# Patient Record
Sex: Female | Born: 1960 | Race: White | Hispanic: No | Marital: Married | Smoking: Former smoker
Health system: Southern US, Community
[De-identification: ages and names within clinical notes are randomized; demographics above are authoritative.]

## PROBLEM LIST (undated history)

## (undated) DIAGNOSIS — Z8719 Personal history of other diseases of the digestive system: Secondary | ICD-10-CM

## (undated) DIAGNOSIS — R42 Dizziness and giddiness: Secondary | ICD-10-CM

## (undated) DIAGNOSIS — F32A Depression, unspecified: Secondary | ICD-10-CM

## (undated) DIAGNOSIS — F419 Anxiety disorder, unspecified: Secondary | ICD-10-CM

## (undated) DIAGNOSIS — E669 Obesity, unspecified: Secondary | ICD-10-CM

## (undated) DIAGNOSIS — D649 Anemia, unspecified: Secondary | ICD-10-CM

## (undated) DIAGNOSIS — K219 Gastro-esophageal reflux disease without esophagitis: Secondary | ICD-10-CM

## (undated) DIAGNOSIS — I10 Essential (primary) hypertension: Secondary | ICD-10-CM

## (undated) DIAGNOSIS — D509 Iron deficiency anemia, unspecified: Secondary | ICD-10-CM

## (undated) DIAGNOSIS — F329 Major depressive disorder, single episode, unspecified: Secondary | ICD-10-CM

## (undated) HISTORY — DX: Anemia, unspecified: D64.9

## (undated) HISTORY — DX: Obesity, unspecified: E66.9

## (undated) HISTORY — DX: Essential (primary) hypertension: I10

## (undated) HISTORY — DX: Depression, unspecified: F32.A

## (undated) HISTORY — DX: Major depressive disorder, single episode, unspecified: F32.9

## (undated) HISTORY — DX: Anxiety disorder, unspecified: F41.9

## (undated) HISTORY — PX: OTHER SURGICAL HISTORY: SHX169

## (undated) HISTORY — PX: ENDOMETRIAL ABLATION W/ NOVASURE: SUR434

---
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
## 1992-03-15 DIAGNOSIS — N823 Fistula of vagina to large intestine: Secondary | ICD-10-CM

## 1992-03-15 HISTORY — PX: VESICO-VAGINAL FISTULA REPAIR: SHX5129

## 1992-03-15 HISTORY — DX: Fistula of vagina to large intestine: N82.3

## 1997-03-15 HISTORY — PX: RECTO-VAGINAL FISSURE REPAIR: SHX5277

## 2002-08-14 HISTORY — PX: OVARIAN CYST SURGERY: SHX726

## 2004-06-17 ENCOUNTER — Ambulatory Visit: Payer: Self-pay | Admitting: Family Medicine

## 2005-05-13 DIAGNOSIS — I10 Essential (primary) hypertension: Secondary | ICD-10-CM

## 2005-05-13 HISTORY — DX: Essential (primary) hypertension: I10

## 2005-06-11 ENCOUNTER — Ambulatory Visit: Payer: Self-pay | Admitting: Family Medicine

## 2005-06-29 ENCOUNTER — Ambulatory Visit: Payer: Self-pay | Admitting: Family Medicine

## 2006-02-08 ENCOUNTER — Encounter: Payer: Self-pay | Admitting: Family Medicine

## 2006-02-16 ENCOUNTER — Encounter: Payer: Self-pay | Admitting: Family Medicine

## 2006-02-16 ENCOUNTER — Ambulatory Visit: Payer: Self-pay | Admitting: Gastroenterology

## 2006-03-29 ENCOUNTER — Ambulatory Visit: Payer: Self-pay | Admitting: Gastroenterology

## 2007-10-14 LAB — CONVERTED CEMR LAB: Pap Smear: NORMAL

## 2008-01-15 ENCOUNTER — Ambulatory Visit: Payer: Self-pay | Admitting: Family Medicine

## 2008-01-15 DIAGNOSIS — I10 Essential (primary) hypertension: Secondary | ICD-10-CM | POA: Insufficient documentation

## 2008-02-07 ENCOUNTER — Ambulatory Visit: Payer: Self-pay | Admitting: Family Medicine

## 2008-02-16 LAB — CONVERTED CEMR LAB
ALT: 15 units/L (ref 0–35)
AST: 15 units/L (ref 0–37)
Albumin: 4.1 g/dL (ref 3.5–5.2)
Alkaline Phosphatase: 65 units/L (ref 39–117)
BUN: 14 mg/dL (ref 6–23)
Basophils Absolute: 0.1 10*3/uL (ref 0.0–0.1)
Basophils Relative: 0.9 % (ref 0.0–3.0)
Bilirubin, Direct: 0.1 mg/dL (ref 0.0–0.3)
CO2: 30 meq/L (ref 19–32)
Calcium: 9.1 mg/dL (ref 8.4–10.5)
Chloride: 106 meq/L (ref 96–112)
Cholesterol: 179 mg/dL (ref 0–200)
Creatinine, Ser: 0.9 mg/dL (ref 0.4–1.2)
Eosinophils Absolute: 0.1 10*3/uL (ref 0.0–0.7)
Eosinophils Relative: 1.7 % (ref 0.0–5.0)
GFR calc Af Amer: 87 mL/min
GFR calc non Af Amer: 72 mL/min
Glucose, Bld: 94 mg/dL (ref 70–99)
HCT: 37 % (ref 36.0–46.0)
HDL: 80.4 mg/dL (ref >39.0)
Hemoglobin: 12.9 g/dL (ref 12.0–15.0)
LDL Cholesterol: 91 mg/dL (ref 0–99)
Lymphocytes Relative: 27.1 % (ref 12.0–46.0)
MCHC: 34.8 g/dL (ref 30.0–36.0)
MCV: 86.8 fL (ref 78.0–100.0)
Monocytes Absolute: 0.3 10*3/uL (ref 0.1–1.0)
Monocytes Relative: 5.3 % (ref 3.0–12.0)
Neutro Abs: 4 10*3/uL (ref 1.4–7.7)
Neutrophils Relative %: 65 % (ref 43.0–77.0)
Platelets: 241 10*3/uL (ref 150–400)
Potassium: 3.9 meq/L (ref 3.5–5.1)
RBC: 4.27 M/uL (ref 3.87–5.11)
RDW: 12.9 % (ref 11.5–14.6)
Sodium: 142 meq/L (ref 135–145)
TSH: 2.02 microintl units/mL (ref 0.35–5.50)
Total Bilirubin: 0.7 mg/dL (ref 0.3–1.2)
Total CHOL/HDL Ratio: 2.2
Total Protein: 7.2 g/dL (ref 6.0–8.3)
Triglycerides: 37 mg/dL (ref 0–149)
VLDL: 7 mg/dL (ref 0–40)
WBC: 6.2 10*3/uL (ref 4.5–10.5)

## 2008-02-28 ENCOUNTER — Ambulatory Visit: Payer: Self-pay | Admitting: Family Medicine

## 2008-02-28 DIAGNOSIS — F341 Dysthymic disorder: Secondary | ICD-10-CM | POA: Insufficient documentation

## 2008-03-15 HISTORY — PX: HYSTEROSCOPY W/ ENDOMETRIAL ABLATION: SUR665

## 2008-05-29 ENCOUNTER — Ambulatory Visit: Payer: Self-pay | Admitting: Family Medicine

## 2008-12-02 ENCOUNTER — Ambulatory Visit: Payer: Self-pay | Admitting: Family Medicine

## 2010-01-20 ENCOUNTER — Ambulatory Visit: Payer: Self-pay | Admitting: Family Medicine

## 2010-02-12 ENCOUNTER — Encounter: Payer: Self-pay | Admitting: Family Medicine

## 2010-03-30 ENCOUNTER — Other Ambulatory Visit: Payer: Self-pay | Admitting: Family Medicine

## 2010-03-30 ENCOUNTER — Ambulatory Visit
Admission: RE | Admit: 2010-03-30 | Discharge: 2010-03-30 | Payer: Self-pay | Source: Home / Self Care | Attending: Family Medicine | Admitting: Family Medicine

## 2010-03-30 ENCOUNTER — Telehealth (INDEPENDENT_AMBULATORY_CARE_PROVIDER_SITE_OTHER): Payer: Self-pay | Admitting: *Deleted

## 2010-03-30 LAB — LIPID PANEL
Cholesterol: 176 mg/dL (ref 0–200)
HDL: 68.2 mg/dL (ref >39.00)
LDL Cholesterol: 92 mg/dL (ref 0–99)
Total CHOL/HDL Ratio: 3
Triglycerides: 78 mg/dL (ref 0.0–149.0)
VLDL: 15.6 mg/dL (ref 0.0–40.0)

## 2010-03-30 LAB — HEPATIC FUNCTION PANEL
ALT: 14 U/L (ref 0–35)
AST: 18 U/L (ref 0–37)
Albumin: 3.8 g/dL (ref 3.5–5.2)
Alkaline Phosphatase: 79 U/L (ref 39–117)
Bilirubin, Direct: 0.1 mg/dL (ref 0.0–0.3)
Total Bilirubin: 0.5 mg/dL (ref 0.3–1.2)
Total Protein: 6.4 g/dL (ref 6.0–8.3)

## 2010-03-30 LAB — CBC WITH DIFFERENTIAL/PLATELET
Basophils Absolute: 0.1 10*3/uL (ref 0.0–0.1)
Basophils Relative: 0.7 % (ref 0.0–3.0)
Eosinophils Absolute: 0.2 10*3/uL (ref 0.0–0.7)
Eosinophils Relative: 2.6 % (ref 0.0–5.0)
HCT: 33.9 % — ABNORMAL LOW (ref 36.0–46.0)
Hemoglobin: 11.3 g/dL — ABNORMAL LOW (ref 12.0–15.0)
Lymphocytes Relative: 25.1 % (ref 12.0–46.0)
Lymphs Abs: 2.1 10*3/uL (ref 0.7–4.0)
MCHC: 33.2 g/dL (ref 30.0–36.0)
MCV: 84.9 fl (ref 78.0–100.0)
Monocytes Absolute: 0.4 10*3/uL (ref 0.1–1.0)
Monocytes Relative: 4.6 % (ref 3.0–12.0)
Neutro Abs: 5.7 10*3/uL (ref 1.4–7.7)
Neutrophils Relative %: 67 % (ref 43.0–77.0)
Platelets: 365 10*3/uL (ref 150.0–400.0)
RBC: 3.99 Mil/uL (ref 3.87–5.11)
RDW: 13.7 % (ref 11.5–14.6)
WBC: 8.4 10*3/uL (ref 4.5–10.5)

## 2010-03-30 LAB — BASIC METABOLIC PANEL
BUN: 13 mg/dL (ref 6–23)
CO2: 26 mEq/L (ref 19–32)
Calcium: 9.1 mg/dL (ref 8.4–10.5)
Chloride: 104 mEq/L (ref 96–112)
Creatinine, Ser: 0.9 mg/dL (ref 0.4–1.2)
GFR: 67.8 mL/min (ref >60.00)
Glucose, Bld: 104 mg/dL — ABNORMAL HIGH (ref 70–99)
Potassium: 4.1 mEq/L (ref 3.5–5.1)
Sodium: 138 mEq/L (ref 135–145)

## 2010-03-30 LAB — TSH: TSH: 1.73 u[IU]/mL (ref 0.35–5.50)

## 2010-04-16 NOTE — Progress Notes (Signed)
----   Converted from flag ---- ---- 03/29/2010 4:05 PM, Judith Part MD wrote: please check lipid and wellness for v70.0 and 401.1 thanks  ---- 03/26/2010 10:47 AM, Liane Comber CMA (AAMA) wrote: Lab orders please! Good Morning! This pt is scheduled for cpx labs Monday, which labs to draw and dx codes to use? Thanks Tasha ------------------------------

## 2010-04-22 ENCOUNTER — Encounter: Payer: Self-pay | Admitting: Family Medicine

## 2010-04-22 ENCOUNTER — Encounter (INDEPENDENT_AMBULATORY_CARE_PROVIDER_SITE_OTHER): Payer: 59 | Admitting: Family Medicine

## 2010-04-22 DIAGNOSIS — D126 Benign neoplasm of colon, unspecified: Secondary | ICD-10-CM

## 2010-04-22 DIAGNOSIS — Z0000 Encounter for general adult medical examination without abnormal findings: Secondary | ICD-10-CM

## 2010-04-22 DIAGNOSIS — D649 Anemia, unspecified: Secondary | ICD-10-CM

## 2010-04-22 DIAGNOSIS — I10 Essential (primary) hypertension: Secondary | ICD-10-CM

## 2010-04-22 DIAGNOSIS — F341 Dysthymic disorder: Secondary | ICD-10-CM

## 2010-04-30 ENCOUNTER — Other Ambulatory Visit: Payer: Self-pay | Admitting: Family Medicine

## 2010-04-30 ENCOUNTER — Other Ambulatory Visit: Payer: 59

## 2010-04-30 ENCOUNTER — Encounter (INDEPENDENT_AMBULATORY_CARE_PROVIDER_SITE_OTHER): Payer: Self-pay | Admitting: *Deleted

## 2010-04-30 DIAGNOSIS — D126 Benign neoplasm of colon, unspecified: Secondary | ICD-10-CM

## 2010-04-30 DIAGNOSIS — D649 Anemia, unspecified: Secondary | ICD-10-CM

## 2010-04-30 NOTE — Letter (Signed)
Summary: [facility]: Immunoassay Fecal Occult Blood (iFOB) Order Form  [place] [facility]  [address] [place], [place] [address]   Phone: [phone]  Fax: [phone]      [facility]: Immunoassay Fecal Occult Blood (iFOB) Order Form   April 22, 2010 MRN: [id number]   Meagan Conway September 02, 1960   Physicican Name:___________Tower______________  Diagnosis Code:___________colon polyp history , anemia 285.9_______________      Judith Part MD

## 2010-05-01 ENCOUNTER — Encounter (INDEPENDENT_AMBULATORY_CARE_PROVIDER_SITE_OTHER): Payer: Self-pay | Admitting: *Deleted

## 2010-05-06 NOTE — Assessment & Plan Note (Signed)
Summary: CPX/ CLE   Vital Signs:  Patient profile:   50 year old female Height:      64.75 inches Weight:      225 pounds BMI:     37.87 Temp:     98.7 degrees F oral Pulse rate:   72 / minute Pulse rhythm:   regular BP sitting:   110 / 64  (left arm) Cuff size:   regular  Vitals Entered By: Lewanda Rife LPN (April 22, 2010 2:22 PM) CC: CPX GYN does pap and breast exam, Back Pain   History of Present Illness: here for wellness exam and to review chronic health problems   had a root canal this am  has headache from that  other than that is doing ok   HTN is stable and well controlled 110/64  gyn exam- Dr Shawnie Pons dec  pap nl   mam-- dec was nl   colonosc 08- polyp and fistula  no more problems with fistula   Td she thinks 11/15/2006  lipids good Last Lipid ProfileCholesterol: 176 (03/30/2010 9:18:40 AM)HDL:  68.20 (03/30/2010 9:18:40 AM)LDL:  92 (03/30/2010 9:18:40 AM)Triglycerides:  Last Liver profileSGOT:  18 (03/30/2010 9:18:40 AM)SPGT:  14 (03/30/2010 9:18:40 AM)T. Bili:  0.5 (03/30/2010 9:18:40 AM)Alk Phos:  79 (03/30/2010 9:18:40 AM)   sugar 104 no thirst or urination  is using herbalife with neighbors  - is careful with this  is not exercising   hb is 11.3 - down a pt from previous  has never been anemic  gave blood in nov  no more peroids  had novosure proceedure over a year ago - no peroids    Allergies (verified): No Known Drug Allergies  Past History:  Past Surgical History: Last updated: 01/15/2008 1994 vaginal fistula repair 08/2002 ovarian cyst 12/2004colonoscopy- fistula (rectal) and polyps  Family History: Last updated: 01/15/2008 mother HTN  father HTN, MI at 3 and ETOH  GF- both CAD   Social History: Last updated: 01/15/2008 1 drink 3-4 times per week non smoker   Past Medical History: (3/07) HTN depression with anxious features obesity does donate blood   Review of Systems General:  Denies chills, fatigue, fever, loss of  appetite, and malaise. Eyes:  Denies blurring and double vision. CV:  Denies chest pain or discomfort, palpitations, and shortness of breath with exertion. Resp:  Denies cough and wheezing. GI:  Denies abdominal pain, change in bowel habits, indigestion, and nausea. GU:  Denies dysuria and urinary frequency. MS:  Denies joint pain, muscle aches, and stiffness. Derm:  Denies itching, lesion(s), poor wound healing, and rash. Neuro:  Complains of headaches; denies poor balance, sensation of room spinning, and tingling. Psych:  Denies anxiety and depression. Endo:  Denies cold intolerance, excessive thirst, excessive urination, and heat intolerance. Heme:  Denies abnormal bruising and bleeding.  Physical Exam  General:  overweight but generally well appearing  Head:  normocephalic, atraumatic, and no abnormalities observed.   Eyes:  vision grossly intact, pupils equal, pupils round, and pupils reactive to light.  no conjunctival pallor, injection or icterus  Ears:  R ear normal and L ear normal.   Nose:  no nasal discharge.   Mouth:  pharynx pink and moist.   Neck:  supple with full rom and no masses or thyromegally, no JVD or carotid bruit  Chest Wall:  No deformities, masses, or tenderness noted. Lungs:  Normal respiratory effort, chest expands symmetrically. Lungs are clear to auscultation, no crackles or wheezes. Heart:  Normal  rate and regular rhythm. S1 and S2 normal without gallop, murmur, click, rub or other extra sounds. Abdomen:  Bowel sounds positive,abdomen soft and non-tender without masses, organomegaly or hernias noted. no renal bruits  Msk:  No deformity or scoliosis noted of thoracic or lumbar spine.  no acute joint changes  Pulses:  R and L carotid,radial,femoral,dorsalis pedis and posterior tibial pulses are full and equal bilaterally Extremities:  No clubbing, cyanosis, edema, or deformity noted with normal full range of motion of all joints.   Neurologic:  sensation  intact to light touch, gait normal, and DTRs symmetrical and normal.   Skin:  Intact without suspicious lesions or rashes no pallor or jaundice nl cap refill Cervical Nodes:  No lymphadenopathy noted Inguinal Nodes:  No significant adenopathy Psych:  normal affect, talkative and pleasant    Impression & Recommendations:  Problem # 1:  HEALTH MAINTENANCE EXAM (ICD-V70.0) Assessment Comment Only reviewed health habits including diet, exercise and skin cancer prevention reviewed health maintenance list and family history   Problem # 2:  COLONIC POLYPS (ICD-211.3) Assessment: New hx of  now is anemic  sent for last colonosc- ? due for next  Problem # 3:  ANXIETY DEPRESSION (ICD-300.4) Assessment: Improved doing better off fluoxetine  will continue to monitor   Problem # 4:  ESSENTIAL HYPERTENSION (ICD-401.9) Assessment: Unchanged  HTN is very well controlled refilled ace/ hct  disc healthy diet (low simple sugar/ choose complex carbs/ low sat fat) diet and exercise in detail - disc plan for wt loss  Her updated medication list for this problem includes:    Zestoretic 10-12.5 Mg Tabs (Lisinopril-hydrochlorothiazide) .Marland Kitchen... 1 by mouth once daily in am  BP today: 110/64 Prior BP: 120/82 (12/02/2008)  Labs Reviewed: K+: 4.1 (03/30/2010) Creat: : 0.9 (03/30/2010)   Chol: 176 (03/30/2010)   HDL: 68.20 (03/30/2010)   LDL: 92 (03/30/2010)   TG: 78.0 (03/30/2010)  Orders: Prescription Created Electronically [phone])  Problem # 5:  OBESITY (ICD-278.00) Assessment: Unchanged I recommend wt watchers and 5 d exercise weekly instead of supplements or herbalife  Problem # 6:  UNSPECIFIED ANEMIA (ICD-285.9) anemia -- new and mild  suspect may be rel to blood donation  also hx of colon polyps- sent for last colonosc will do immunoassay card  then start ferrous sulfate 325 daily and re check cbc in 6 wk  Complete Medication List: 1)  Zestoretic 10-12.5 Mg Tabs  (Lisinopril-hydrochlorothiazide) .Marland Kitchen.. 1 by mouth once daily in am  Patient Instructions: 1)  please send for last colonosc and note -- [facility] - Dr ? Lars Pinks  2)  do stool card and get back to [place]  3)  then start ferrous sulfate (iron ) appox 325 mg daily over the counter for anemia (you may need a stool softener with this - that is ok )  4)  re check cbc with diff in 6 weeks for anemia (285.9) Prescriptions: ZESTORETIC 10-12.5 MG TABS (LISINOPRIL-HYDROCHLOROTHIAZIDE) 1 by mouth once daily in am  #30 x 11   Entered and Authorized by:   Judith Part MD   Signed by:   Judith Part MD on 04/22/2010   Method used:   Electronically to        [facility] (retail)       [address], [address]       [place], [place]  [address]       Ph: [phone]  Fax: [phone]   RxID:   [id number]    Orders Added: 1)  Prescription Created Electronically [G8553] 2)  Est. Patient 40-64 years [30865] 3)  Est. Patient Level II [78469]    Current Allergies (reviewed today): No known allergies    Preventive Care Screening  Mammogram:    Date:  02/12/2010    Results:  normal   Pap Smear:    Date:  02/12/2010    Results:  normal   Last Tetanus Booster:    Date:  11/15/2006    Results:  Td

## 2010-05-06 NOTE — Letter (Signed)
Summary: Generic Letter  [place] at [facility]  [address] [place], [place] [address]   Phone: [phone]  Fax: [phone]    05/01/2010    ANAIA FRITH [address] [place], [place]  [address]  [place]     Dear Ms. Spiers,    Your stool test were normal. Please take Iron 325mg  daily as instructed by Dr. Milinda Antis for anemia. You may need to take a stool softner with this supplement.       Sincerely,   Liane Comber CMA (AAMA)

## 2010-05-12 NOTE — Letter (Signed)
Summary: Midge Minium MD  Midge Minium MD   Imported By: Lanelle Bal 04/29/2010 13:52:53  _____________________________________________________________________  External Attachment:    Type:   Image     Comment:   External Document

## 2010-05-12 NOTE — Procedures (Signed)
Summary: Colonoscopy/[facility]  Colonoscopy/[facility]   Imported By: Lanelle Bal 04/29/2010 13:49:18  _____________________________________________________________________  External Attachment:    Type:   Image     Comment:   External Document

## 2010-06-03 ENCOUNTER — Other Ambulatory Visit: Payer: 59

## 2010-06-03 DIAGNOSIS — D649 Anemia, unspecified: Secondary | ICD-10-CM

## 2010-06-04 LAB — CBC WITH DIFFERENTIAL/PLATELET
Basophils Absolute: 0.1 10*3/uL (ref 0.0–0.1)
HCT: 33.4 % — ABNORMAL LOW (ref 36.0–46.0)
Hemoglobin: 11.3 g/dL — ABNORMAL LOW (ref 12.0–15.0)
Lymphs Abs: 1.9 10*3/uL (ref 0.7–4.0)
MCHC: 33.9 g/dL (ref 30.0–36.0)
Monocytes Relative: 3.1 % (ref 3.0–12.0)
Neutro Abs: 6.8 10*3/uL (ref 1.4–7.7)
RDW: 16.3 % — ABNORMAL HIGH (ref 11.5–14.6)

## 2010-07-28 NOTE — Assessment & Plan Note (Signed)
NAME:  Meagan Conway, Meagan Conway NO.:  [id number]   MEDICAL RECORD NO.:  [id number]          PATIENT TYPE:  POB   LOCATION:  [facility]         FACILITY:  [facility]   PHYSICIAN:  Tinnie Gens, MD        DATE OF BIRTH:  01/20/1961   DATE OF SERVICE:                                  CLINIC NOTE   CHIEF COMPLAINT:  Physical exam.   HISTORY OF PRESENT ILLNESS:  The patient is a 50 year old gravida 2,  para 2 who has a history of hypertension who sees Dr. Milinda Antis as her PCP.  She comes in today for physical exam.  She had a NovaSure endometrial  ablation in 2010 and has had no cycle since then.  She has no other  complaints today.  Her past medical history is significant for acid  reflux and hypertension.   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  She continues to have issues with this once in a  while.  The patient is on lisinopril and hydrochlorothiazide combination  10/12.5 mg p.o. daily and fluoxetine 40 mg p.o. daily.   ALLERGIES:  None known.   OBSTETRICAL HISTORY:  G2, P2, two vaginal deliveries.  She has a child  of 9 and one of 13.   GYN HISTORY:  Menarche at age 8.  No cycle since NovaSure, November  2010.  Her husband had vasectomy.  She has a history of ovarian cyst,  but no history of abnormal Pap smears.  Last Pap was in August 2010.   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  She lives with her husband and her 18 year old son.  She  drinks approximately 4 alcoholic beverages per week.  She used marijuana  in high school and has not used any other drugs since then.  She denies  tobacco use.   FAMILY HISTORY:  Significant for diabetes for heart disease and  hypertension.   REVIEW OF SYSTEMS:  A 14-point review of systems is reviewed.  She  complains of headaches on occasion, which is  relieved with ibuprofen.  Please see GYN history on the chart for full details.  The patient does  complain of weight gain and would like to know if there is a magic diet  pill.   PHYSICAL EXAMINATION:  GENERAL:  Today, she is an obese female in no  acute distress.  VITAL SIGNS:  Blood pressure is 123/85, pulse is 61, weight is 225  pounds.  HEENT:  Normocephalic, atraumatic.  Sclerae anicteric.  NECK:  Supple.  Normal thyroid.  LUNGS:  Clear bilaterally.  CV:  Regular rate and rhythm without rubs, gallops, or murmurs.  ABDOMEN:  Soft, nontender, and nondistended.  EXTREMITIES:  No cyanosis, clubbing, or edema.  Distal pulses 2+.  BREASTS:  Symmetric with everted nipples.  No masses.  No  supraclavicular or axillary adenopathy.  GU:  Normal external female genitalia.  BUS is normal.  Vagina is pink  and rugated.  Cervix is parous without lesion.  Uterus is small,  anteverted.  No adnexal mass or tenderness, although exam is somewhat  limited by body habitus.   IMPRESSION:  1. Yearly exam with physical.  2. Obesity.  3. Hypertension.  4. Depression.  5. History of rectovaginal fistula.   PLAN:  1. Pap smear today.  2. Order mammogram screening.  3. Continue followup with Dr. Milinda Antis for all PCP needs.  4. Discussed exercise, diet, portion control, and making healthier      substitutions.  5. The patient will follow up in 1 year for her next annual exam.           ______________________________  Tinnie Gens, MD     TP/MEDQ  D:  01/20/2010  T:  01/21/2010  Job:  [id number]

## 2010-08-01 ENCOUNTER — Encounter: Payer: Self-pay | Admitting: Family Medicine

## 2010-08-12 ENCOUNTER — Encounter: Payer: Self-pay | Admitting: Family Medicine

## 2010-08-12 ENCOUNTER — Ambulatory Visit (INDEPENDENT_AMBULATORY_CARE_PROVIDER_SITE_OTHER): Payer: 59 | Admitting: Family Medicine

## 2010-08-12 VITALS — BP 136/84 | HR 64 | Temp 98.3°F | Ht 64.75 in | Wt 229.0 lb

## 2010-08-12 DIAGNOSIS — D649 Anemia, unspecified: Secondary | ICD-10-CM

## 2010-08-12 NOTE — Patient Instructions (Signed)
We will update you when your labs return  Continue iron and B12 for now  udpate me if any changes

## 2010-08-12 NOTE — Progress Notes (Signed)
Subjective:    Patient ID: Meagan Conway, female    DOB: [birth year], 50 y.o.   MRN: [id number]  HPI Here for f/u of anemia   Prev hb had gone from over 12 to 11.3 Started FeSo4 325 mg daily with some imp in fatigue Also takes 1000 mcg of B12 daily  Some days more energy than others  Lab Results  Component Value Date   WBC 9.2 06/03/2010   HGB 11.3* 06/03/2010   HCT 33.4* 06/03/2010   MCV 80.6 06/03/2010   PLT 333.0 06/03/2010    No blood donation since last November  Iron is not bothering her stomach  Also taking vit B12  Is eating a balanced diet  Stool card neg  colonosc 07  peroids -- gone - with novosure - none since nov 2010  Possible that her mother has had anemia   No abd pain or blood in stool  Dark stools from her iron   Nothing new medically   Past Medical History  Diagnosis Date  . Hypertension 05/2005  . Depression     with anxious features  . Obesity     History   Social History  . Marital Status: Married    Spouse Name: N/A    Number of Children: N/A  . Years of Education: N/A   Occupational History  . Not on file.   Social History Main Topics  . Smoking status: Former Games developer  . Smokeless tobacco: Not on file  . Alcohol Use: Yes     3-4 times per week  . Drug Use: Not on file  . Sexually Active: Not on file   Other Topics Concern  . Not on file   Social History Narrative  . No narrative on file       Review of Systems Review of Systems  Constitutional: Negative for fever, appetite change, and unexpected weight change. pos for intermittent fatigue Eyes: Negative for pain and visual disturbance.  Respiratory: Negative for cough and shortness of breath.   Cardiovascular: Negative for cp or palpitations or edema.   Gastrointestinal: Negative for nausea, diarrhea and constipation.  Genitourinary: Negative for urgency and frequency.  Skin: Negative for pallor.  Neurological: Negative for weakness, light-headedness, numbness and  headaches.  Hematological: Negative for adenopathy. Does not bruise/bleed easily.  Psychiatric/Behavioral: Negative for dysphoric mood. The patient is not nervous/anxious.          Objective:   Physical Exam  Constitutional: She appears well-developed and well-nourished. No distress.       Obese and well appearing   HENT:  Head: Normocephalic and atraumatic.  Mouth/Throat: Oropharynx is clear and moist.  Eyes: Conjunctivae and EOM are normal. Pupils are equal, round, and reactive to light.       No conj pallor  Neck: Normal range of motion. Neck supple. No JVD present. Carotid bruit is not present. No thyromegaly present.  Cardiovascular: Normal rate, regular rhythm, normal heart sounds and intact distal pulses.   Abdominal: Soft. Bowel sounds are normal. She exhibits no distension and no mass. There is no tenderness.       No HSM  Musculoskeletal: She exhibits no edema and no tenderness.  Lymphadenopathy:    She has no cervical adenopathy.  Neurological: She is alert. She has normal reflexes. Coordination normal.  Skin: Skin is warm and dry. No rash noted. No erythema. No pallor.  Psychiatric: She has a normal mood and affect.          Assessment &  Plan:

## 2010-08-13 LAB — CBC WITH DIFFERENTIAL/PLATELET
Basophils Absolute: 0.1 10*3/uL (ref 0.0–0.1)
Eosinophils Absolute: 0.1 10*3/uL (ref 0.0–0.7)
HCT: 36.4 % (ref 36.0–46.0)
Hemoglobin: 12.5 g/dL (ref 12.0–15.0)
Lymphs Abs: 2 10*3/uL (ref 0.7–4.0)
MCHC: 34.3 g/dL (ref 30.0–36.0)
MCV: 85.6 fl (ref 78.0–100.0)
Monocytes Absolute: 0.4 10*3/uL (ref 0.1–1.0)
Neutro Abs: 6.2 10*3/uL (ref 1.4–7.7)
Platelets: 283 10*3/uL (ref 150.0–400.0)
RDW: 17.5 % — ABNORMAL HIGH (ref 11.5–14.6)

## 2010-08-13 LAB — VITAMIN B12: Vitamin B-12: 777 pg/mL (ref 211–911)

## 2010-08-14 NOTE — Assessment & Plan Note (Signed)
Iron def and improved energy levels with daily iron suppl and B12 Check labs today  Update if changes Will continue iron for now

## 2010-11-23 ENCOUNTER — Other Ambulatory Visit (INDEPENDENT_AMBULATORY_CARE_PROVIDER_SITE_OTHER): Payer: 59

## 2010-11-23 DIAGNOSIS — D649 Anemia, unspecified: Secondary | ICD-10-CM

## 2010-11-24 LAB — CBC WITH DIFFERENTIAL/PLATELET
Eosinophils Absolute: 0.2 10*3/uL (ref 0.0–0.7)
Eosinophils Relative: 2 % (ref 0.0–5.0)
HCT: 39.2 % (ref 36.0–46.0)
Lymphs Abs: 2 10*3/uL (ref 0.7–4.0)
MCHC: 33.3 g/dL (ref 30.0–36.0)
MCV: 92 fl (ref 78.0–100.0)
Monocytes Absolute: 0.4 10*3/uL (ref 0.1–1.0)
Platelets: 279 10*3/uL (ref 150.0–400.0)
RDW: 13 % (ref 11.5–14.6)
WBC: 9.3 10*3/uL (ref 4.5–10.5)

## 2010-11-24 NOTE — Progress Notes (Signed)
Addended by: Baldomero Lamy on: 11/24/2010 08:31 AM   Modules accepted: Orders

## 2010-11-25 LAB — FERRITIN: Ferritin: 59.4 ng/mL (ref 10.0–291.0)

## 2011-02-16 ENCOUNTER — Encounter: Payer: Self-pay | Admitting: Family Medicine

## 2011-02-16 ENCOUNTER — Ambulatory Visit (INDEPENDENT_AMBULATORY_CARE_PROVIDER_SITE_OTHER): Payer: 59 | Admitting: Family Medicine

## 2011-02-16 VITALS — BP 127/70 | HR 67 | Ht 65.0 in | Wt 219.0 lb

## 2011-02-16 DIAGNOSIS — Z23 Encounter for immunization: Secondary | ICD-10-CM

## 2011-02-16 DIAGNOSIS — Z01419 Encounter for gynecological examination (general) (routine) without abnormal findings: Secondary | ICD-10-CM

## 2011-02-16 MED ORDER — INFLUENZA VIRUS VACC SPLIT PF IM SUSP: 0.5000 mL | Freq: Once | INTRAMUSCULAR | Status: DC

## 2011-02-16 NOTE — Patient Instructions (Signed)

## 2011-02-16 NOTE — Progress Notes (Signed)
  Subjective:     Meagan Conway is a 50 y.o. female and is here for a comprehensive physical exam. The patient reports no problems.  Has no cycle secondary to ablation.  Husband with vasectomy.  Has increasing heartburn.  Stopped her BP med in June, is watching it periodically and it has been normal.  History   Social History  . Marital Status: Married    Spouse Name: N/A    Number of Children: N/A  . Years of Education: N/A   Occupational History  . Not on file.   Social History Main Topics  . Smoking status: Former Games developer  . Smokeless tobacco: Not on file  . Alcohol Use: Yes     3-4 times per week  . Drug Use: No  . Sexually Active: Not on file   Other Topics Concern  . Not on file   Social History Narrative  . No narrative on file   Health Maintenance  Topic Date Due  . Pap Smear  02/07/1981  . Influenza Vaccine  12/14/2010  . Tetanus/tdap  11/14/2016    The following portions of the patient's history were reviewed and updated as appropriate: allergies, current medications, past family history, past medical history, past social history, past surgical history and problem list.  Review of Systems A comprehensive review of systems was negative.   Objective:    BP 127/70  Pulse 67  Ht 5\' 5"  (1.651 m)  Wt 219 lb (99.338 kg)  BMI 36.44 kg/m2 General appearance: alert, cooperative and appears stated age Head: Normocephalic, without obvious abnormality, atraumatic Neck: no adenopathy, supple, symmetrical, trachea midline and thyroid not enlarged, symmetric, no tenderness/mass/nodules Lungs: clear to auscultation bilaterally Breasts: normal appearance, no masses or tenderness Heart: regular rate and rhythm, S1, S2 normal, no murmur, click, rub or gallop Abdomen: soft, non-tender; bowel sounds normal; no masses,  no organomegaly Pelvic: cervix normal in appearance, external genitalia normal, no adnexal masses or tenderness, no cervical motion tenderness, uterus normal  size, shape, and consistency and vagina normal without discharge Extremities: extremities normal, atraumatic, no cyanosis or edema Pulses: 2+ and symmetric Skin: Skin color, texture, turgor normal. No rashes or lesions Lymph nodes: Cervical, supraclavicular, and axillary nodes normal. Neurologic: Grossly normal    Assessment:    Healthy female exam.  BP good off her BP meds GERD     Plan:  Mammogram Does not need pap smear again for 2 years. Flu shot today To see PCP about GERD.   See After Visit Summary for Counseling Recommendations

## 2011-03-10 ENCOUNTER — Encounter: Payer: Self-pay | Admitting: Family Medicine

## 2011-03-10 ENCOUNTER — Ambulatory Visit (INDEPENDENT_AMBULATORY_CARE_PROVIDER_SITE_OTHER): Payer: 59 | Admitting: Family Medicine

## 2011-03-10 DIAGNOSIS — K219 Gastro-esophageal reflux disease without esophagitis: Secondary | ICD-10-CM | POA: Insufficient documentation

## 2011-03-10 DIAGNOSIS — E669 Obesity, unspecified: Secondary | ICD-10-CM

## 2011-03-10 MED ORDER — PANTOPRAZOLE SODIUM 40 MG PO TBEC: 40.0000 mg | DELAYED_RELEASE_TABLET | Freq: Every day | ORAL | Status: DC

## 2011-03-10 NOTE — Patient Instructions (Signed)
Start protonix 40 mg each am  If insurance won't cover that we may need to change to generic prilosec  Avoid spicy food/ fatty food / caffiene/ alcohol  Also work on weight loss Do not eat late at night  You can also elevate head of bed with a few brick

## 2011-03-10 NOTE — Progress Notes (Signed)
Subjective:    Patient ID: Meagan Conway, female    DOB: [birth year], 50 y.o.   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 OBESITY  . ANXIETY DEPRESSION  . ESSENTIAL HYPERTENSION  . COLONIC POLYPS  . UNSPECIFIED ANEMIA  . GERD (gastroesophageal reflux disease)  . Obesity   Past Medical History  Diagnosis Date  . Hypertension 05/2005  . Depression     with anxious features  . Obesity    Past Surgical History  Procedure Date  . Vesico-vaginal fistula repair 1994  . Ovarian cyst surgery 08/2002  . Endometrial ablation w/ novasure    History  Substance Use Topics  . Smoking status: Former Games developer  . Smokeless tobacco: Not on file  . Alcohol Use: Yes     3-4 times per week   Family History  Problem Relation Age of Onset  . Hypertension Mother   . Hypertension Father   . Heart disease Father 62    MI  . Alcohol abuse Father   . Heart disease Maternal Grandmother   . Heart disease Paternal Grandmother    No Known Allergies No current outpatient prescriptions on file prior to visit.   Current Facility-Administered Medications on File Prior to Visit  Medication Dose Route Frequency Provider Last Rate Last Dose  . influenza  inactive virus vaccine (FLUZONE/FLUARIX) injection 0.5 mL  0.5 mL Intramuscular Once Reva Bores, MD             Review of Systems Review of Systems  Constitutional: Negative for fever, appetite change, fatigue and unexpected weight change.  Eyes: Negative for pain and visual disturbance.  ENT neg for sore throat  Respiratory: Negative for cough and shortness of breath.   Cardiovascular: Negative for cp or palpitations    Gastrointestinal: Negative for nausea, diarrhea and constipation. pos for heartburn and indigestion  Genitourinary: Negative for urgency and frequency.  Skin: Negative for pallor or rash   Neurological: Negative for weakness, light-headedness, numbness and headaches.  Hematological: Negative for adenopathy. Does not bruise/bleed easily.  Psychiatric/Behavioral: Negative for dysphoric mood. The patient is not nervous/anxious.          Objective:   Physical Exam  Constitutional: She appears well-developed and well-nourished. No distress.       overwt and well appearing   HENT:  Head: Normocephalic and atraumatic.  Mouth/Throat: Oropharynx is clear and moist.  Eyes: Conjunctivae and EOM are normal. Pupils are equal, round, and reactive to light. No scleral icterus.  Neck: Normal range of motion. Neck supple. No JVD present. Carotid bruit is not present. Erythema present. No thyromegaly present.  Cardiovascular: Normal rate, regular rhythm, normal heart sounds and intact distal pulses.   Pulmonary/Chest: Effort  normal and breath sounds normal. No respiratory distress. She has no wheezes.  Abdominal: Soft. Bowel sounds are normal. She exhibits no distension and no mass. There is no tenderness. There is no rebound and no guarding.  Musculoskeletal: Normal range of motion. She exhibits no edema.  Lymphadenopathy:    She has no cervical adenopathy.  Neurological: She is alert. She has normal reflexes. No cranial nerve deficit. She exhibits normal muscle tone. Coordination normal.  Skin: Skin is warm and dry. No rash noted. No erythema. No pallor.  Psychiatric: She has a  normal mood and affect.          Assessment & Plan:

## 2011-03-11 NOTE — Assessment & Plan Note (Signed)
Recent wt gain with poor diet and exercise  This has likely lead to the re occurrence of her GERD symptoms  Pt is motivated for lifestyle change Disc a detailed plan of reduced calories (through eliminating junk food/ better food choices/ portion control ), and exercise 5 d per week- elliptical and walking  Disc goal wt and other health reasons to get to a target weight

## 2011-03-11 NOTE — Assessment & Plan Note (Signed)
Re occuring due likely to wt gain and stress Will re start protonix - which has worked in past (consider omeprazole if ins does not cover this )  Disc diet changes and given handouts on this also  Will update if symptoms do not resolve within 2 wk  Will disc further at PE -- and make long term plan - with wt loss as well

## 2011-06-04 ENCOUNTER — Other Ambulatory Visit (INDEPENDENT_AMBULATORY_CARE_PROVIDER_SITE_OTHER): Payer: 59

## 2011-06-04 ENCOUNTER — Telehealth: Payer: Self-pay | Admitting: Family Medicine

## 2011-06-04 DIAGNOSIS — Z0000 Encounter for general adult medical examination without abnormal findings: Secondary | ICD-10-CM

## 2011-06-04 DIAGNOSIS — I10 Essential (primary) hypertension: Secondary | ICD-10-CM

## 2011-06-04 DIAGNOSIS — Z13220 Encounter for screening for lipoid disorders: Secondary | ICD-10-CM

## 2011-06-04 LAB — TSH: TSH: 1.12 u[IU]/mL (ref 0.35–5.50)

## 2011-06-04 LAB — COMPREHENSIVE METABOLIC PANEL
ALT: 13 U/L (ref 0–35)
CO2: 28 mEq/L (ref 19–32)
Creatinine, Ser: 0.8 mg/dL (ref 0.4–1.2)
GFR: 76.81 mL/min (ref >60.00)
Total Bilirubin: 0.3 mg/dL (ref 0.3–1.2)

## 2011-06-04 LAB — CBC WITH DIFFERENTIAL/PLATELET
Basophils Relative: 0.8 % (ref 0.0–3.0)
Eosinophils Relative: 2.2 % (ref 0.0–5.0)
Hemoglobin: 13.4 g/dL (ref 12.0–15.0)
Lymphocytes Relative: 26 % (ref 12.0–46.0)
MCHC: 33.8 g/dL (ref 30.0–36.0)
Monocytes Relative: 4.8 % (ref 3.0–12.0)
Neutro Abs: 4.8 10*3/uL (ref 1.4–7.7)
RBC: 4.44 Mil/uL (ref 3.87–5.11)
WBC: 7.2 10*3/uL (ref 4.5–10.5)

## 2011-06-04 NOTE — Telephone Encounter (Signed)
Pe labs

## 2011-06-07 ENCOUNTER — Other Ambulatory Visit: Payer: 59

## 2011-07-02 ENCOUNTER — Encounter: Payer: Self-pay | Admitting: Family Medicine

## 2011-07-02 ENCOUNTER — Ambulatory Visit (INDEPENDENT_AMBULATORY_CARE_PROVIDER_SITE_OTHER): Payer: 59 | Admitting: Family Medicine

## 2011-07-02 VITALS — BP 130/80 | HR 68 | Temp 98.3°F | Wt 221.0 lb

## 2011-07-02 DIAGNOSIS — F341 Dysthymic disorder: Secondary | ICD-10-CM

## 2011-07-02 DIAGNOSIS — E669 Obesity, unspecified: Secondary | ICD-10-CM

## 2011-07-02 DIAGNOSIS — Z0000 Encounter for general adult medical examination without abnormal findings: Secondary | ICD-10-CM

## 2011-07-02 DIAGNOSIS — I10 Essential (primary) hypertension: Secondary | ICD-10-CM

## 2011-07-02 DIAGNOSIS — R21 Rash and other nonspecific skin eruption: Secondary | ICD-10-CM

## 2011-07-02 DIAGNOSIS — D126 Benign neoplasm of colon, unspecified: Secondary | ICD-10-CM

## 2011-07-02 MED ORDER — FLUOXETINE HCL 20 MG PO TABS
20.0000 mg | ORAL_TABLET | Freq: Every day | ORAL | Status: DC
Start: 2011-07-02 — End: 2012-03-06

## 2011-07-02 NOTE — Patient Instructions (Signed)
Update me if or when you are ready to see a counselor  Start back on fluoxetine -- 1/2 pill daily for a week - then go up to one pill per day  If side effects- let me know  Keep exercising Take zyrtec otc 10 mg each evening - for allergies and itchy rash Also keep itchy areas as cool and dry as possible - use some otc cortisone cream (Update if not starting to improve in a week or if worsening  )  Labs are ok  bp is ok

## 2011-07-02 NOTE — Assessment & Plan Note (Addendum)
Mostly in warmer areas - under breasts and abdomen  Looks allergic-but anxiety could play role  Will try zyrtec otc cortisone cream  Keep cool Update

## 2011-07-02 NOTE — Assessment & Plan Note (Signed)
Still controlled off of med/ with exercise Will continue to monitor [facility] wellness labs

## 2011-07-02 NOTE — Assessment & Plan Note (Signed)
Discussed how this problem influences overall health and the risks it imposes  Reviewed plan for weight loss with lower calorie diet (via better food choices and also portion control or program like weight watchers) and exercise building up to or more than 30 minutes 5 days per week including some aerobic activity    

## 2011-07-02 NOTE — Assessment & Plan Note (Signed)
Worse lately- caring for aging mother with personality conflicts Asked her to consider counseling to help get perspecitve/ make a game plan  Also re start fluoxetine 20 mg  Update if worse or side eff F/u in about 3 mo

## 2011-07-02 NOTE — Progress Notes (Signed)
Subjective:    Patient ID: Meagan Conway, female    DOB: 14-Feb-1961, 51 y.o.   MRN: [id number]  HPI Here for health maintenance exam and to review chronic medical problems    Is feeling very anxious lately  Taking care of her mother - only child - she is having some health issues  Is not talking to anyone - never seen a counselor before  Is edgy and feels very stressed in general- especially when talking to her mother  Has dicey relationship going with her anyway Her symptoms are carried over to home as well  Was on fluoxetine in the past - has been over a year    bp is 130/80     Today No cp or palpitations or headaches or edema  Is controlled by lifestyle at this time  Wt is down 5 lb  Pap/gyn care -- has been last November - no abn paps , pap nl was the year before that  endom ablation in past- no menses   mammo 11/12- mammo - nl  Self exam - no lumps or changes   Is having hot flash  colonosc 12/07 polyp  Wellness labs ok  Lab Results  Component Value Date   CHOL 176 03/30/2010   HDL 68.20 03/30/2010   LDLCALC 92 03/30/2010   TRIG 78.0 03/30/2010   CHOLHDL 3 03/30/2010      Chemistry      Component Value Date/Time   NA 141 06/04/2011 0800   K 4.2 06/04/2011 0800   CL 103 06/04/2011 0800   CO2 28 06/04/2011 0800   BUN 16 06/04/2011 0800   CREATININE 0.8 06/04/2011 0800      Component Value Date/Time   CALCIUM 9.7 06/04/2011 0800   ALKPHOS 78 06/04/2011 0800   AST 14 06/04/2011 0800   ALT 13 06/04/2011 0800   BILITOT 0.3 06/04/2011 0800     Lab Results  Component Value Date   WBC 7.2 06/04/2011   HGB 13.4 06/04/2011   HCT 39.5 06/04/2011   MCV 89.0 06/04/2011   PLT 264.0 06/04/2011    Patient Active Problem List  Diagnoses  . ANXIETY DEPRESSION  . ESSENTIAL HYPERTENSION  . COLONIC POLYPS  . UNSPECIFIED ANEMIA  . GERD (gastroesophageal reflux disease)  . Obesity  . Routine general medical examination at a health care facility  . Rash and nonspecific skin  eruption   Past Medical History  Diagnosis Date  . Hypertension 05/2005  . Depression     with anxious features  . Obesity    Past Surgical History  Procedure Date  . Vesico-vaginal fistula repair 1994  . Ovarian cyst surgery 08/2002  . Endometrial ablation w/ novasure    History  Substance Use Topics  . Smoking status: Former Games developer  . Smokeless tobacco: Not on file  . Alcohol Use: Yes     3-4 times per week   Family History  Problem Relation Age of Onset  . Hypertension Mother   . Hypertension Father   . Heart disease Father 71    MI  . Alcohol abuse Father   . Heart disease Maternal Grandmother   . Heart disease Paternal Grandmother    No Known Allergies Current Outpatient Prescriptions on File Prior to Visit  Medication Sig Dispense Refill  . pantoprazole (PROTONIX) 40 MG tablet Take 1 tablet (40 mg total) by mouth daily.  30 tablet  11  . FLUoxetine (PROZAC) 20 MG tablet Take 1 tablet (20 mg total) by  mouth daily.  30 tablet  11      Review of Systems Review of Systems  Constitutional: Negative for fever, appetite change, fatigue and unexpected weight change.  Eyes: Negative for pain and visual disturbance.  Respiratory: Negative for cough and shortness of breath.   Cardiovascular: Negative for cp or palpitations    Gastrointestinal: Negative for nausea, diarrhea and constipation.  Genitourinary: Negative for urgency and frequency.  Skin: Negative for pallor or rash   Neurological: Negative for weakness, light-headedness, numbness and headaches.  Hematological: Negative for adenopathy. Does not bruise/bleed easily.  Psychiatric/Behavioral: pos for anx and dep symptoms with stressors         Objective:   Physical Exam  Constitutional: She appears well-developed and well-nourished. No distress.       Obese and well appearing   HENT:  Head: Normocephalic and atraumatic.  Right Ear: External ear normal.  Left Ear: External ear normal.  Nose: Nose  normal.  Mouth/Throat: Oropharynx is clear and moist. No oropharyngeal exudate.  Eyes: Conjunctivae and EOM are normal. Pupils are equal, round, and reactive to light. No scleral icterus.  Neck: Normal range of motion. Neck supple. No JVD present. No thyromegaly present.  Cardiovascular: Normal rate, regular rhythm, normal heart sounds and intact distal pulses.  Exam reveals no gallop.   Pulmonary/Chest: Effort normal and breath sounds normal. No respiratory distress. She has no wheezes.  Abdominal: Soft. Bowel sounds are normal. She exhibits no distension and no mass. There is no tenderness.  Musculoskeletal: Normal range of motion. She exhibits no edema and no tenderness.  Lymphadenopathy:    She has no cervical adenopathy.  Neurological: She is alert. She has normal reflexes. No cranial nerve deficit. She exhibits normal muscle tone. Coordination normal.  Skin: Skin is warm and dry. Rash noted. No pallor.       Allergic looking papular rash (papules of different sizes) on abd/ under breasts/ at small of back No excoriation or signs of infection  Psychiatric: She has a normal mood and affect.          Assessment & Plan:

## 2011-07-02 NOTE — Assessment & Plan Note (Signed)
Is up to date on colonosc at this time No stool changes

## 2011-07-02 NOTE — Assessment & Plan Note (Signed)
Reviewed health habits including diet and exercise and skin cancer prevention Also reviewed health mt list, fam hx and immunizations   Wellness labs reviewed  

## 2011-08-27 ENCOUNTER — Emergency Department: Payer: Self-pay | Admitting: Emergency Medicine

## 2011-10-01 ENCOUNTER — Encounter: Payer: Self-pay | Admitting: Family Medicine

## 2011-10-01 ENCOUNTER — Ambulatory Visit (INDEPENDENT_AMBULATORY_CARE_PROVIDER_SITE_OTHER): Payer: 59 | Admitting: Family Medicine

## 2011-10-01 VITALS — BP 138/80 | HR 60 | Temp 98.1°F | Ht 65.0 in | Wt 224.2 lb

## 2011-10-01 DIAGNOSIS — I10 Essential (primary) hypertension: Secondary | ICD-10-CM

## 2011-10-01 DIAGNOSIS — F341 Dysthymic disorder: Secondary | ICD-10-CM

## 2011-10-01 NOTE — Assessment & Plan Note (Signed)
Despite large amount of stress, mood is much improved with prozac - less anxious and no longer sad Will continue this dose as long as needed Enc water exercise and outdoor time when able

## 2011-10-01 NOTE — Assessment & Plan Note (Signed)
bp in fair control at this time  No changes needed  Disc lifstyle change with low sodium diet and exercise   Better on 2nd check  Needs to work on weight loss when able - limited exercise due to ankle sprain Will continue to swim
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
## 2011-10-01 NOTE — Patient Instructions (Addendum)
Take care of yourself  I'm glad you are doing better  Continue the prozac 20 mg bp was better on the 2nd try

## 2012-02-29 ENCOUNTER — Ambulatory Visit: Payer: 59 | Admitting: Family Medicine

## 2012-03-06 ENCOUNTER — Encounter: Payer: Self-pay | Admitting: Family Medicine

## 2012-03-06 ENCOUNTER — Ambulatory Visit (INDEPENDENT_AMBULATORY_CARE_PROVIDER_SITE_OTHER): Payer: 59 | Admitting: Family Medicine

## 2012-03-06 VITALS — BP 98/57 | HR 68 | Ht 65.0 in | Wt 234.0 lb

## 2012-03-06 DIAGNOSIS — Z124 Encounter for screening for malignant neoplasm of cervix: Secondary | ICD-10-CM

## 2012-03-06 DIAGNOSIS — Z01419 Encounter for gynecological examination (general) (routine) without abnormal findings: Secondary | ICD-10-CM

## 2012-03-06 DIAGNOSIS — Z1151 Encounter for screening for human papillomavirus (HPV): Secondary | ICD-10-CM

## 2012-03-06 NOTE — Patient Instructions (Signed)
Preventive Care for Adults, Female A healthy lifestyle and preventive care can promote health and wellness. Preventive health guidelines for women include the following key practices.  A routine yearly physical is a good way to check with your caregiver about your health and preventive screening. It is a chance to share any concerns and updates on your health, and to receive a thorough exam.  Visit your dentist for a routine exam and preventive care every 6 months. Brush your teeth twice a day and floss once a day. Good oral hygiene prevents tooth decay and gum disease.  The frequency of eye exams is based on your age, health, family medical history, use of contact lenses, and other factors. Follow your caregiver's recommendations for frequency of eye exams.  Eat a healthy diet. Foods like vegetables, fruits, whole grains, low-fat dairy products, and lean protein foods contain the nutrients you need without too many calories. Decrease your intake of foods high in solid fats, added sugars, and salt. Eat the right amount of calories for you.Get information about a proper diet from your caregiver, if necessary.  Regular physical exercise is one of the most important things you can do for your health. Most adults should get at least 150 minutes of moderate-intensity exercise (any activity that increases your heart rate and causes you to sweat) each week. In addition, most adults need muscle-strengthening exercises on 2 or more days a week.  Maintain a healthy weight. The body mass index (BMI) is a screening tool to identify possible weight problems. It provides an estimate of body fat based on height and weight. Your caregiver can help determine your BMI, and can help you achieve or maintain a healthy weight.For adults 20 years and older:  A BMI below 18.5 is considered underweight.  A BMI of 18.5 to 24.9 is normal.  A BMI of 25 to 29.9 is considered overweight.  A BMI of 30 and above is  considered obese.  Maintain normal blood lipids and cholesterol levels by exercising and minimizing your intake of saturated fat. Eat a balanced diet with plenty of fruit and vegetables. Blood tests for lipids and cholesterol should begin at age 20 and be repeated every 5 years. If your lipid or cholesterol levels are high, you are over 50, or you are at high risk for heart disease, you may need your cholesterol levels checked more frequently.Ongoing high lipid and cholesterol levels should be treated with medicines if diet and exercise are not effective.  If you smoke, find out from your caregiver how to quit. If you do not use tobacco, do not start.  If you are pregnant, do not drink alcohol. If you are breastfeeding, be very cautious about drinking alcohol. If you are not pregnant and choose to drink alcohol, do not exceed 1 drink per day. One drink is considered to be 12 ounces (355 mL) of beer, 5 ounces (148 mL) of wine, or 1.5 ounces (44 mL) of liquor.  Avoid use of street drugs. Do not share needles with anyone. Ask for help if you need support or instructions about stopping the use of drugs.  High blood pressure causes heart disease and increases the risk of stroke. Your blood pressure should be checked at least every 1 to 2 years. Ongoing high blood pressure should be treated with medicines if weight loss and exercise are not effective.  If you are 55 to 49 years old, ask your caregiver if you should take aspirin to prevent strokes.  Diabetes   screening involves taking a blood sample to check your fasting blood sugar level. This should be done once every 3 years, after age 45, if you are within normal weight and without risk factors for diabetes. Testing should be considered at a younger age or be carried out more frequently if you are overweight and have at least 1 risk factor for diabetes.  Breast cancer screening is essential preventive care for women. You should practice "breast  self-awareness." This means understanding the normal appearance and feel of your breasts and may include breast self-examination. Any changes detected, no matter how small, should be reported to a caregiver. Women in their 20s and 30s should have a clinical breast exam (CBE) by a caregiver as part of a regular health exam every 1 to 3 years. After age 40, women should have a CBE every year. Starting at age 40, women should consider having a mammography (breast X-ray test) every year. Women who have a family history of breast cancer should talk to their caregiver about genetic screening. Women at a high risk of breast cancer should talk to their caregivers about having magnetic resonance imaging (MRI) and a mammography every year.  The Pap test is a screening test for cervical cancer. A Pap test can show cell changes on the cervix that might become cervical cancer if left untreated. A Pap test is a procedure in which cells are obtained and examined from the lower end of the uterus (cervix).  Women should have a Pap test starting at age 21.  Between ages 21 and 29, Pap tests should be repeated every 2 years.  Beginning at age 30, you should have a Pap test every 3 years as long as the past 3 Pap tests have been normal.  Some women have medical problems that increase the chance of getting cervical cancer. Talk to your caregiver about these problems. It is especially important to talk to your caregiver if a new problem develops soon after your last Pap test. In these cases, your caregiver may recommend more frequent screening and Pap tests.  The above recommendations are the same for women who have or have not gotten the vaccine for human papillomavirus (HPV).  If you had a hysterectomy for a problem that was not cancer or a condition that could lead to cancer, then you no longer need Pap tests. Even if you no longer need a Pap test, a regular exam is a good idea to make sure no other problems are  starting.  If you are between ages 65 and 70, and you have had normal Pap tests going back 10 years, you no longer need Pap tests. Even if you no longer need a Pap test, a regular exam is a good idea to make sure no other problems are starting.  If you have had past treatment for cervical cancer or a condition that could lead to cancer, you need Pap tests and screening for cancer for at least 20 years after your treatment.  If Pap tests have been discontinued, risk factors (such as a new sexual partner) need to be reassessed to determine if screening should be resumed.  The HPV test is an additional test that may be used for cervical cancer screening. The HPV test looks for the virus that can cause the cell changes on the cervix. The cells collected during the Pap test can be tested for HPV. The HPV test could be used to screen women aged 30 years and older, and should   be used in women of any age who have unclear Pap test results. After the age of 30, women should have HPV testing at the same frequency as a Pap test.  Colorectal cancer can be detected and often prevented. Most routine colorectal cancer screening begins at the age of 50 and continues through age 75. However, your caregiver may recommend screening at an earlier age if you have risk factors for colon cancer. On a yearly basis, your caregiver may provide home test kits to check for hidden blood in the stool. Use of a small camera at the end of a tube, to directly examine the colon (sigmoidoscopy or colonoscopy), can detect the earliest forms of colorectal cancer. Talk to your caregiver about this at age 50, when routine screening begins. Direct examination of the colon should be repeated every 5 to 10 years through age 75, unless early forms of pre-cancerous polyps or small growths are found.  Hepatitis C blood testing is recommended for all people born from 1945 through 1965 and any individual with known risks for hepatitis C.  Practice  safe sex. Use condoms and avoid high-risk sexual practices to reduce the spread of sexually transmitted infections (STIs). STIs include gonorrhea, chlamydia, syphilis, trichomonas, herpes, HPV, and human immunodeficiency virus (HIV). Herpes, HIV, and HPV are viral illnesses that have no cure. They can result in disability, cancer, and death. Sexually active women aged 25 and younger should be checked for chlamydia. Older women with new or multiple partners should also be tested for chlamydia. Testing for other STIs is recommended if you are sexually active and at increased risk.  Osteoporosis is a disease in which the bones lose minerals and strength with aging. This can result in serious bone fractures. The risk of osteoporosis can be identified using a bone density scan. Women ages 65 and over and women at risk for fractures or osteoporosis should discuss screening with their caregivers. Ask your caregiver whether you should take a calcium supplement or vitamin D to reduce the rate of osteoporosis.  Menopause can be associated with physical symptoms and risks. Hormone replacement therapy is available to decrease symptoms and risks. You should talk to your caregiver about whether hormone replacement therapy is right for you.  Use sunscreen with sun protection factor (SPF) of 30 or more. Apply sunscreen liberally and repeatedly throughout the day. You should seek shade when your shadow is shorter than you. Protect yourself by wearing long sleeves, pants, a wide-brimmed hat, and sunglasses year round, whenever you are outdoors.  Once a month, do a whole body skin exam, using a mirror to look at the skin on your back. Notify your caregiver of new moles, moles that have irregular borders, moles that are larger than a pencil eraser, or moles that have changed in shape or color.  Stay current with required immunizations.  Influenza. You need a dose every fall (or winter). The composition of the flu vaccine  changes each year, so being vaccinated once is not enough.  Pneumococcal polysaccharide. You need 1 to 2 doses if you smoke cigarettes or if you have certain chronic medical conditions. You need 1 dose at age 65 (or older) if you have never been vaccinated.  Tetanus, diphtheria, pertussis (Tdap, Td). Get 1 dose of Tdap vaccine if you are younger than age 65, are over 65 and have contact with an infant, are a healthcare worker, are pregnant, or simply want to be protected from whooping cough. After that, you need a Td   booster dose every 10 years. Consult your caregiver if you have not had at least 3 tetanus and diphtheria-containing shots sometime in your life or have a deep or dirty wound.  HPV. You need this vaccine if you are a woman age 26 or younger. The vaccine is given in 3 doses over 6 months.  Measles, mumps, rubella (MMR). You need at least 1 dose of MMR if you were born in 1957 or later. You may also need a second dose.  Meningococcal. If you are age 19 to 21 and a first-year college student living in a residence [place], or have one of several medical conditions, you need to get vaccinated against meningococcal disease. You may also need additional booster doses.  Zoster (shingles). If you are age 60 or older, you should get this vaccine.  Varicella (chickenpox). If you have never had chickenpox or you were vaccinated but received only 1 dose, talk to your caregiver to find out if you need this vaccine.  Hepatitis A. You need this vaccine if you have a specific risk factor for hepatitis A virus infection or you simply wish to be protected from this disease. The vaccine is usually given as 2 doses, 6 to 18 months apart.  Hepatitis B. You need this vaccine if you have a specific risk factor for hepatitis B virus infection or you simply wish to be protected from this disease. The vaccine is given in 3 doses, usually over 6 months. Preventive Services / Frequency Ages 19 to 39  Blood  pressure check.** / Every 1 to 2 years.  Lipid and cholesterol check.** / Every 5 years beginning at age 20.  Clinical breast exam.** / Every 3 years for women in their 20s and 30s.  Pap test.** / Every 2 years from ages 21 through 29. Every 3 years starting at age 30 through age 65 or 70 with a history of 3 consecutive normal Pap tests.  HPV screening.** / Every 3 years from ages 30 through ages 65 to 70 with a history of 3 consecutive normal Pap tests.  Hepatitis C blood test.** / For any individual with known risks for hepatitis C.  Skin self-exam. / Monthly.  Influenza immunization.** / Every year.  Pneumococcal polysaccharide immunization.** / 1 to 2 doses if you smoke cigarettes or if you have certain chronic medical conditions.  Tetanus, diphtheria, pertussis (Tdap, Td) immunization. / A one-time dose of Tdap vaccine. After that, you need a Td booster dose every 10 years.  HPV immunization. / 3 doses over 6 months, if you are 26 and younger.  Measles, mumps, rubella (MMR) immunization. / You need at least 1 dose of MMR if you were born in 1957 or later. You may also need a second dose.  Meningococcal immunization. / 1 dose if you are age 19 to 21 and a first-year college student living in a residence [place], or have one of several medical conditions, you need to get vaccinated against meningococcal disease. You may also need additional booster doses.  Varicella immunization.** / Consult your caregiver.  Hepatitis A immunization.** / Consult your caregiver. 2 doses, 6 to 18 months apart.  Hepatitis B immunization.** / Consult your caregiver. 3 doses usually over 6 months. Ages 40 to 64  Blood pressure check.** / Every 1 to 2 years.  Lipid and cholesterol check.** / Every 5 years beginning at age 20.  Clinical breast exam.** / Every year after age 40.  Mammogram.** / Every year beginning at age 40   and continuing for as long as you are in good health. Consult with your  caregiver.  Pap test.** / Every 3 years starting at age 30 through age 65 or 70 with a history of 3 consecutive normal Pap tests.  HPV screening.** / Every 3 years from ages 30 through ages 65 to 70 with a history of 3 consecutive normal Pap tests.  Fecal occult blood test (FOBT) of stool. / Every year beginning at age 50 and continuing until age 75. You may not need to do this test if you get a colonoscopy every 10 years.  Flexible sigmoidoscopy or colonoscopy.** / Every 5 years for a flexible sigmoidoscopy or every 10 years for a colonoscopy beginning at age 50 and continuing until age 75.  Hepatitis C blood test.** / For all people born from 1945 through 1965 and any individual with known risks for hepatitis C.  Skin self-exam. / Monthly.  Influenza immunization.** / Every year.  Pneumococcal polysaccharide immunization.** / 1 to 2 doses if you smoke cigarettes or if you have certain chronic medical conditions.  Tetanus, diphtheria, pertussis (Tdap, Td) immunization.** / A one-time dose of Tdap vaccine. After that, you need a Td booster dose every 10 years.  Measles, mumps, rubella (MMR) immunization. / You need at least 1 dose of MMR if you were born in 1957 or later. You may also need a second dose.  Varicella immunization.** / Consult your caregiver.  Meningococcal immunization.** / Consult your caregiver.  Hepatitis A immunization.** / Consult your caregiver. 2 doses, 6 to 18 months apart.  Hepatitis B immunization.** / Consult your caregiver. 3 doses, usually over 6 months. Ages 65 and over  Blood pressure check.** / Every 1 to 2 years.  Lipid and cholesterol check.** / Every 5 years beginning at age 20.  Clinical breast exam.** / Every year after age 40.  Mammogram.** / Every year beginning at age 40 and continuing for as long as you are in good health. Consult with your caregiver.  Pap test.** / Every 3 years starting at age 30 through age 65 or 70 with a 3  consecutive normal Pap tests. Testing can be stopped between 65 and 70 with 3 consecutive normal Pap tests and no abnormal Pap or HPV tests in the past 10 years.  HPV screening.** / Every 3 years from ages 30 through ages 65 or 70 with a history of 3 consecutive normal Pap tests. Testing can be stopped between 65 and 70 with 3 consecutive normal Pap tests and no abnormal Pap or HPV tests in the past 10 years.  Fecal occult blood test (FOBT) of stool. / Every year beginning at age 50 and continuing until age 75. You may not need to do this test if you get a colonoscopy every 10 years.  Flexible sigmoidoscopy or colonoscopy.** / Every 5 years for a flexible sigmoidoscopy or every 10 years for a colonoscopy beginning at age 50 and continuing until age 75.  Hepatitis C blood test.** / For all people born from 1945 through 1965 and any individual with known risks for hepatitis C.  Osteoporosis screening.** / A one-time screening for women ages 65 and over and women at risk for fractures or osteoporosis.  Skin self-exam. / Monthly.  Influenza immunization.** / Every year.  Pneumococcal polysaccharide immunization.** / 1 dose at age 65 (or older) if you have never been vaccinated.  Tetanus, diphtheria, pertussis (Tdap, Td) immunization. / A one-time dose of Tdap vaccine if you are over   65 and have contact with an infant, are a healthcare worker, or simply want to be protected from whooping cough. After that, you need a Td booster dose every 10 years.  Varicella immunization.** / Consult your caregiver.  Meningococcal immunization.** / Consult your caregiver.  Hepatitis A immunization.** / Consult your caregiver. 2 doses, 6 to 18 months apart.  Hepatitis B immunization.** / Check with your caregiver. 3 doses, usually over 6 months. ** Family history and personal history of risk and conditions may change your caregiver's recommendations. Document Released: 04/27/2001 Document Revised: 05/24/2011  Document Reviewed: 07/27/2010 [employer] Patient Information 2013 [employer].  

## 2012-03-06 NOTE — Progress Notes (Signed)
  Subjective:     Meagan Conway is a 51 y.o. female and is here for a comprehensive physical exam. The patient reports no problems.  Had flu shot and labs with PCP.  Needs mammogram.  History   Social History  . Marital Status: Married    Spouse Name: N/A    Number of Children: N/A  . Years of Education: N/A   Occupational History  . Not on file.   Social History Main Topics  . Smoking status: Former Games developer  . Smokeless tobacco: Not on file     Comment: quit many years ago.  . Alcohol Use: Yes     Comment: 3-4 times per week  . Drug Use: No  . Sexually Active: Yes -- Female partner(s)    Birth Control/ Protection: None     Comment: husband has a vasectomy   Other Topics Concern  . Not on file   Social History Narrative  . No narrative on file   Health Maintenance  Topic Date Due  . Influenza Vaccine  11/14/2011  . Pap Smear  01/13/2014  . Tetanus/tdap  11/14/2016    The following portions of the patient's history were reviewed and updated as appropriate: allergies, current medications, past family history, past medical history, past social history, past surgical history and problem list.  Review of Systems A comprehensive review of systems was negative.   Objective:    BP 98/57  Pulse 68  Ht 5\' 5"  (1.651 m)  Wt 234 lb (106.142 kg)  BMI 38.94 kg/m2 General appearance: alert, cooperative and appears stated age Head: Normocephalic, without obvious abnormality, atraumatic Eyes: negative findings: conjunctivae and sclerae normal Neck: no adenopathy, supple, symmetrical, trachea midline and thyroid not enlarged, symmetric, no tenderness/mass/nodules Lungs: clear to auscultation bilaterally Breasts: normal appearance, no masses or tenderness Heart: regular rate and rhythm, S1, S2 normal, no murmur, click, rub or gallop Abdomen: soft, non-tender; bowel sounds normal; no masses,  no organomegaly Pelvic: cervix normal in appearance, external genitalia normal, no adnexal  masses or tenderness, no cervical motion tenderness, uterus normal size, shape, and consistency and vagina normal without discharge Extremities: extremities normal, atraumatic, no cyanosis or edema Pulses: 2+ and symmetric Skin: Skin color, texture, turgor normal. No rashes or lesions Lymph nodes: Cervical, supraclavicular, and axillary nodes normal. Neurologic: Grossly normal    Assessment:    Healthy female exam. Obesity     Plan:  Pap smear Mammogram.   See After Visit Summary for Counseling Recommendations

## 2012-03-07 ENCOUNTER — Other Ambulatory Visit: Payer: Self-pay | Admitting: Family Medicine

## 2012-03-20 ENCOUNTER — Encounter: Payer: Self-pay | Admitting: Family Medicine

## 2012-04-09 ENCOUNTER — Other Ambulatory Visit: Payer: Self-pay | Admitting: Family Medicine

## 2012-06-26 ENCOUNTER — Telehealth: Payer: Self-pay | Admitting: Family Medicine

## 2012-06-26 DIAGNOSIS — Z0000 Encounter for general adult medical examination without abnormal findings: Secondary | ICD-10-CM

## 2012-06-26 NOTE — Telephone Encounter (Signed)
Message copied by Judy Pimple on Mon Jun 26, 2012 10:18 PM ------      Message from: Alvina Chou      Created: Thu Jun 22, 2012 12:03 PM      Regarding: Lab orders for Tuesday, 4.15.14       Patient is scheduled for CPX labs, please order future labs, Thanks , Terri       ------

## 2012-06-27 ENCOUNTER — Other Ambulatory Visit (INDEPENDENT_AMBULATORY_CARE_PROVIDER_SITE_OTHER): Payer: 59

## 2012-06-27 DIAGNOSIS — Z0000 Encounter for general adult medical examination without abnormal findings: Secondary | ICD-10-CM

## 2012-06-27 LAB — COMPREHENSIVE METABOLIC PANEL
Albumin: 4 g/dL (ref 3.5–5.2)
BUN: 9 mg/dL (ref 6–23)
CO2: 25 mEq/L (ref 19–32)
Calcium: 9 mg/dL (ref 8.4–10.5)
GFR: 72.47 mL/min (ref >60.00)
Glucose, Bld: 107 mg/dL — ABNORMAL HIGH (ref 70–99)
Potassium: 3.7 mEq/L (ref 3.5–5.1)
Sodium: 138 mEq/L (ref 135–145)
Total Protein: 7 g/dL (ref 6.0–8.3)

## 2012-06-27 LAB — CBC WITH DIFFERENTIAL/PLATELET
Basophils Relative: 0.6 % (ref 0.0–3.0)
Eosinophils Relative: 1.3 % (ref 0.0–5.0)
HCT: 38.5 % (ref 36.0–46.0)
MCV: 86.3 fl (ref 78.0–100.0)
Monocytes Relative: 3.7 % (ref 3.0–12.0)
Neutrophils Relative %: 74.1 % (ref 43.0–77.0)
Platelets: 301 10*3/uL (ref 150.0–400.0)
RBC: 4.46 Mil/uL (ref 3.87–5.11)
WBC: 8.2 10*3/uL (ref 4.5–10.5)

## 2012-06-27 LAB — TSH: TSH: 1.9 u[IU]/mL (ref 0.35–5.50)

## 2012-06-27 LAB — LIPID PANEL
Cholesterol: 179 mg/dL (ref 0–200)
Triglycerides: 97 mg/dL (ref 0.0–149.0)

## 2012-07-03 ENCOUNTER — Encounter: Payer: Self-pay | Admitting: Family Medicine

## 2012-07-03 ENCOUNTER — Ambulatory Visit (INDEPENDENT_AMBULATORY_CARE_PROVIDER_SITE_OTHER): Payer: BC Managed Care – PPO | Admitting: Family Medicine

## 2012-07-03 VITALS — BP 116/76 | HR 68 | Temp 97.8°F | Ht 64.75 in | Wt 236.0 lb

## 2012-07-03 DIAGNOSIS — Z0000 Encounter for general adult medical examination without abnormal findings: Secondary | ICD-10-CM

## 2012-07-03 DIAGNOSIS — I10 Essential (primary) hypertension: Secondary | ICD-10-CM

## 2012-07-03 MED ORDER — PANTOPRAZOLE SODIUM 40 MG PO TBEC: 40.0000 mg | DELAYED_RELEASE_TABLET | Freq: Every day | ORAL | Status: DC

## 2012-07-03 MED ORDER — FLUOXETINE HCL 20 MG PO CAPS: 20.0000 mg | ORAL_CAPSULE | Freq: Every day | ORAL | Status: DC

## 2012-07-03 NOTE — Assessment & Plan Note (Signed)
This is controlled with lifestyle currently BP: 116/76 mmHg   Enc further walking for exercise

## 2012-07-03 NOTE — Assessment & Plan Note (Signed)
Reviewed health habits including diet and exercise and skin cancer prevention Also reviewed health mt list, fam hx and immunizations  Wellness labs rev Urged to loose wt to reduce risk of DM - since fasting sugar is mildly elevated

## 2012-07-03 NOTE — Progress Notes (Signed)
Subjective:    Patient ID: Meagan Conway, female    DOB: 02-Jul-1960, 52 y.o.   MRN: [id number]  HPI Here for health maintenance exam and to review chronic medical problems    Wt is up 2 lb with bmi of 39  Pap 12/13 Dr Shawnie Pons Mammogram 1/14 No periods -had novosure occ hot flash Self breast exam -no lumps   Flu vaccine- did get in the fall   Td 08  bp is stable today  No cp or palpitations or headaches or edema  No side effects to medicines  BP Readings from Last 3 Encounters:  07/03/12 116/76  03/06/12 98/57  10/01/11 138/80     Mood- is doing pretty good with that - the prozac works well and she stays motivated   Glucose fasting 107 She is a sweet eater  Not a lot of sweetened beverages  A little exercise - is starting to walk -- her husband is training for a 90 mile hike  Tries to walk every night   Lab Results  Component Value Date   CHOL 179 06/27/2012   HDL 60.70 06/27/2012   LDLCALC 99 06/27/2012   TRIG 97.0 06/27/2012   CHOLHDL 3 06/27/2012   is very good with that  Thinks it is luck - some fried foods   Patient Active Problem List  Diagnosis  . ANXIETY DEPRESSION  . ESSENTIAL HYPERTENSION  . COLONIC POLYPS  . UNSPECIFIED ANEMIA  . GERD (gastroesophageal reflux disease)  . Obesity  . Routine general medical examination at a health care facility   Past Medical History  Diagnosis Date  . Hypertension 05/2005  . Depression     with anxious features  . Obesity    Past Surgical History  Procedure Laterality Date  . Vesico-vaginal fistula repair  1994  . Ovarian cyst surgery  08/2002  . Endometrial ablation w/ novasure     History  Substance Use Topics  . Smoking status: Former Games developer  . Smokeless tobacco: Not on file     Comment: quit many years ago.  . Alcohol Use: Yes     Comment: 3-4 times per week   Family History  Problem Relation Age of Onset  . Hypertension Mother   . Hypertension Father   . Heart disease Father 15    MI  .  Alcohol abuse Father   . Heart disease Maternal Grandmother   . Heart disease Paternal Grandmother    No Known Allergies Current Outpatient Prescriptions on File Prior to Visit  Medication Sig Dispense Refill  . FLUoxetine (PROZAC) 20 MG capsule Take 20 mg by mouth daily.      . pantoprazole (PROTONIX) 40 MG tablet TAKE 1 TABLET BY MOUTH EVERY DAY  30 tablet  2   No current facility-administered medications on file prior to visit.      Review of Systems Review of Systems  Constitutional: Negative for fever, appetite change, fatigue and unexpected weight change.  Eyes: Negative for pain and visual disturbance.  Respiratory: Negative for cough and shortness of breath.   Cardiovascular: Negative for cp or palpitations    Gastrointestinal: Negative for nausea, diarrhea and constipation.  Genitourinary: Negative for urgency and frequency.  Skin: Negative for pallor or rash   Neurological: Negative for weakness, light-headedness, numbness and headaches.  Hematological: Negative for adenopathy. Does not bruise/bleed easily.  Psychiatric/Behavioral: Negative for dysphoric mood. The patient is not nervous/anxious.         Objective:   Physical Exam  Constitutional: She appears well-developed and well-nourished. No distress.  obese and well appearing   HENT:  Head: Normocephalic and atraumatic.  Right Ear: External ear normal.  Left Ear: External ear normal.  Nose: Nose normal.  Mouth/Throat: Oropharynx is clear and moist.  Eyes: Conjunctivae and EOM are normal. Pupils are equal, round, and reactive to light. Right eye exhibits no discharge. Left eye exhibits no discharge. No scleral icterus.  Neck: Normal range of motion. Neck supple. No JVD present. Carotid bruit is not present. No thyromegaly present.  Cardiovascular: Normal rate, regular rhythm, normal heart sounds and intact distal pulses.  Exam reveals no gallop.   Pulmonary/Chest: Effort normal and breath sounds normal. No  respiratory distress. She has no wheezes.  Abdominal: Soft. Bowel sounds are normal. She exhibits no distension, no abdominal bruit and no mass. There is no tenderness.  Musculoskeletal: She exhibits no edema and no tenderness.  Lymphadenopathy:    She has no cervical adenopathy.  Neurological: She is alert. She has normal reflexes. No cranial nerve deficit. She exhibits normal muscle tone. Coordination normal.  Skin: Skin is warm and dry. No rash noted. No erythema. No pallor.  Psychiatric: She has a normal mood and affect.          Assessment & Plan:

## 2012-07-03 NOTE — Patient Instructions (Addendum)
No change in medicines  Labs look good but we need to keep an eye on sugar Avoid sweets and sugar drinks as much as you can  Keep walking  Also - weight loss will help decrease risk of diabetes and other health problems

## 2012-08-05 ENCOUNTER — Other Ambulatory Visit: Payer: Self-pay | Admitting: Family Medicine

## 2012-08-08 NOTE — Telephone Encounter (Signed)
Received refill request from [facility] but med was refilled through [facility] (mail order pharmacy) with a years worth of refills on 07/03/12 at her CPE appt, I left voicemail requesting pt to call office to see if she wants med refilled through local pharmacy or mail order pharmacy

## 2012-08-08 NOTE — Telephone Encounter (Signed)
Pt returned my call and said [facility], I declined this refilled and advise pharmacy pt gets it through mail order pharmacy

## 2012-08-17 ENCOUNTER — Emergency Department (HOSPITAL_COMMUNITY): Payer: BC Managed Care – PPO

## 2012-08-17 ENCOUNTER — Encounter (HOSPITAL_COMMUNITY): Payer: Self-pay | Admitting: Emergency Medicine

## 2012-08-17 ENCOUNTER — Telehealth: Payer: Self-pay | Admitting: Family Medicine

## 2012-08-17 ENCOUNTER — Emergency Department (HOSPITAL_COMMUNITY)
Admission: EM | Admit: 2012-08-17 | Discharge: 2012-08-17 | Disposition: A | Discharge status: Home / Self Care | Payer: BC Managed Care – PPO | Attending: Emergency Medicine | Admitting: Emergency Medicine

## 2012-08-17 DIAGNOSIS — I10 Essential (primary) hypertension: Secondary | ICD-10-CM | POA: Insufficient documentation

## 2012-08-17 DIAGNOSIS — Z87891 Personal history of nicotine dependence: Secondary | ICD-10-CM | POA: Insufficient documentation

## 2012-08-17 DIAGNOSIS — E669 Obesity, unspecified: Secondary | ICD-10-CM | POA: Insufficient documentation

## 2012-08-17 DIAGNOSIS — M5412 Radiculopathy, cervical region: Secondary | ICD-10-CM | POA: Insufficient documentation

## 2012-08-17 DIAGNOSIS — F341 Dysthymic disorder: Secondary | ICD-10-CM | POA: Insufficient documentation

## 2012-08-17 DIAGNOSIS — R209 Unspecified disturbances of skin sensation: Secondary | ICD-10-CM | POA: Insufficient documentation

## 2012-08-17 DIAGNOSIS — M5090 Cervical disc disorder, unspecified, unspecified cervical region: Secondary | ICD-10-CM

## 2012-08-17 LAB — POCT I-STAT, CHEM 8
Calcium, Ion: 1.17 mmol/L (ref 1.12–1.23)
Chloride: 107 mEq/L (ref 96–112)
Creatinine, Ser: 0.8 mg/dL (ref 0.50–1.10)
Glucose, Bld: 91 mg/dL (ref 70–99)
Hemoglobin: 13.3 g/dL (ref 12.0–15.0)
Potassium: 4.1 mEq/L (ref 3.5–5.1)

## 2012-08-17 MED ORDER — METHYLPREDNISOLONE 4 MG PO KIT: PACK | ORAL | Status: DC

## 2012-08-17 NOTE — ED Provider Notes (Addendum)
History     CSN: [id number]  Arrival date & time 08/17/12  1325   First MD Initiated Contact with Patient 08/17/12 1343      Chief Complaint  Patient presents with  . Weakness    (Consider location/radiation/quality/duration/timing/severity/associated sxs/prior treatment) HPI Comments: Patient was sitting at her computer today typing when she developed numbness and tingling in her left arm that starts around the elbow and goes down into her fingers. She states slightly improved from when it started but has not resolved. She denies any headache, dizziness, nausea, chest pain, shortness of breath, weakness. She denies any symptoms in the face or leg. She states this has never happened before but if her arm without tingling she would feel in her normal state of health. She denies any recent medication changes  Patient is a 52 y.o. female presenting with neurologic complaint. The history is provided by the patient.  Neurologic Problem This is a new problem. The current episode started 1 to 2 hours ago. The problem occurs constantly. The problem has not changed since onset.Pertinent negatives include no chest pain, no abdominal pain, no headaches and no shortness of breath. Nothing aggravates the symptoms. Nothing relieves the symptoms. She has tried nothing for the symptoms.    Past Medical History  Diagnosis Date  . Hypertension 05/2005  . Depression     with anxious features  . Obesity     Past Surgical History  Procedure Laterality Date  . Vesico-vaginal fistula repair  1994  . Ovarian cyst surgery  08/2002  . Endometrial ablation w/ novasure      Family History  Problem Relation Age of Onset  . Hypertension Mother   . Hypertension Father   . Heart disease Father 33    MI  . Alcohol abuse Father   . Heart disease Maternal Grandmother   . Heart disease Paternal Grandmother     History  Substance Use Topics  . Smoking status: Former Games developer  . Smokeless tobacco: Not on  file     Comment: quit many years ago.  . Alcohol Use: Yes     Comment: 3-4 times per week    OB History   Grav Para Term Preterm Abortions TAB SAB Ect Mult Living   2 2 2       2       Review of Systems  Constitutional: Negative for fever.  Eyes: Negative for visual disturbance.  Respiratory: Negative for shortness of breath.   Cardiovascular: Negative for chest pain.  Gastrointestinal: Negative for abdominal pain.  Neurological: Negative for weakness and headaches.  All other systems reviewed and are negative.    Allergies  Review of patient's allergies indicates no known allergies.  Home Medications   Current Outpatient Rx  Name  Route  Sig  Dispense  Refill  . FLUoxetine (PROZAC) 20 MG capsule   Oral   Take 1 capsule (20 mg total) by mouth daily.   90 capsule   3   . pantoprazole (PROTONIX) 40 MG tablet   Oral   Take 1 tablet (40 mg total) by mouth daily.   90 tablet   3     BP 137/76  Pulse 56  Temp(Src) 98.4 F (36.9 C) (Oral)  Resp 18  SpO2 100%  Physical Exam  Nursing note and vitals reviewed. Constitutional: She is oriented to person, place, and time. She appears well-developed and well-nourished. No distress.  HENT:  Head: Normocephalic and atraumatic.  Mouth/Throat: Oropharynx is clear and  moist.  Eyes: Conjunctivae and EOM are normal. Pupils are equal, round, and reactive to light.  Neck: Normal range of motion. Neck supple.  Cardiovascular: Normal rate, regular rhythm and intact distal pulses.   No murmur heard. Pulmonary/Chest: Effort normal and breath sounds normal. No respiratory distress. She has no wheezes. She has no rales.  Abdominal: Soft. She exhibits no distension. There is no tenderness. There is no rebound and no guarding.  Musculoskeletal: Normal range of motion. She exhibits no edema and no tenderness.  Neurological: She is alert and oriented to person, place, and time. She has normal strength. No cranial nerve deficit or  sensory deficit. Gait normal.  Normal strength and sensation in the left arm.  Normal 2+ radial pulse and cap refill.  No c-spine tenderness  Skin: Skin is warm and dry. No rash noted. No erythema.  Psychiatric: She has a normal mood and affect. Her behavior is normal.    ED Course  Procedures (including critical care time)  Labs Reviewed  POCT I-STAT, CHEM 8  POCT I-STAT TROPONIN I   Dg Chest 2 View  08/17/2012   *RADIOLOGY REPORT*  Clinical Data: Left arm numbness.  CHEST - 2 VIEW  Comparison: None.  Findings: Small hiatal hernia.  Heart is normal size.  Lungs are clear.  No effusions.  No acute bony abnormality.  IMPRESSION: No acute cardiopulmonary disease.   Original Report Authenticated By: Charlett Nose, M.D.   Ct Head Wo Contrast  08/17/2012   *RADIOLOGY REPORT*  Clinical Data:  Left arm numbness and tingling and left hand tremors.  CT HEAD WITHOUT CONTRAST CT CERVICAL SPINE WITHOUT CONTRAST  Technique:  Multidetector CT imaging of the head and cervical spine was performed following the standard protocol without intravenous contrast.  Multiplanar CT image reconstructions of the cervical spine were also generated.  Comparison:   None  CT HEAD  Findings: Normal appearing cerebral hemispheres and posterior fossa structures.  Normal size and position of the ventricles.  No intracranial hemorrhage, mass lesion or CT evidence of acute infarction.  IMPRESSION: Normal examination.  CT CERVICAL SPINE  Findings: Mild reversal of the normal cervical lordosis.  Minimal scoliosis.  C2-3:  Mild posterior disc bulging centrally.  C3-4:  Mild posterior disc bulging centrally.  C4-5:  Minimal posterior disc bulging and spur formation.  C5-6:  Moderate posterior spur formation and mild anterior spur formation without significant canal or foraminal stenosis.  C6-7:  Moderate posterior spur formation and mild anterior spur formation with minimal canal stenosis and no foraminal stenosis.  C7-T1:  Unremarkable.   IMPRESSION: Degenerative changes, as described above.  No evidence of neural compression.   Original Report Authenticated By: Beckie Salts, M.D.   Ct Cervical Spine Wo Contrast  08/17/2012   *RADIOLOGY REPORT*  Clinical Data:  Left arm numbness and tingling and left hand tremors.  CT HEAD WITHOUT CONTRAST CT CERVICAL SPINE WITHOUT CONTRAST  Technique:  Multidetector CT imaging of the head and cervical spine was performed following the standard protocol without intravenous contrast.  Multiplanar CT image reconstructions of the cervical spine were also generated.  Comparison:   None  CT HEAD  Findings: Normal appearing cerebral hemispheres and posterior fossa structures.  Normal size and position of the ventricles.  No intracranial hemorrhage, mass lesion or CT evidence of acute infarction.  IMPRESSION: Normal examination.  CT CERVICAL SPINE  Findings: Mild reversal of the normal cervical lordosis.  Minimal scoliosis.  C2-3:  Mild posterior disc bulging centrally.  C3-4:  Mild posterior disc bulging centrally.  C4-5:  Minimal posterior disc bulging and spur formation.  C5-6:  Moderate posterior spur formation and mild anterior spur formation without significant canal or foraminal stenosis.  C6-7:  Moderate posterior spur formation and mild anterior spur formation with minimal canal stenosis and no foraminal stenosis.  C7-T1:  Unremarkable.  IMPRESSION: Degenerative changes, as described above.  No evidence of neural compression.   Original Report Authenticated By: Beckie Salts, M.D.     Date: 08/17/2012  Rate: 52  Rhythm: normal sinus rhythm  QRS Axis: normal  Intervals: normal  ST/T Wave abnormalities: normal  Conduction Disutrbances: none  Narrative Interpretation: unremarkable      1. Cervical disc disease   2. Cervical radiculopathy       MDM   Patient presents due to left arm numbness starting approximately 2 hours ago while at work typing on the computer. She denies any associated  symptoms and is well-appearing here. No focal exam findings. She has normal sensation bilaterally and has normal strength. She denies any recent medication changes and is well appearing on exam. Feel most likely this is neuropathic pain coming from cervical radiculopathy however strong family history of cardiac disease and stroke.  Patient has no acute risk factors.  EKG within normal limits. Do not feel further cardiac evaluation is warranted other than a chest x-ray. CT of the head and C-spine pending an i-STAT to insure normal electrolytes  4:11 PM Patient had normal troponin, i-STAT and head CT. T-spine films show multiple levels of degenerative disc without neural compression. Feel that this is most likely the cause of her symptoms. Patient will follow up with her PCP or neurosurgery and given a steroid dose pack to use as needed if symptoms worsen       Gwyneth Sprout, MD 08/17/12 1612  Gwyneth Sprout, MD 08/17/12 [phone]

## 2012-08-17 NOTE — Telephone Encounter (Signed)
Agree with advisement

## 2012-08-17 NOTE — ED Notes (Signed)
1155 was at work sitting at desk started to have numbness and tingling in left arm hand started to shake. Has lessened but is still there at this time having numbness left surface of arm no h/a dizziness or nausea  E, had equal grip no facial droop, dr took her off meds she states for bp about a year ago.

## 2012-08-17 NOTE — Telephone Encounter (Signed)
Patient Information:  Caller Name: Navina  Phone: [phone]  Patient: Meagan Conway, Meagan Conway  Gender: Female  DOB: [birth year]  Age: 52 Years  PCP: [facility], [employer] [facility])  Pregnant: No  Office Follow Up:  Does the office need to follow up with this patient?: No  Instructions For The Office: N/A   Symptoms  Reason For Call & Symptoms: Patient reports left hand and arm  numb, tingly and shaking; onset 11:50/14.  Denies other symptoms.  Advised 911 per Neurologic  Deficit guideline due to "New neurologic deficit that is present NOW, sudden onset of ANY of the following"  Weakness of the face, arm or leg on one side of the body, Numbness of the face, arm or leg on one side of the body.  Caller agreed.  Reviewed Health History In EMR: Yes  Reviewed Medications In EMR: Yes  Reviewed Allergies In EMR: Yes  Reviewed Surgeries / Procedures: Yes  Date of Onset of Symptoms: 08/17/2012 OB / GYN:  LMP: Unknown  Guideline(s) Used:  Neurologic Deficit  Disposition Per Guideline:   Call EMS 911 Now  Reason For Disposition Reached:   New neurologic deficit that is present NOW, sudden onset of ANY of the following:   Weakness of the face, arm, or leg on one side of the body  Numbness of the face, arm, or leg on one side of the body  Loss of speech or garbled speech  Advice Given:  Call Back If:  You become worse.  Patient Will Follow Care Advice:  YES

## 2012-08-23 ENCOUNTER — Other Ambulatory Visit: Payer: Self-pay | Admitting: Neurological Surgery

## 2012-08-23 DIAGNOSIS — R200 Anesthesia of skin: Secondary | ICD-10-CM

## 2012-08-23 DIAGNOSIS — R202 Paresthesia of skin: Secondary | ICD-10-CM

## 2012-09-04 ENCOUNTER — Ambulatory Visit
Admission: RE | Admit: 2012-09-04 | Discharge: 2012-09-04 | Disposition: A | Discharge status: Home / Self Care | Payer: BC Managed Care – PPO | Source: Ambulatory Visit | Attending: Neurological Surgery | Admitting: Neurological Surgery

## 2012-09-04 DIAGNOSIS — R202 Paresthesia of skin: Secondary | ICD-10-CM

## 2012-09-04 DIAGNOSIS — R200 Anesthesia of skin: Secondary | ICD-10-CM

## 2012-09-04 MED ORDER — GADOBENATE DIMEGLUMINE 529 MG/ML IV SOLN
20.0000 mL | Freq: Once | INTRAVENOUS | Status: AC | PRN
  Administered 2012-09-04: 20 mL via INTRAVENOUS

## 2013-01-05 ENCOUNTER — Ambulatory Visit: Payer: Self-pay | Admitting: Family Medicine

## 2013-04-30 ENCOUNTER — Ambulatory Visit: Payer: BC Managed Care – PPO | Admitting: Obstetrics & Gynecology

## 2013-05-08 ENCOUNTER — Ambulatory Visit: Payer: BC Managed Care – PPO | Admitting: Obstetrics & Gynecology

## 2013-05-14 ENCOUNTER — Ambulatory Visit (INDEPENDENT_AMBULATORY_CARE_PROVIDER_SITE_OTHER): Payer: BC Managed Care – PPO | Admitting: Obstetrics and Gynecology

## 2013-05-14 ENCOUNTER — Encounter: Payer: Self-pay | Admitting: Obstetrics and Gynecology

## 2013-05-14 VITALS — BP 140/98 | HR 66 | Ht 65.0 in | Wt 254.0 lb

## 2013-05-14 DIAGNOSIS — Z124 Encounter for screening for malignant neoplasm of cervix: Secondary | ICD-10-CM

## 2013-05-14 DIAGNOSIS — Z01419 Encounter for gynecological examination (general) (routine) without abnormal findings: Secondary | ICD-10-CM

## 2013-05-14 NOTE — Progress Notes (Signed)
  Subjective:     Meagan Conway is a 53 y.o. female who is here for a comprehensive physical exam. The patient reports no problems.  History   Social History  . Marital Status: Married    Spouse Name: N/A    Number of Children: N/A  . Years of Education: N/A   Occupational History  . Not on file.   Social History Main Topics  . Smoking status: Former Research scientist (life sciences)  . Smokeless tobacco: Not on file     Comment: quit many years ago.  . Alcohol Use: Yes     Comment: 3-4 times per week  . Drug Use: No  . Sexual Activity: Yes    Partners: Male    Birth Control/ Protection: None     Comment: husband has a vasectomy   Other Topics Concern  . Not on file   Social History Narrative  . No narrative on file   Health Maintenance  Topic Date Due  . Influenza Vaccine  10/13/2012  . Mammogram  03/20/2013  . Pap Smear  03/07/2015  . Colonoscopy  02/17/2016  . Tetanus/tdap  11/14/2016   Past Medical History  Diagnosis Date  . Hypertension 05/2005  . Depression     with anxious features  . Obesity    Past Surgical History  Procedure Laterality Date  . Vesico-vaginal fistula repair  1994  . Ovarian cyst surgery  08/2002  . Endometrial ablation w/ novasure     Family History  Problem Relation Age of Onset  . Hypertension Mother   . Hypertension Father   . Heart disease Father 88    MI  . Alcohol abuse Father   . Heart disease Maternal Grandmother   . Heart disease Paternal Grandmother    History  Substance Use Topics  . Smoking status: Former Research scientist (life sciences)  . Smokeless tobacco: Not on file     Comment: quit many years ago.  . Alcohol Use: Yes     Comment: 3-4 times per week       Review of Systems A comprehensive review of systems was negative.   Objective:      GENERAL: Well-developed, well-nourished female in no acute distress. Obese HEENT: Normocephalic, atraumatic. Sclerae anicteric.  NECK: Supple. Normal thyroid.  LUNGS: Clear to auscultation bilaterally.   HEART: Regular rate and rhythm. BREASTS: Symmetric in size. No palpable masses or lymphadenopathy, skin changes, or nipple drainage. ABDOMEN: Soft, nontender, nondistended. No organomegaly. PELVIC: Normal external female genitalia. Vagina is pink and rugated.  Normal discharge. Normal appearing cervix. Uterus is normal in size. No adnexal mass or tenderness. EXTREMITIES: No cyanosis, clubbing, or edema, 2+ distal pulses.    Assessment:    Healthy female exam.      Plan:    pap smear collected Referral for mammography provided Patient advised to perform monthly self breast and vulva exam Encouraged her to return to her exercise regimen See After Visit Summary for Counseling Recommendations

## 2013-05-14 NOTE — Patient Instructions (Signed)
Preventive Care for Adults, Female A healthy lifestyle and preventive care can promote health and wellness. Preventive health guidelines for women include the following key practices.  A routine yearly physical is a good way to check with your health care provider about your health and preventive screening. It is a chance to share any concerns and updates on your health and to receive a thorough exam.  Visit your dentist for a routine exam and preventive care every 6 months. Brush your teeth twice a day and floss once a day. Good oral hygiene prevents tooth decay and gum disease.  The frequency of eye exams is based on your age, health, family medical history, use of contact lenses, and other factors. Follow your health care provider's recommendations for frequency of eye exams.  Eat a healthy diet. Foods like vegetables, fruits, whole grains, low-fat dairy products, and lean protein foods contain the nutrients you need without too many calories. Decrease your intake of foods high in solid fats, added sugars, and salt. Eat the right amount of calories for you.Get information about a proper diet from your health care provider, if necessary.  Regular physical exercise is one of the most important things you can do for your health. Most adults should get at least 150 minutes of moderate-intensity exercise (any activity that increases your heart rate and causes you to sweat) each week. In addition, most adults need muscle-strengthening exercises on 2 or more days a week.  Maintain a healthy weight. The body mass index (BMI) is a screening tool to identify possible weight problems. It provides an estimate of body fat based on height and weight. Your health care provider can find your BMI, and can help you achieve or maintain a healthy weight.For adults 20 years and older:  A BMI below 18.5 is considered underweight.  A BMI of 18.5 to 24.9 is normal.  A BMI of 25 to 29.9 is considered  overweight.  A BMI of 30 and above is considered obese.  Maintain normal blood lipids and cholesterol levels by exercising and minimizing your intake of saturated fat. Eat a balanced diet with plenty of fruit and vegetables. Blood tests for lipids and cholesterol should begin at age 20 and be repeated every 5 years. If your lipid or cholesterol levels are high, you are over 50, or you are at high risk for heart disease, you may need your cholesterol levels checked more frequently.Ongoing high lipid and cholesterol levels should be treated with medicines if diet and exercise are not working.  If you smoke, find out from your health care provider how to quit. If you do not use tobacco, do not start.  Lung cancer screening is recommended for adults aged 55 80 years who are at high risk for developing lung cancer because of a history of smoking. A yearly low-dose CT scan of the lungs is recommended for people who have at least a 30-pack-year history of smoking and are a current smoker or have quit within the past 15 years. A pack year of smoking is smoking an average of 1 pack of cigarettes a day for 1 year (for example: 1 pack a day for 30 years or 2 packs a day for 15 years). Yearly screening should continue until the smoker has stopped smoking for at least 15 years. Yearly screening should be stopped for people who develop a health problem that would prevent them from having lung cancer treatment.  If you are pregnant, do not drink alcohol. If you   are breastfeeding, be very cautious about drinking alcohol. If you are not pregnant and choose to drink alcohol, do not have more than 1 drink per day. One drink is considered to be 12 ounces (355 mL) of beer, 5 ounces (148 mL) of wine, or 1.5 ounces (44 mL) of liquor.  Avoid use of street drugs. Do not share needles with anyone. Ask for help if you need support or instructions about stopping the use of drugs.  High blood pressure causes heart disease and  increases the risk of stroke. Your blood pressure should be checked at least every 1 to 2 years. Ongoing high blood pressure should be treated with medicines if weight loss and exercise do not work.  If you are 20 51 years old, ask your health care provider if you should take aspirin to prevent strokes.  Diabetes screening involves taking a blood sample to check your fasting blood sugar level. This should be done once every 3 years, after age 35, if you are within normal weight and without risk factors for diabetes. Testing should be considered at a younger age or be carried out more frequently if you are overweight and have at least 1 risk factor for diabetes.  Breast cancer screening is essential preventive care for women. You should practice "breast self-awareness." This means understanding the normal appearance and feel of your breasts and may include breast self-examination. Any changes detected, no matter how small, should be reported to a health care provider. Women in their 42s and 30s should have a clinical breast exam (CBE) by a health care provider as part of a regular health exam every 1 to 3 years. After age 74, women should have a CBE every year. Starting at age 43, women should consider having a mammogram (breast X-ray test) every year. Women who have a family history of breast cancer should talk to their health care provider about genetic screening. Women at a high risk of breast cancer should talk to their health care providers about having an MRI and a mammogram every year.  Breast cancer gene (BRCA)-related cancer risk assessment is recommended for women who have family members with BRCA-related cancers. BRCA-related cancers include breast, ovarian, tubal, and peritoneal cancers. Having family members with these cancers may be associated with an increased risk for harmful changes (mutations) in the breast cancer genes BRCA1 and BRCA2. Results of the assessment will determine the need for  genetic counseling and BRCA1 and BRCA2 testing.  The Pap test is a screening test for cervical cancer. A Pap test can show cell changes on the cervix that might become cervical cancer if left untreated. A Pap test is a procedure in which cells are obtained and examined from the lower end of the uterus (cervix).  Women should have a Pap test starting at age 60.  Between ages 63 and 62, Pap tests should be repeated every 2 years.  Beginning at age 43, you should have a Pap test every 3 years as long as the past 3 Pap tests have been normal.  Some women have medical problems that increase the chance of getting cervical cancer. Talk to your health care provider about these problems. It is especially important to talk to your health care provider if a new problem develops soon after your last Pap test. In these cases, your health care provider may recommend more frequent screening and Pap tests.  The above recommendations are the same for women who have or have not gotten the vaccine  for human papillomavirus (HPV).  If you had a hysterectomy for a problem that was not cancer or a condition that could lead to cancer, then you no longer need Pap tests. Even if you no longer need a Pap test, a regular exam is a good idea to make sure no other problems are starting.  If you are between ages 65 and 70 years, and you have had normal Pap tests going back 10 years, you no longer need Pap tests. Even if you no longer need a Pap test, a regular exam is a good idea to make sure no other problems are starting.  If you have had past treatment for cervical cancer or a condition that could lead to cancer, you need Pap tests and screening for cancer for at least 20 years after your treatment.  If Pap tests have been discontinued, risk factors (such as a new sexual partner) need to be reassessed to determine if screening should be resumed.  The HPV test is an additional test that may be used for cervical cancer  screening. The HPV test looks for the virus that can cause the cell changes on the cervix. The cells collected during the Pap test can be tested for HPV. The HPV test could be used to screen women aged 30 years and older, and should be used in women of any age who have unclear Pap test results. After the age of 30, women should have HPV testing at the same frequency as a Pap test.  Colorectal cancer can be detected and often prevented. Most routine colorectal cancer screening begins at the age of 50 years and continues through age 75 years. However, your health care provider may recommend screening at an earlier age if you have risk factors for colon cancer. On a yearly basis, your health care provider may provide home test kits to check for hidden blood in the stool. Use of a small camera at the end of a tube, to directly examine the colon (sigmoidoscopy or colonoscopy), can detect the earliest forms of colorectal cancer. Talk to your health care provider about this at age 50, when routine screening begins. Direct exam of the colon should be repeated every 5 10 years through age 75 years, unless early forms of pre-cancerous polyps or small growths are found.  People who are at an increased risk for hepatitis B should be screened for this virus. You are considered at high risk for hepatitis B if:  You were born in a country where hepatitis B occurs often. Talk with your health care provider about which countries are considered high risk.  Your parents were born in a high-risk country and you have not received a shot to protect against hepatitis B (hepatitis B vaccine).  You have HIV or AIDS.  You use needles to inject street drugs.  You live with, or have sex with, someone who has Hepatitis B.  You get hemodialysis treatment.  You take certain medicines for conditions like cancer, organ transplantation, and autoimmune conditions.  Hepatitis C blood testing is recommended for all people born from  1945 through 1965 and any individual with known risks for hepatitis C.  Practice safe sex. Use condoms and avoid high-risk sexual practices to reduce the spread of sexually transmitted infections (STIs). STIs include gonorrhea, chlamydia, syphilis, trichomonas, herpes, HPV, and human immunodeficiency virus (HIV). Herpes, HIV, and HPV are viral illnesses that have no cure. They can result in disability, cancer, and death. Sexually active women aged 25   years and younger should be checked for chlamydia. Older women with new or multiple partners should also be tested for chlamydia. Testing for other STIs is recommended if you are sexually active and at increased risk.  Osteoporosis is a disease in which the bones lose minerals and strength with aging. This can result in serious bone fractures or breaks. The risk of osteoporosis can be identified using a bone density scan. Women ages 65 years and over and women at risk for fractures or osteoporosis should discuss screening with their health care providers. Ask your health care provider whether you should take a calcium supplement or vitamin D to reduce the rate of osteoporosis.  Menopause can be associated with physical symptoms and risks. Hormone replacement therapy is available to decrease symptoms and risks. You should talk to your health care provider about whether hormone replacement therapy is right for you.  Use sunscreen. Apply sunscreen liberally and repeatedly throughout the day. You should seek shade when your shadow is shorter than you. Protect yourself by wearing long sleeves, pants, a wide-brimmed hat, and sunglasses year round, whenever you are outdoors.  Once a month, do a whole body skin exam, using a mirror to look at the skin on your back. Tell your health care provider of new moles, moles that have irregular borders, moles that are larger than a pencil eraser, or moles that have changed in shape or color.  Stay current with required  vaccines (immunizations).  Influenza vaccine. All adults should be immunized every year.  Tetanus, diphtheria, and acellular pertussis (Td, Tdap) vaccine. Pregnant women should receive 1 dose of Tdap vaccine during each pregnancy. The dose should be obtained regardless of the length of time since the last dose. Immunization is preferred during the 27th 36th week of gestation. An adult who has not previously received Tdap or who does not know her vaccine status should receive 1 dose of Tdap. This initial dose should be followed by tetanus and diphtheria toxoids (Td) booster doses every 10 years. Adults with an unknown or incomplete history of completing a 3-dose immunization series with Td-containing vaccines should begin or complete a primary immunization series including a Tdap dose. Adults should receive a Td booster every 10 years.  Varicella vaccine. An adult without evidence of immunity to varicella should receive 2 doses or a second dose if she has previously received 1 dose. Pregnant females who do not have evidence of immunity should receive the first dose after pregnancy. This first dose should be obtained before leaving the health care facility. The second dose should be obtained 4 8 weeks after the first dose.  Human papillomavirus (HPV) vaccine. Females aged 13 26 years who have not received the vaccine previously should obtain the 3-dose series. The vaccine is not recommended for use in pregnant females. However, pregnancy testing is not needed before receiving a dose. If a female is found to be pregnant after receiving a dose, no treatment is needed. In that case, the remaining doses should be delayed until after the pregnancy. Immunization is recommended for any person with an immunocompromised condition through the age of 26 years if she did not get any or all doses earlier. During the 3-dose series, the second dose should be obtained 4 8 weeks after the first dose. The third dose should be  obtained 24 weeks after the first dose and 16 weeks after the second dose.  Zoster vaccine. One dose is recommended for adults aged 60 years or older unless certain   conditions are present.  Measles, mumps, and rubella (MMR) vaccine. Adults born before 1957 generally are considered immune to measles and mumps. Adults born in 1957 or later should have 1 or more doses of MMR vaccine unless there is a contraindication to the vaccine or there is laboratory evidence of immunity to each of the three diseases. A routine second dose of MMR vaccine should be obtained at least 28 days after the first dose for students attending postsecondary schools, health care workers, or international travelers. People who received inactivated measles vaccine or an unknown type of measles vaccine during 1963 1967 should receive 2 doses of MMR vaccine. People who received inactivated mumps vaccine or an unknown type of mumps vaccine before 1979 and are at high risk for mumps infection should consider immunization with 2 doses of MMR vaccine. For females of childbearing age, rubella immunity should be determined. If there is no evidence of immunity, females who are not pregnant should be vaccinated. If there is no evidence of immunity, females who are pregnant should delay immunization until after pregnancy. Unvaccinated health care workers born before 1957 who lack laboratory evidence of measles, mumps, or rubella immunity or laboratory confirmation of disease should consider measles and mumps immunization with 2 doses of MMR vaccine or rubella immunization with 1 dose of MMR vaccine.  Pneumococcal 13-valent conjugate (PCV13) vaccine. When indicated, a person who is uncertain of her immunization history and has no record of immunization should receive the PCV13 vaccine. An adult aged 19 years or older who has certain medical conditions and has not been previously immunized should receive 1 dose of PCV13 vaccine. This PCV13 should be  followed with a dose of pneumococcal polysaccharide (PPSV23) vaccine. The PPSV23 vaccine dose should be obtained at least 8 weeks after the dose of PCV13 vaccine. An adult aged 19 years or older who has certain medical conditions and previously received 1 or more doses of PPSV23 vaccine should receive 1 dose of PCV13. The PCV13 vaccine dose should be obtained 1 or more years after the last PPSV23 vaccine dose.  Pneumococcal polysaccharide (PPSV23) vaccine. When PCV13 is also indicated, PCV13 should be obtained first. All adults aged 65 years and older should be immunized. An adult younger than age 65 years who has certain medical conditions should be immunized. Any person who resides in a nursing home or long-term care facility should be immunized. An adult smoker should be immunized. People with an immunocompromised condition and certain other conditions should receive both PCV13 and PPSV23 vaccines. People with human immunodeficiency virus (HIV) infection should be immunized as soon as possible after diagnosis. Immunization during chemotherapy or radiation therapy should be avoided. Routine use of PPSV23 vaccine is not recommended for American Indians, [place] Natives, or people younger than 65 years unless there are medical conditions that require PPSV23 vaccine. When indicated, people who have unknown immunization and have no record of immunization should receive PPSV23 vaccine. One-time revaccination 5 years after the first dose of PPSV23 is recommended for people aged 19 64 years who have chronic kidney failure, nephrotic syndrome, asplenia, or immunocompromised conditions. People who received 1 2 doses of PPSV23 before age 65 years should receive another dose of PPSV23 vaccine at age 65 years or later if at least 5 years have passed since the previous dose. Doses of PPSV23 are not needed for people immunized with PPSV23 at or after age 65 years.  Meningococcal vaccine. Adults with asplenia or persistent  complement component deficiencies should receive 2   doses of quadrivalent meningococcal conjugate (MenACWY-D) vaccine. The doses should be obtained at least 2 months apart. Microbiologists working with certain meningococcal bacteria, military recruits, people at risk during an outbreak, and people who travel to or live in countries with a high rate of meningitis should be immunized. A first-year college student up through age 21 years who is living in a residence [place] should receive a dose if she did not receive a dose on or after her 16th birthday. Adults who have certain high-risk conditions should receive one or more doses of vaccine.  Hepatitis A vaccine. Adults who wish to be protected from this disease, have certain high-risk conditions, work with hepatitis A-infected animals, work in hepatitis A research labs, or travel to or work in countries with a high rate of hepatitis A should be immunized. Adults who were previously unvaccinated and who anticipate close contact with an international adoptee during the first 60 days after arrival in the [place] from a country with a high rate of hepatitis A should be immunized.  Hepatitis B vaccine. Adults who wish to be protected from this disease, have certain high-risk conditions, may be exposed to blood or other infectious body fluids, are household contacts or sex partners of hepatitis B positive people, are clients or workers in certain care facilities, or travel to or work in countries with a high rate of hepatitis B should be immunized.  Haemophilus influenzae type b (Hib) vaccine. A previously unvaccinated person with asplenia or sickle cell disease or having a scheduled splenectomy should receive 1 dose of Hib vaccine. Regardless of previous immunization, a recipient of a hematopoietic stem cell transplant should receive a 3-dose series 6 12 months after her successful transplant. Hib vaccine is not recommended for adults with HIV infection. Preventive  Services / Frequency Ages 19 to 39years  Blood pressure check.** / Every 1 to 2 years.  Lipid and cholesterol check.** / Every 5 years beginning at age 20.  Clinical breast exam.** / Every 3 years for women in their 20s and 30s.  BRCA-related cancer risk assessment.** / For women who have family members with a BRCA-related cancer (breast, ovarian, tubal, or peritoneal cancers).  Pap test.** / Every 2 years from ages 21 through 29. Every 3 years starting at age 30 through age 65 or 70 with a history of 3 consecutive normal Pap tests.  HPV screening.** / Every 3 years from ages 30 through ages 65 to 70 with a history of 3 consecutive normal Pap tests.  Hepatitis C blood test.** / For any individual with known risks for hepatitis C.  Skin self-exam. / Monthly.  Influenza vaccine. / Every year.  Tetanus, diphtheria, and acellular pertussis (Tdap, Td) vaccine.** / Consult your health care provider. Pregnant women should receive 1 dose of Tdap vaccine during each pregnancy. 1 dose of Td every 10 years.  Varicella vaccine.** / Consult your health care provider. Pregnant females who do not have evidence of immunity should receive the first dose after pregnancy.  HPV vaccine. / 3 doses over 6 months, if 26 and younger. The vaccine is not recommended for use in pregnant females. However, pregnancy testing is not needed before receiving a dose.  Measles, mumps, rubella (MMR) vaccine.** / You need at least 1 dose of MMR if you were born in 1957 or later. You may also need a 2nd dose. For females of childbearing age, rubella immunity should be determined. If there is no evidence of immunity, females who are not   pregnant should be vaccinated. If there is no evidence of immunity, females who are pregnant should delay immunization until after pregnancy.  Pneumococcal 13-valent conjugate (PCV13) vaccine.** / Consult your health care provider.  Pneumococcal polysaccharide (PPSV23) vaccine.** / 1 to 2  doses if you smoke cigarettes or if you have certain conditions.  Meningococcal vaccine.** / 1 dose if you are age 19 to 21 years and a first-year college student living in a residence [place], or have one of several medical conditions, you need to get vaccinated against meningococcal disease. You may also need additional booster doses.  Hepatitis A vaccine.** / Consult your health care provider.  Hepatitis B vaccine.** / Consult your health care provider.  Haemophilus influenzae type b (Hib) vaccine.** / Consult your health care provider. Ages 40 to 64years  Blood pressure check.** / Every 1 to 2 years.  Lipid and cholesterol check.** / Every 5 years beginning at age 20 years.  Lung cancer screening. / Every year if you are aged 55 80 years and have a 30-pack-year history of smoking and currently smoke or have quit within the past 15 years. Yearly screening is stopped once you have quit smoking for at least 15 years or develop a health problem that would prevent you from having lung cancer treatment.  Clinical breast exam.** / Every year after age 40 years.  BRCA-related cancer risk assessment.** / For women who have family members with a BRCA-related cancer (breast, ovarian, tubal, or peritoneal cancers).  Mammogram.** / Every year beginning at age 40 years and continuing for as long as you are in good health. Consult with your health care provider.  Pap test.** / Every 3 years starting at age 30 years through age 65 or 70 years with a history of 3 consecutive normal Pap tests.  HPV screening.** / Every 3 years from ages 30 years through ages 65 to 70 years with a history of 3 consecutive normal Pap tests.  Fecal occult blood test (FOBT) of stool. / Every year beginning at age 50 years and continuing until age 75 years. You may not need to do this test if you get a colonoscopy every 10 years.  Flexible sigmoidoscopy or colonoscopy.** / Every 5 years for a flexible sigmoidoscopy or every  10 years for a colonoscopy beginning at age 50 years and continuing until age 75 years.  Hepatitis C blood test.** / For all people born from 1945 through 1965 and any individual with known risks for hepatitis C.  Skin self-exam. / Monthly.  Influenza vaccine. / Every year.  Tetanus, diphtheria, and acellular pertussis (Tdap/Td) vaccine.** / Consult your health care provider. Pregnant women should receive 1 dose of Tdap vaccine during each pregnancy. 1 dose of Td every 10 years.  Varicella vaccine.** / Consult your health care provider. Pregnant females who do not have evidence of immunity should receive the first dose after pregnancy.  Zoster vaccine.** / 1 dose for adults aged 60 years or older.  Measles, mumps, rubella (MMR) vaccine.** / You need at least 1 dose of MMR if you were born in 1957 or later. You may also need a 2nd dose. For females of childbearing age, rubella immunity should be determined. If there is no evidence of immunity, females who are not pregnant should be vaccinated. If there is no evidence of immunity, females who are pregnant should delay immunization until after pregnancy.  Pneumococcal 13-valent conjugate (PCV13) vaccine.** / Consult your health care provider.  Pneumococcal polysaccharide (PPSV23) vaccine.** / 1   to 2 doses if you smoke cigarettes or if you have certain conditions.  Meningococcal vaccine.** / Consult your health care provider.  Hepatitis A vaccine.** / Consult your health care provider.  Hepatitis B vaccine.** / Consult your health care provider.  Haemophilus influenzae type b (Hib) vaccine.** / Consult your health care provider. Ages 65 years and over  Blood pressure check.** / Every 1 to 2 years.  Lipid and cholesterol check.** / Every 5 years beginning at age 20 years.  Lung cancer screening. / Every year if you are aged 55 80 years and have a 30-pack-year history of smoking and currently smoke or have quit within the past 15 years.  Yearly screening is stopped once you have quit smoking for at least 15 years or develop a health problem that would prevent you from having lung cancer treatment.  Clinical breast exam.** / Every year after age 40 years.  BRCA-related cancer risk assessment.** / For women who have family members with a BRCA-related cancer (breast, ovarian, tubal, or peritoneal cancers).  Mammogram.** / Every year beginning at age 40 years and continuing for as long as you are in good health. Consult with your health care provider.  Pap test.** / Every 3 years starting at age 30 years through age 65 or 70 years with 3 consecutive normal Pap tests. Testing can be stopped between 65 and 70 years with 3 consecutive normal Pap tests and no abnormal Pap or HPV tests in the past 10 years.  HPV screening.** / Every 3 years from ages 30 years through ages 65 or 70 years with a history of 3 consecutive normal Pap tests. Testing can be stopped between 65 and 70 years with 3 consecutive normal Pap tests and no abnormal Pap or HPV tests in the past 10 years.  Fecal occult blood test (FOBT) of stool. / Every year beginning at age 50 years and continuing until age 75 years. You may not need to do this test if you get a colonoscopy every 10 years.  Flexible sigmoidoscopy or colonoscopy.** / Every 5 years for a flexible sigmoidoscopy or every 10 years for a colonoscopy beginning at age 50 years and continuing until age 75 years.  Hepatitis C blood test.** / For all people born from 1945 through 1965 and any individual with known risks for hepatitis C.  Osteoporosis screening.** / A one-time screening for women ages 65 years and over and women at risk for fractures or osteoporosis.  Skin self-exam. / Monthly.  Influenza vaccine. / Every year.  Tetanus, diphtheria, and acellular pertussis (Tdap/Td) vaccine.** / 1 dose of Td every 10 years.  Varicella vaccine.** / Consult your health care provider.  Zoster vaccine.** / 1  dose for adults aged 60 years or older.  Pneumococcal 13-valent conjugate (PCV13) vaccine.** / Consult your health care provider.  Pneumococcal polysaccharide (PPSV23) vaccine.** / 1 dose for all adults aged 65 years and older.  Meningococcal vaccine.** / Consult your health care provider.  Hepatitis A vaccine.** / Consult your health care provider.  Hepatitis B vaccine.** / Consult your health care provider.  Haemophilus influenzae type b (Hib) vaccine.** / Consult your health care provider. ** Family history and personal history of risk and conditions may change your health care provider's recommendations. Document Released: 04/27/2001 Document Revised: 12/20/2012 Document Reviewed: 07/27/2010 [employer] Patient Information 2014 [employer].  

## 2013-06-01 ENCOUNTER — Other Ambulatory Visit: Payer: Self-pay | Admitting: Family Medicine

## 2013-06-05 ENCOUNTER — Encounter: Payer: Self-pay | Admitting: Family Medicine

## 2013-06-07 ENCOUNTER — Encounter: Payer: Self-pay | Admitting: Family Medicine

## 2013-06-27 ENCOUNTER — Telehealth: Payer: Self-pay | Admitting: Family Medicine

## 2013-06-27 ENCOUNTER — Other Ambulatory Visit (INDEPENDENT_AMBULATORY_CARE_PROVIDER_SITE_OTHER): Payer: BC Managed Care – PPO

## 2013-06-27 DIAGNOSIS — Z0000 Encounter for general adult medical examination without abnormal findings: Secondary | ICD-10-CM

## 2013-06-27 LAB — CBC WITH DIFFERENTIAL/PLATELET
BASOS ABS: 0.1 10*3/uL (ref 0.0–0.1)
Basophils Relative: 0.6 % (ref 0.0–3.0)
Eosinophils Absolute: 0.2 10*3/uL (ref 0.0–0.7)
Eosinophils Relative: 1.8 % (ref 0.0–5.0)
HCT: 38.1 % (ref 36.0–46.0)
HEMOGLOBIN: 12.7 g/dL (ref 12.0–15.0)
LYMPHS PCT: 25.1 % (ref 12.0–46.0)
Lymphs Abs: 2.3 10*3/uL (ref 0.7–4.0)
MCHC: 33.5 g/dL (ref 30.0–36.0)
MCV: 83.7 fl (ref 78.0–100.0)
MONOS PCT: 4.3 % (ref 3.0–12.0)
Monocytes Absolute: 0.4 10*3/uL (ref 0.1–1.0)
NEUTROS ABS: 6.2 10*3/uL (ref 1.4–7.7)
NEUTROS PCT: 68.2 % (ref 43.0–77.0)
PLATELETS: 324 10*3/uL (ref 150.0–400.0)
RBC: 4.55 Mil/uL (ref 3.87–5.11)
RDW: 14.7 % — AB (ref 11.5–14.6)
WBC: 9.2 10*3/uL (ref 4.5–10.5)

## 2013-06-27 LAB — COMPREHENSIVE METABOLIC PANEL
ALBUMIN: 4.1 g/dL (ref 3.5–5.2)
ALT: 34 U/L (ref 0–35)
AST: 22 U/L (ref 0–37)
Alkaline Phosphatase: 80 U/L (ref 39–117)
BUN: 18 mg/dL (ref 6–23)
CALCIUM: 9.3 mg/dL (ref 8.4–10.5)
CHLORIDE: 105 meq/L (ref 96–112)
CO2: 28 meq/L (ref 19–32)
Creatinine, Ser: 0.9 mg/dL (ref 0.4–1.2)
GFR: 68.57 mL/min (ref >60.00)
Glucose, Bld: 111 mg/dL — ABNORMAL HIGH (ref 70–99)
POTASSIUM: 4 meq/L (ref 3.5–5.1)
SODIUM: 140 meq/L (ref 135–145)
TOTAL PROTEIN: 7.2 g/dL (ref 6.0–8.3)
Total Bilirubin: 0.3 mg/dL (ref 0.3–1.2)

## 2013-06-27 LAB — LIPID PANEL
CHOL/HDL RATIO: 3
Cholesterol: 150 mg/dL (ref 0–200)
HDL: 52.6 mg/dL (ref >39.00)
LDL Cholesterol: 77 mg/dL (ref 0–99)
Triglycerides: 101 mg/dL (ref 0.0–149.0)
VLDL: 20.2 mg/dL (ref 0.0–40.0)

## 2013-06-27 LAB — TSH: TSH: 1.21 u[IU]/mL (ref 0.35–5.50)

## 2013-06-27 NOTE — Telephone Encounter (Signed)
Message copied by Abner Greenspan on Wed Jun 27, 2013  6:12 AM ------      Message from: Ellamae Sia      Created: Thu Jun 14, 2013 12:14 PM      Regarding: Lab orders for Wednesday, 4.15.15       Patient is scheduled for CPX labs, please order future labs, Thanks , Terri       ------

## 2013-07-04 ENCOUNTER — Encounter: Payer: Self-pay | Admitting: Family Medicine

## 2013-07-04 ENCOUNTER — Ambulatory Visit (INDEPENDENT_AMBULATORY_CARE_PROVIDER_SITE_OTHER): Payer: BC Managed Care – PPO | Admitting: Family Medicine

## 2013-07-04 VITALS — BP 118/82 | HR 58 | Temp 98.3°F | Ht 64.5 in | Wt 245.2 lb

## 2013-07-04 DIAGNOSIS — R7309 Other abnormal glucose: Secondary | ICD-10-CM | POA: Insufficient documentation

## 2013-07-04 DIAGNOSIS — R7303 Prediabetes: Secondary | ICD-10-CM | POA: Insufficient documentation

## 2013-07-04 DIAGNOSIS — E669 Obesity, unspecified: Secondary | ICD-10-CM

## 2013-07-04 DIAGNOSIS — R739 Hyperglycemia, unspecified: Secondary | ICD-10-CM

## 2013-07-04 DIAGNOSIS — Z0000 Encounter for general adult medical examination without abnormal findings: Secondary | ICD-10-CM

## 2013-07-04 MED ORDER — FLUOXETINE HCL 20 MG PO CAPS: ORAL_CAPSULE | ORAL | Status: DC

## 2013-07-04 MED ORDER — PANTOPRAZOLE SODIUM 40 MG PO TBEC: DELAYED_RELEASE_TABLET | ORAL | Status: DC
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
## 2013-07-04 NOTE — Patient Instructions (Signed)
Take care of yourself  Keep working on weight loss Labs look good but glucose (sugar) level is a bit elevated  Work on low sugar diet and weight loss for that  Cholesterol is great

## 2013-07-04 NOTE — Progress Notes (Signed)
Pre visit review using our clinic review tool, if applicable. No additional management support is needed unless otherwise documented below in the visit note. 

## 2013-07-05 NOTE — Assessment & Plan Note (Signed)
Fasting glucose 111 No symptoms  Pt working on wt loss and low glycemic diet  Enc this  Will continue to follow

## 2013-07-05 NOTE — Assessment & Plan Note (Signed)
Discussed how this problem influences overall health and the risks it imposes  Reviewed plan for weight loss with lower calorie diet (via better food choices and also portion control or program like weight watchers) and exercise building up to or more than 30 minutes 5 days per week including some aerobic activity    

## 2013-07-05 NOTE — Assessment & Plan Note (Signed)
Reviewed health habits including diet and exercise and skin cancer prevention Reviewed appropriate screening tests for age  Also reviewed health mt list, fam hx and immunization status , as well as social and family history   See HPI Labs reviewed  

## 2014-01-14 ENCOUNTER — Encounter: Payer: Self-pay | Admitting: Family Medicine

## 2014-05-27 ENCOUNTER — Ambulatory Visit (INDEPENDENT_AMBULATORY_CARE_PROVIDER_SITE_OTHER): Payer: BLUE CROSS/BLUE SHIELD | Admitting: Obstetrics & Gynecology

## 2014-05-27 ENCOUNTER — Encounter: Payer: Self-pay | Admitting: Obstetrics & Gynecology

## 2014-05-27 VITALS — BP 163/93 | HR 61 | Ht 65.0 in | Wt 252.2 lb

## 2014-05-27 DIAGNOSIS — Z1151 Encounter for screening for human papillomavirus (HPV): Secondary | ICD-10-CM | POA: Diagnosis not present

## 2014-05-27 DIAGNOSIS — I10 Essential (primary) hypertension: Secondary | ICD-10-CM | POA: Diagnosis not present

## 2014-05-27 DIAGNOSIS — Z124 Encounter for screening for malignant neoplasm of cervix: Secondary | ICD-10-CM | POA: Diagnosis not present

## 2014-05-27 DIAGNOSIS — Z01419 Encounter for gynecological examination (general) (routine) without abnormal findings: Secondary | ICD-10-CM

## 2014-05-27 NOTE — Patient Instructions (Addendum)
Preventive Care for Adults A healthy lifestyle and preventive care can promote health and wellness. Preventive health guidelines for women include the following key practices.  A routine yearly physical is a good way to check with your health care provider about your health and preventive screening. It is a chance to share any concerns and updates on your health and to receive a thorough exam.  Visit your dentist for a routine exam and preventive care every 6 months. Brush your teeth twice a day and floss once a day. Good oral hygiene prevents tooth decay and gum disease.  The frequency of eye exams is based on your age, health, family medical history, use of contact lenses, and other factors. Follow your health care provider's recommendations for frequency of eye exams.  Eat a healthy diet. Foods like vegetables, fruits, whole grains, low-fat dairy products, and lean protein foods contain the nutrients you need without too many calories. Decrease your intake of foods high in solid fats, added sugars, and salt. Eat the right amount of calories for you.Get information about a proper diet from your health care provider, if necessary.  Regular physical exercise is one of the most important things you can do for your health. Most adults should get at least 150 minutes of moderate-intensity exercise (any activity that increases your heart rate and causes you to sweat) each week. In addition, most adults need muscle-strengthening exercises on 2 or more days a week.  Maintain a healthy weight. The body mass index (BMI) is a screening tool to identify possible weight problems. It provides an estimate of body fat based on height and weight. Your health care provider can find your BMI and can help you achieve or maintain a healthy weight.For adults 20 years and older:  A BMI below 18.5 is considered underweight.  A BMI of 18.5 to 24.9 is normal.  A BMI of 25 to 29.9 is considered overweight.  A BMI of  30 and above is considered obese.  Maintain normal blood lipids and cholesterol levels by exercising and minimizing your intake of saturated fat. Eat a balanced diet with plenty of fruit and vegetables. Blood tests for lipids and cholesterol should begin at age 76 and be repeated every 5 years. If your lipid or cholesterol levels are high, you are over 50, or you are at high risk for heart disease, you may need your cholesterol levels checked more frequently.Ongoing high lipid and cholesterol levels should be treated with medicines if diet and exercise are not working.  If you smoke, find out from your health care provider how to quit. If you do not use tobacco, do not start.  Lung cancer screening is recommended for adults aged 22-80 years who are at high risk for developing lung cancer because of a history of smoking. A yearly low-dose CT scan of the lungs is recommended for people who have at least a 30-pack-year history of smoking and are a current smoker or have quit within the past 15 years. A pack year of smoking is smoking an average of 1 pack of cigarettes a day for 1 year (for example: 1 pack a day for 30 years or 2 packs a day for 15 years). Yearly screening should continue until the smoker has stopped smoking for at least 15 years. Yearly screening should be stopped for people who develop a health problem that would prevent them from having lung cancer treatment.  If you are pregnant, do not drink alcohol. If you are breastfeeding,  be very cautious about drinking alcohol. If you are not pregnant and choose to drink alcohol, do not have more than 1 drink per day. One drink is considered to be 12 ounces (355 mL) of beer, 5 ounces (148 mL) of wine, or 1.5 ounces (44 mL) of liquor.  Avoid use of street drugs. Do not share needles with anyone. Ask for help if you need support or instructions about stopping the use of drugs.  High blood pressure causes heart disease and increases the risk of  stroke. Your blood pressure should be checked at least every 1 to 2 years. Ongoing high blood pressure should be treated with medicines if weight loss and exercise do not work.  If you are 75-52 years old, ask your health care provider if you should take aspirin to prevent strokes.  Diabetes screening involves taking a blood sample to check your fasting blood sugar level. This should be done once every 3 years, after age 15, if you are within normal weight and without risk factors for diabetes. Testing should be considered at a younger age or be carried out more frequently if you are overweight and have at least 1 risk factor for diabetes.  Breast cancer screening is essential preventive care for women. You should practice "breast self-awareness." This means understanding the normal appearance and feel of your breasts and may include breast self-examination. Any changes detected, no matter how small, should be reported to a health care provider. Women in their 58s and 30s should have a clinical breast exam (CBE) by a health care provider as part of a regular health exam every 1 to 3 years. After age 16, women should have a CBE every year. Starting at age 53, women should consider having a mammogram (breast X-ray test) every year. Women who have a family history of breast cancer should talk to their health care provider about genetic screening. Women at a high risk of breast cancer should talk to their health care providers about having an MRI and a mammogram every year.  Breast cancer gene (BRCA)-related cancer risk assessment is recommended for women who have family members with BRCA-related cancers. BRCA-related cancers include breast, ovarian, tubal, and peritoneal cancers. Having family members with these cancers may be associated with an increased risk for harmful changes (mutations) in the breast cancer genes BRCA1 and BRCA2. Results of the assessment will determine the need for genetic counseling and  BRCA1 and BRCA2 testing.  Routine pelvic exams to screen for cancer are no longer recommended for nonpregnant women who are considered low risk for cancer of the pelvic organs (ovaries, uterus, and vagina) and who do not have symptoms. Ask your health care provider if a screening pelvic exam is right for you.  If you have had past treatment for cervical cancer or a condition that could lead to cancer, you need Pap tests and screening for cancer for at least 20 years after your treatment. If Pap tests have been discontinued, your risk factors (such as having a new sexual partner) need to be reassessed to determine if screening should be resumed. Some women have medical problems that increase the chance of getting cervical cancer. In these cases, your health care provider may recommend more frequent screening and Pap tests.  The HPV test is an additional test that may be used for cervical cancer screening. The HPV test looks for the virus that can cause the cell changes on the cervix. The cells collected during the Pap test can be  tested for HPV. The HPV test could be used to screen women aged 30 years and older, and should be used in women of any age who have unclear Pap test results. After the age of 30, women should have HPV testing at the same frequency as a Pap test.  Colorectal cancer can be detected and often prevented. Most routine colorectal cancer screening begins at the age of 50 years and continues through age 75 years. However, your health care provider may recommend screening at an earlier age if you have risk factors for colon cancer. On a yearly basis, your health care provider may provide home test kits to check for hidden blood in the stool. Use of a small camera at the end of a tube, to directly examine the colon (sigmoidoscopy or colonoscopy), can detect the earliest forms of colorectal cancer. Talk to your health care provider about this at age 50, when routine screening begins. Direct  exam of the colon should be repeated every 5-10 years through age 75 years, unless early forms of pre-cancerous polyps or small growths are found.  People who are at an increased risk for hepatitis B should be screened for this virus. You are considered at high risk for hepatitis B if:  You were born in a country where hepatitis B occurs often. Talk with your health care provider about which countries are considered high risk.  Your parents were born in a high-risk country and you have not received a shot to protect against hepatitis B (hepatitis B vaccine).  You have HIV or AIDS.  You use needles to inject street drugs.  You live with, or have sex with, someone who has hepatitis B.  You get hemodialysis treatment.  You take certain medicines for conditions like cancer, organ transplantation, and autoimmune conditions.  Hepatitis C blood testing is recommended for all people born from 1945 through 1965 and any individual with known risks for hepatitis C.  Practice safe sex. Use condoms and avoid high-risk sexual practices to reduce the spread of sexually transmitted infections (STIs). STIs include gonorrhea, chlamydia, syphilis, trichomonas, herpes, HPV, and human immunodeficiency virus (HIV). Herpes, HIV, and HPV are viral illnesses that have no cure. They can result in disability, cancer, and death.  You should be screened for sexually transmitted illnesses (STIs) including gonorrhea and chlamydia if:  You are sexually active and are younger than 24 years.  You are older than 24 years and your health care provider tells you that you are at risk for this type of infection.  Your sexual activity has changed since you were last screened and you are at an increased risk for chlamydia or gonorrhea. Ask your health care provider if you are at risk.  If you are at risk of being infected with HIV, it is recommended that you take a prescription medicine daily to prevent HIV infection. This is  called preexposure prophylaxis (PrEP). You are considered at risk if:  You are a heterosexual woman, are sexually active, and are at increased risk for HIV infection.  You take drugs by injection.  You are sexually active with a partner who has HIV.  Talk with your health care provider about whether you are at high risk of being infected with HIV. If you choose to begin PrEP, you should first be tested for HIV. You should then be tested every 3 months for as long as you are taking PrEP.  Osteoporosis is a disease in which the bones lose minerals and strength   with aging. This can result in serious bone fractures or breaks. The risk of osteoporosis can be identified using a bone density scan. Women ages 65 years and over and women at risk for fractures or osteoporosis should discuss screening with their health care providers. Ask your health care provider whether you should take a calcium supplement or vitamin D to reduce the rate of osteoporosis.  Menopause can be associated with physical symptoms and risks. Hormone replacement therapy is available to decrease symptoms and risks. You should talk to your health care provider about whether hormone replacement therapy is right for you.  Use sunscreen. Apply sunscreen liberally and repeatedly throughout the day. You should seek shade when your shadow is shorter than you. Protect yourself by wearing long sleeves, pants, a wide-brimmed hat, and sunglasses year round, whenever you are outdoors.  Once a month, do a whole body skin exam, using a mirror to look at the skin on your back. Tell your health care provider of new moles, moles that have irregular borders, moles that are larger than a pencil eraser, or moles that have changed in shape or color.  Stay current with required vaccines (immunizations).  Influenza vaccine. All adults should be immunized every year.  Tetanus, diphtheria, and acellular pertussis (Td, Tdap) vaccine. Pregnant women should  receive 1 dose of Tdap vaccine during each pregnancy. The dose should be obtained regardless of the length of time since the last dose. Immunization is preferred during the 27th-36th week of gestation. An adult who has not previously received Tdap or who does not know her vaccine status should receive 1 dose of Tdap. This initial dose should be followed by tetanus and diphtheria toxoids (Td) booster doses every 10 years. Adults with an unknown or incomplete history of completing a 3-dose immunization series with Td-containing vaccines should begin or complete a primary immunization series including a Tdap dose. Adults should receive a Td booster every 10 years.  Varicella vaccine. An adult without evidence of immunity to varicella should receive 2 doses or a second dose if she has previously received 1 dose. Pregnant females who do not have evidence of immunity should receive the first dose after pregnancy. This first dose should be obtained before leaving the health care facility. The second dose should be obtained 4-8 weeks after the first dose.  Human papillomavirus (HPV) vaccine. Females aged 13-26 years who have not received the vaccine previously should obtain the 3-dose series. The vaccine is not recommended for use in pregnant females. However, pregnancy testing is not needed before receiving a dose. If a female is found to be pregnant after receiving a dose, no treatment is needed. In that case, the remaining doses should be delayed until after the pregnancy. Immunization is recommended for any person with an immunocompromised condition through the age of 26 years if she did not get any or all doses earlier. During the 3-dose series, the second dose should be obtained 4-8 weeks after the first dose. The third dose should be obtained 24 weeks after the first dose and 16 weeks after the second dose.  Zoster vaccine. One dose is recommended for adults aged 60 years or older unless certain conditions are  present.  Measles, mumps, and rubella (MMR) vaccine. Adults born before 1957 generally are considered immune to measles and mumps. Adults born in 1957 or later should have 1 or more doses of MMR vaccine unless there is a contraindication to the vaccine or there is laboratory evidence of immunity to   each of the three diseases. A routine second dose of MMR vaccine should be obtained at least 28 days after the first dose for students attending postsecondary schools, health care workers, or international travelers. People who received inactivated measles vaccine or an unknown type of measles vaccine during 1963-1967 should receive 2 doses of MMR vaccine. People who received inactivated mumps vaccine or an unknown type of mumps vaccine before 1979 and are at high risk for mumps infection should consider immunization with 2 doses of MMR vaccine. For females of childbearing age, rubella immunity should be determined. If there is no evidence of immunity, females who are not pregnant should be vaccinated. If there is no evidence of immunity, females who are pregnant should delay immunization until after pregnancy. Unvaccinated health care workers born before 1957 who lack laboratory evidence of measles, mumps, or rubella immunity or laboratory confirmation of disease should consider measles and mumps immunization with 2 doses of MMR vaccine or rubella immunization with 1 dose of MMR vaccine.  Pneumococcal 13-valent conjugate (PCV13) vaccine. When indicated, a person who is uncertain of her immunization history and has no record of immunization should receive the PCV13 vaccine. An adult aged 19 years or older who has certain medical conditions and has not been previously immunized should receive 1 dose of PCV13 vaccine. This PCV13 should be followed with a dose of pneumococcal polysaccharide (PPSV23) vaccine. The PPSV23 vaccine dose should be obtained at least 8 weeks after the dose of PCV13 vaccine. An adult aged 19  years or older who has certain medical conditions and previously received 1 or more doses of PPSV23 vaccine should receive 1 dose of PCV13. The PCV13 vaccine dose should be obtained 1 or more years after the last PPSV23 vaccine dose.  Pneumococcal polysaccharide (PPSV23) vaccine. When PCV13 is also indicated, PCV13 should be obtained first. All adults aged 65 years and older should be immunized. An adult younger than age 65 years who has certain medical conditions should be immunized. Any person who resides in a nursing home or long-term care facility should be immunized. An adult smoker should be immunized. People with an immunocompromised condition and certain other conditions should receive both PCV13 and PPSV23 vaccines. People with human immunodeficiency virus (HIV) infection should be immunized as soon as possible after diagnosis. Immunization during chemotherapy or radiation therapy should be avoided. Routine use of PPSV23 vaccine is not recommended for American Indians, [place] Natives, or people younger than 65 years unless there are medical conditions that require PPSV23 vaccine. When indicated, people who have unknown immunization and have no record of immunization should receive PPSV23 vaccine. One-time revaccination 5 years after the first dose of PPSV23 is recommended for people aged 19-64 years who have chronic kidney failure, nephrotic syndrome, asplenia, or immunocompromised conditions. People who received 1-2 doses of PPSV23 before age 65 years should receive another dose of PPSV23 vaccine at age 65 years or later if at least 5 years have passed since the previous dose. Doses of PPSV23 are not needed for people immunized with PPSV23 at or after age 65 years.  Meningococcal vaccine. Adults with asplenia or persistent complement component deficiencies should receive 2 doses of quadrivalent meningococcal conjugate (MenACWY-D) vaccine. The doses should be obtained at least 2 months apart.  Microbiologists working with certain meningococcal bacteria, military recruits, people at risk during an outbreak, and people who travel to or live in countries with a high rate of meningitis should be immunized. A first-year college student up through age   21 years who is living in a residence [place] should receive a dose if she did not receive a dose on or after her 16th birthday. Adults who have certain high-risk conditions should receive one or more doses of vaccine.  Hepatitis A vaccine. Adults who wish to be protected from this disease, have certain high-risk conditions, work with hepatitis A-infected animals, work in hepatitis A research labs, or travel to or work in countries with a high rate of hepatitis A should be immunized. Adults who were previously unvaccinated and who anticipate close contact with an international adoptee during the first 60 days after arrival in the [place] from a country with a high rate of hepatitis A should be immunized.  Hepatitis B vaccine. Adults who wish to be protected from this disease, have certain high-risk conditions, may be exposed to blood or other infectious body fluids, are household contacts or sex partners of hepatitis B positive people, are clients or workers in certain care facilities, or travel to or work in countries with a high rate of hepatitis B should be immunized.  Haemophilus influenzae type b (Hib) vaccine. A previously unvaccinated person with asplenia or sickle cell disease or having a scheduled splenectomy should receive 1 dose of Hib vaccine. Regardless of previous immunization, a recipient of a hematopoietic stem cell transplant should receive a 3-dose series 6-12 months after her successful transplant. Hib vaccine is not recommended for adults with HIV infection. Preventive Services / Frequency Ages 64 to 68 years  Blood pressure check.** / Every 1 to 2 years.  Lipid and cholesterol check.** / Every 5 years beginning at age  22.  Clinical breast exam.** / Every 3 years for women in their 88s and 53s.  BRCA-related cancer risk assessment.** / For women who have family members with a BRCA-related cancer (breast, ovarian, tubal, or peritoneal cancers).  Pap test.** / Every 2 years from ages 90 through 51. Every 3 years starting at age 21 through age 56 or 3 with a history of 3 consecutive normal Pap tests.  HPV screening.** / Every 3 years from ages 24 through ages 1 to 46 with a history of 3 consecutive normal Pap tests.  Hepatitis C blood test.** / For any individual with known risks for hepatitis C.  Skin self-exam. / Monthly.  Influenza vaccine. / Every year.  Tetanus, diphtheria, and acellular pertussis (Tdap, Td) vaccine.** / Consult your health care provider. Pregnant women should receive 1 dose of Tdap vaccine during each pregnancy. 1 dose of Td every 10 years.  Varicella vaccine.** / Consult your health care provider. Pregnant females who do not have evidence of immunity should receive the first dose after pregnancy.  HPV vaccine. / 3 doses over 6 months, if 72 and younger. The vaccine is not recommended for use in pregnant females. However, pregnancy testing is not needed before receiving a dose.  Measles, mumps, rubella (MMR) vaccine.** / You need at least 1 dose of MMR if you were born in 1957 or later. You may also need a 2nd dose. For females of childbearing age, rubella immunity should be determined. If there is no evidence of immunity, females who are not pregnant should be vaccinated. If there is no evidence of immunity, females who are pregnant should delay immunization until after pregnancy.  Pneumococcal 13-valent conjugate (PCV13) vaccine.** / Consult your health care provider.  Pneumococcal polysaccharide (PPSV23) vaccine.** / 1 to 2 doses if you smoke cigarettes or if you have certain conditions.  Meningococcal vaccine.** /  1 dose if you are age 19 to 21 years and a first-year college  student living in a residence [place], or have one of several medical conditions, you need to get vaccinated against meningococcal disease. You may also need additional booster doses.  Hepatitis A vaccine.** / Consult your health care provider.  Hepatitis B vaccine.** / Consult your health care provider.  Haemophilus influenzae type b (Hib) vaccine.** / Consult your health care provider. Ages 40 to 64 years  Blood pressure check.** / Every 1 to 2 years.  Lipid and cholesterol check.** / Every 5 years beginning at age 20 years.  Lung cancer screening. / Every year if you are aged 55-80 years and have a 30-pack-year history of smoking and currently smoke or have quit within the past 15 years. Yearly screening is stopped once you have quit smoking for at least 15 years or develop a health problem that would prevent you from having lung cancer treatment.  Clinical breast exam.** / Every year after age 40 years.  BRCA-related cancer risk assessment.** / For women who have family members with a BRCA-related cancer (breast, ovarian, tubal, or peritoneal cancers).  Mammogram.** / Every year beginning at age 40 years and continuing for as long as you are in good health. Consult with your health care provider.  Pap test.** / Every 3 years starting at age 30 years through age 65 or 70 years with a history of 3 consecutive normal Pap tests.  HPV screening.** / Every 3 years from ages 30 years through ages 65 to 70 years with a history of 3 consecutive normal Pap tests.  Fecal occult blood test (FOBT) of stool. / Every year beginning at age 50 years and continuing until age 75 years. You may not need to do this test if you get a colonoscopy every 10 years.  Flexible sigmoidoscopy or colonoscopy.** / Every 5 years for a flexible sigmoidoscopy or every 10 years for a colonoscopy beginning at age 50 years and continuing until age 75 years.  Hepatitis C blood test.** / For all people born from 1945 through  1965 and any individual with known risks for hepatitis C.  Skin self-exam. / Monthly.  Influenza vaccine. / Every year.  Tetanus, diphtheria, and acellular pertussis (Tdap/Td) vaccine.** / Consult your health care provider. Pregnant women should receive 1 dose of Tdap vaccine during each pregnancy. 1 dose of Td every 10 years.  Varicella vaccine.** / Consult your health care provider. Pregnant females who do not have evidence of immunity should receive the first dose after pregnancy.  Zoster vaccine.** / 1 dose for adults aged 60 years or older.  Measles, mumps, rubella (MMR) vaccine.** / You need at least 1 dose of MMR if you were born in 1957 or later. You may also need a 2nd dose. For females of childbearing age, rubella immunity should be determined. If there is no evidence of immunity, females who are not pregnant should be vaccinated. If there is no evidence of immunity, females who are pregnant should delay immunization until after pregnancy.  Pneumococcal 13-valent conjugate (PCV13) vaccine.** / Consult your health care provider.  Pneumococcal polysaccharide (PPSV23) vaccine.** / 1 to 2 doses if you smoke cigarettes or if you have certain conditions.  Meningococcal vaccine.** / Consult your health care provider.  Hepatitis A vaccine.** / Consult your health care provider.  Hepatitis B vaccine.** / Consult your health care provider.  Haemophilus influenzae type b (Hib) vaccine.** / Consult your health care provider. Ages 65   years and over  Blood pressure check.** / Every 1 to 2 years.  Lipid and cholesterol check.** / Every 5 years beginning at age 48 years.  Lung cancer screening. / Every year if you are aged 68-80 years and have a 30-pack-year history of smoking and currently smoke or have quit within the past 15 years. Yearly screening is stopped once you have quit smoking for at least 15 years or develop a health problem that would prevent you from having lung cancer  treatment.  Clinical breast exam.** / Every year after age 4 years.  BRCA-related cancer risk assessment.** / For women who have family members with a BRCA-related cancer (breast, ovarian, tubal, or peritoneal cancers).  Mammogram.** / Every year beginning at age 69 years and continuing for as long as you are in good health. Consult with your health care provider.  Pap test.** / Every 3 years starting at age 33 years through age 32 or 70 years with 3 consecutive normal Pap tests. Testing can be stopped between 65 and 70 years with 3 consecutive normal Pap tests and no abnormal Pap or HPV tests in the past 10 years.  HPV screening.** / Every 3 years from ages 32 years through ages 1 or 33 years with a history of 3 consecutive normal Pap tests. Testing can be stopped between 65 and 70 years with 3 consecutive normal Pap tests and no abnormal Pap or HPV tests in the past 10 years.  Fecal occult blood test (FOBT) of stool. / Every year beginning at age 77 years and continuing until age 27 years. You may not need to do this test if you get a colonoscopy every 10 years.  Flexible sigmoidoscopy or colonoscopy.** / Every 5 years for a flexible sigmoidoscopy or every 10 years for a colonoscopy beginning at age 61 years and continuing until age 68 years.  Hepatitis C blood test.** / For all people born from [birth year] through 1963 and any individual with known risks for hepatitis C.  Osteoporosis screening.** / A one-time screening for women ages 73 years and over and women at risk for fractures or osteoporosis.  Skin self-exam. / Monthly.  Influenza vaccine. / Every year.  Tetanus, diphtheria, and acellular pertussis (Tdap/Td) vaccine.** / 1 dose of Td every 10 years.  Varicella vaccine.** / Consult your health care provider.  Zoster vaccine.** / 1 dose for adults aged 67 years or older.  Pneumococcal 13-valent conjugate (PCV13) vaccine.** / Consult your health care provider.  Pneumococcal  polysaccharide (PPSV23) vaccine.** / 1 dose for all adults aged 43 years and older.  Meningococcal vaccine.** / Consult your health care provider.  Hepatitis A vaccine.** / Consult your health care provider.  Hepatitis B vaccine.** / Consult your health care provider.  Haemophilus influenzae type b (Hib) vaccine.** / Consult your health care provider. ** Family history and personal history of risk and conditions may change your health care provider's recommendations. Document Released: 04/27/2001 Document Revised: 07/16/2013 Document Reviewed: 07/27/2010 [facility] Patient Information 2015 [place], [place]. This information is not intended to replace advice given to you by your health care provider. Make sure you discuss any questions you have with your health care provider.  Thank you for enrolling in [facility]. Please follow the instructions below to securely access your online medical record. MyChart allows you to send messages to your doctor, view your test results, manage appointments, and more.   How Do I Sign Up? 1. In your [employer], go to [employer] and  enter [email or web address]. 2. Click on the Sign Up Now link in the Sign In box. You will see the New Member Sign Up page. 3. Enter your MyChart Access Code exactly as it appears below. You will not need to use this code after you've completed the sign-up process. If you do not sign up before the expiration date, you must request a new code.  MyChart Access Code: [email or web address] Expires: 07/26/2014  3:30 PM  4. Enter your Social Security Number ([email or web address]) and Date of Birth (mm/dd/yyyy) as indicated and click Submit. You will be taken to the next sign-up page. 5. Create a MyChart ID. This will be your MyChart login ID and cannot be changed, so think of one that is secure and easy to remember. 6. Create a MyChart password. You can change your password at any time. 7. Enter your Password Reset Question and Answer.  This can be used at a later time if you forget your password.  8. Enter your e-mail address. You will receive e-mail notification when new information is available in [facility]. 9. Click Sign Up. You can now view your medical record.   Additional Information Remember, MyChart is NOT to be used for urgent needs. For medical emergencies, dial 911.

## 2014-05-27 NOTE — Progress Notes (Signed)
    GYNECOLOGY CLINIC ANNUAL PREVENTATIVE CARE ENCOUNTER NOTE  Subjective:     Meagan Conway is a 54 y.o. [phone] female here for a routine annual gynecologic exam.  Current complaints: none.     Gynecologic History No LMP recorded. Patient has had an ablation. Contraception: vasectomy Last Pap: 05/14/13. Results were: normal Last mammogram: 06/04/13. Results were: normal  Obstetric History OB History  Gravida Para Term Preterm AB SAB TAB Ectopic Multiple Living  2 2 2       2     # Outcome Date GA Lbr Len/2nd Weight Sex Delivery Anes PTL Lv  2 Term 1998    M Vag-Spont   Y  1 Term 1988    M Vag-Spont   Y      History   Social History  . Marital Status: Married    Spouse Name: N/A  . Number of Children: N/A  . Years of Education: N/A   Occupational History  . Not on file.   Social History Main Topics  . Smoking status: Former Research scientist (life sciences)  . Smokeless tobacco: Not on file     Comment: quit many years ago.  . Alcohol Use: Yes     Comment: 3-4 times per week  . Drug Use: No  . Sexual Activity:    Partners: Male    Birth Control/ Protection: None     Comment: husband has a vasectomy   Other Topics Concern  . Not on file   Social History Narrative   Past Medical History  Diagnosis Date  . Hypertension 05/2005  . Depression     with anxious features  . Obesity    Patient Active Problem List   Diagnosis Date Noted  . Hyperglycemia 07/04/2013  . Routine general medical examination at a health care facility 06/04/2011  . GERD (gastroesophageal reflux disease) 03/10/2011  . Obesity 03/10/2011  . COLONIC POLYPS 04/22/2010  . ANXIETY DEPRESSION 02/28/2008   Past Surgical History  Procedure Laterality Date  . Vesico-vaginal fistula repair  1994  . Ovarian cyst surgery  08/2002  . Endometrial ablation w/ novasure      The following portions of the patient's history were reviewed and updated as appropriate: allergies, current medications, past family history,  past medical history, past social history, past surgical history and problem list.  Review of Systems A comprehensive review of systems was negative.   Objective:   BP 163/93 mmHg  Pulse 61  Ht 5\' 5"  (1.651 m)  Wt 252 lb 3.2 oz (114.397 kg)  BMI 41.97 kg/m2 GENERAL: Well-developed, well-nourished female in no acute distress.  HEENT: Normocephalic, atraumatic. Sclerae anicteric.  NECK: Supple. Normal thyroid.  LUNGS: Clear to auscultation bilaterally.  HEART: Regular rate and rhythm. BREASTS: Symmetric in size. No masses, skin changes, nipple drainage, or lymphadenopathy. ABDOMEN: Soft, obese, nontender, nondistended. No organomegaly. PELVIC: Normal external female genitalia. Vagina is pink and rugated.  Normal discharge. Normal cervix contour. Pap smear obtained. Unable to palpate uterus or adnexa due to habitus. EXTREMITIES: No cyanosis, clubbing, or edema, 2+ distal pulses.   Assessment:   Annual gynecologic examination Elevated BP   Plan:   Pap done, will follow up results and manage accordingly. Mammogram scheduled Follow up with PCP for management of hypertension Routine preventative health maintenance measures emphasized   Verita Schneiders, MD, FACOG Attending [place] for [place], [place]

## 2014-05-29 LAB — CYTOLOGY - PAP

## 2014-06-24 ENCOUNTER — Other Ambulatory Visit: Payer: Self-pay | Admitting: Family Medicine

## 2014-06-30 ENCOUNTER — Telehealth: Payer: Self-pay | Admitting: Family Medicine

## 2014-06-30 DIAGNOSIS — R739 Hyperglycemia, unspecified: Secondary | ICD-10-CM

## 2014-06-30 DIAGNOSIS — Z0000 Encounter for general adult medical examination without abnormal findings: Secondary | ICD-10-CM

## 2014-06-30 NOTE — Telephone Encounter (Signed)
-----   Message from Marchia Bond sent at 06/27/2014  7:46 AM EDT ----- Regarding: Pt has cpx labs on Mon 4/18, need orders. Please order  future cpx labs for pt's upcoming lab appt. Thanks Aniceto Boss

## 2014-07-01 ENCOUNTER — Other Ambulatory Visit (INDEPENDENT_AMBULATORY_CARE_PROVIDER_SITE_OTHER): Payer: BLUE CROSS/BLUE SHIELD

## 2014-07-01 DIAGNOSIS — R739 Hyperglycemia, unspecified: Secondary | ICD-10-CM | POA: Diagnosis not present

## 2014-07-01 DIAGNOSIS — Z0000 Encounter for general adult medical examination without abnormal findings: Secondary | ICD-10-CM | POA: Diagnosis not present

## 2014-07-01 LAB — CBC WITH DIFFERENTIAL/PLATELET
BASOS ABS: 0 10*3/uL (ref 0.0–0.1)
BASOS PCT: 0.4 % (ref 0.0–3.0)
EOS ABS: 0.1 10*3/uL (ref 0.0–0.7)
Eosinophils Relative: 1.4 % (ref 0.0–5.0)
HCT: 35.2 % — ABNORMAL LOW (ref 36.0–46.0)
Hemoglobin: 11.6 g/dL — ABNORMAL LOW (ref 12.0–15.0)
LYMPHS PCT: 17.3 % (ref 12.0–46.0)
Lymphs Abs: 1.7 10*3/uL (ref 0.7–4.0)
MCHC: 32.9 g/dL (ref 30.0–36.0)
MCV: 79.1 fl (ref 78.0–100.0)
MONOS PCT: 4.3 % (ref 3.0–12.0)
Monocytes Absolute: 0.4 10*3/uL (ref 0.1–1.0)
NEUTROS PCT: 76.6 % (ref 43.0–77.0)
Neutro Abs: 7.5 10*3/uL (ref 1.4–7.7)
Platelets: 306 10*3/uL (ref 150.0–400.0)
RBC: 4.45 Mil/uL (ref 3.87–5.11)
RDW: 15.3 % (ref 11.5–15.5)
WBC: 9.8 10*3/uL (ref 4.0–10.5)

## 2014-07-01 LAB — COMPREHENSIVE METABOLIC PANEL
ALBUMIN: 4.1 g/dL (ref 3.5–5.2)
ALK PHOS: 84 U/L (ref 39–117)
ALT: 14 U/L (ref 0–35)
AST: 18 U/L (ref 0–37)
BUN: 15 mg/dL (ref 6–23)
CHLORIDE: 106 meq/L (ref 96–112)
CO2: 26 mEq/L (ref 19–32)
CREATININE: 0.9 mg/dL (ref 0.40–1.20)
Calcium: 9.4 mg/dL (ref 8.4–10.5)
GFR: 70.05 mL/min (ref >60.00)
Glucose, Bld: 106 mg/dL — ABNORMAL HIGH (ref 70–99)
POTASSIUM: 4 meq/L (ref 3.5–5.1)
Sodium: 140 mEq/L (ref 135–145)
Total Bilirubin: 0.3 mg/dL (ref 0.2–1.2)
Total Protein: 6.8 g/dL (ref 6.0–8.3)

## 2014-07-01 LAB — HEMOGLOBIN A1C: Hgb A1c MFr Bld: 6.1 % (ref 4.6–6.5)

## 2014-07-01 LAB — LIPID PANEL
Cholesterol: 164 mg/dL (ref 0–200)
HDL: 64.1 mg/dL (ref >39.00)
LDL Cholesterol: 85 mg/dL (ref 0–99)
NONHDL: 99.9
TRIGLYCERIDES: 77 mg/dL (ref 0.0–149.0)
Total CHOL/HDL Ratio: 3
VLDL: 15.4 mg/dL (ref 0.0–40.0)

## 2014-07-01 LAB — TSH: TSH: 2.21 u[IU]/mL (ref 0.35–4.50)

## 2014-07-01 NOTE — Telephone Encounter (Signed)
Opened in error

## 2014-07-08 ENCOUNTER — Encounter: Payer: Self-pay | Admitting: Family Medicine

## 2014-07-08 ENCOUNTER — Ambulatory Visit (INDEPENDENT_AMBULATORY_CARE_PROVIDER_SITE_OTHER): Payer: BLUE CROSS/BLUE SHIELD | Admitting: Family Medicine

## 2014-07-08 VITALS — BP 135/81 | HR 68 | Temp 98.9°F | Ht 64.5 in | Wt 250.2 lb

## 2014-07-08 DIAGNOSIS — E669 Obesity, unspecified: Secondary | ICD-10-CM | POA: Diagnosis not present

## 2014-07-08 DIAGNOSIS — Z0000 Encounter for general adult medical examination without abnormal findings: Secondary | ICD-10-CM | POA: Diagnosis not present

## 2014-07-08 DIAGNOSIS — R739 Hyperglycemia, unspecified: Secondary | ICD-10-CM

## 2014-07-08 DIAGNOSIS — Z1211 Encounter for screening for malignant neoplasm of colon: Secondary | ICD-10-CM | POA: Insufficient documentation

## 2014-07-08 NOTE — Assessment & Plan Note (Signed)
Discussed how this problem influences overall health and the risks it imposes  Reviewed plan for weight loss with lower calorie diet (via better food choices and also portion control or program like weight watchers) and exercise building up to or more than 30 minutes 5 days per week including some aerobic activity    

## 2014-07-08 NOTE — Progress Notes (Signed)
Pre visit review using our clinic review tool, if applicable. No additional management support is needed unless otherwise documented below in the visit note. 

## 2014-07-08 NOTE — Assessment & Plan Note (Signed)
utd colonosc until 2017 Hx of hyperplastic polyp slt anemia IFOB given  No symptoms

## 2014-07-08 NOTE — Progress Notes (Signed)
Subjective:    Patient ID: Meagan Conway, female    DOB: 07-19-1960, 54 y.o.   MRN: [id number]  HPI Here for health maintenance exam and to review chronic medical problems    Feeling ok overall  Working a lot    [employer] is down 2 lb with bmi of 42 However she has lost 6 lb since she saw gyn last mo  Eating better and some herbal life shakes - is walking with a group also  Obese  Hep C/HIV screen - not high risk -declines   Mammogram - had it last Monday- we do not have a report -thinks it was nl  Self exam-no lumps  Pap 3/16-normal  Done with menses at this time  Having hot flashes-getting by   Td 9/08 Flu vaccine - did not get this year   colonosc 12/07 with hyperplastic polyp- 10 year recall   bp high on first check today BP Readings from Last 3 Encounters:  07/08/14 144/88  05/27/14 163/93  07/04/13 118/82  has a family hx of HTN  Has been on bp med in the past     Results for orders placed or performed in visit on 07/01/14  CBC with Differential/Platelet  Result Value Ref Range   WBC 9.8 4.0 - 10.5 K/uL   RBC 4.45 3.87 - 5.11 Mil/uL   Hemoglobin 11.6 (L) 12.0 - 15.0 g/dL   HCT 35.2 (L) 36.0 - 46.0 %   MCV 79.1 78.0 - 100.0 fl   MCHC 32.9 30.0 - 36.0 g/dL   RDW 15.3 11.5 - 15.5 %   Platelets 306.0 150.0 - 400.0 K/uL   Neutrophils Relative % 76.6 43.0 - 77.0 %   Lymphocytes Relative 17.3 12.0 - 46.0 %   Monocytes Relative 4.3 3.0 - 12.0 %   Eosinophils Relative 1.4 0.0 - 5.0 %   Basophils Relative 0.4 0.0 - 3.0 %   Neutro Abs 7.5 1.4 - 7.7 K/uL   Lymphs Abs 1.7 0.7 - 4.0 K/uL   Monocytes Absolute 0.4 0.1 - 1.0 K/uL   Eosinophils Absolute 0.1 0.0 - 0.7 K/uL   Basophils Absolute 0.0 0.0 - 0.1 K/uL  Comprehensive metabolic panel  Result Value Ref Range   Sodium 140 135 - 145 mEq/L   Potassium 4.0 3.5 - 5.1 mEq/L   Chloride 106 96 - 112 mEq/L   CO2 26 19 - 32 mEq/L   Glucose, Bld 106 (H) 70 - 99 mg/dL   BUN 15 6 - 23 mg/dL   Creatinine, Ser 0.90 0.40  - 1.20 mg/dL   Total Bilirubin 0.3 0.2 - 1.2 mg/dL   Alkaline Phosphatase 84 39 - 117 U/L   AST 18 0 - 37 U/L   ALT 14 0 - 35 U/L   Total Protein 6.8 6.0 - 8.3 g/dL   Albumin 4.1 3.5 - 5.2 g/dL   Calcium 9.4 8.4 - 10.5 mg/dL   GFR 70.05 >60.00 mL/min  Hemoglobin A1c  Result Value Ref Range   Hgb A1c MFr Bld 6.1 4.6 - 6.5 %  Lipid panel  Result Value Ref Range   Cholesterol 164 0 - 200 mg/dL   Triglycerides 77.0 0.0 - 149.0 mg/dL   HDL 64.10 >39.00 mg/dL   VLDL 15.4 0.0 - 40.0 mg/dL   LDL Cholesterol 85 0 - 99 mg/dL   Total CHOL/HDL Ratio 3    NonHDL 99.90   TSH  Result Value Ref Range   TSH 2.21 0.35 - 4.50 uIU/mL  Has not given blood in the past 6 mo   A1C is 6.1- is hyperglycemic  She is a sweet eater in spells - not a lot however   Patient Active Problem List   Diagnosis Date Noted  . Colon cancer screening 07/08/2014  . Hyperglycemia 07/04/2013  . Routine general medical examination at a health care facility 06/04/2011  . GERD (gastroesophageal reflux disease) 03/10/2011  . Obesity 03/10/2011  . COLONIC POLYPS 04/22/2010  . ANXIETY DEPRESSION 02/28/2008   Past Medical History  Diagnosis Date  . Hypertension 05/2005  . Depression     with anxious features  . Obesity    Past Surgical History  Procedure Laterality Date  . Vesico-vaginal fistula repair  1994  . Ovarian cyst surgery  08/2002  . Endometrial ablation w/ novasure     History  Substance Use Topics  . Smoking status: Former Research scientist (life sciences)  . Smokeless tobacco: Not on file     Comment: quit many years ago.  . Alcohol Use: Yes     Comment: 3-4 times per week   Family History  Problem Relation Age of Onset  . Hypertension Mother   . Hypertension Father   . Heart disease Father 44    MI  . Alcohol abuse Father   . Heart disease Maternal Grandmother   . Heart disease Paternal Grandmother    No Known Allergies Current Outpatient Prescriptions on File Prior to Visit  Medication Sig Dispense  Refill  . FLUoxetine (PROZAC) 20 MG capsule TAKE 1 CAPSULE DAILY 90 capsule 3  . pantoprazole (PROTONIX) 40 MG tablet TAKE 1 TABLET DAILY 90 tablet 3   No current facility-administered medications on file prior to visit.    Review of Systems    Review of Systems  Constitutional: Negative for fever, appetite change, fatigue and unexpected weight change.  ENT pos for allergy cong and rhinorrhea  Eyes: Negative for pain and visual disturbance.  Respiratory: Negative for cough and shortness of breath.   Cardiovascular: Negative for cp or palpitations    Gastrointestinal: Negative for nausea, diarrhea and constipation.  Genitourinary: Negative for urgency and frequency.  Skin: Negative for pallor or rash   Neurological: Negative for weakness, light-headedness, numbness and headaches.  Hematological: Negative for adenopathy. Does not bruise/bleed easily.  Psychiatric/Behavioral: Negative for dysphoric mood. The patient is not nervous/anxious.      Objective:   Physical Exam  Constitutional: She appears well-developed and well-nourished. No distress.  obese and well appearing   HENT:  Head: Normocephalic and atraumatic.  Right Ear: External ear normal.  Left Ear: External ear normal.  Mouth/Throat: Oropharynx is clear and moist.  Nares are boggy  Eyes: Conjunctivae and EOM are normal. Pupils are equal, round, and reactive to light. Right eye exhibits no discharge. Left eye exhibits no discharge. No scleral icterus.  Neck: Normal range of motion. Neck supple. No JVD present. Carotid bruit is not present. No thyromegaly present.  Cardiovascular: Normal rate, regular rhythm, normal heart sounds and intact distal pulses.  Exam reveals no gallop.   Pulmonary/Chest: Effort normal and breath sounds normal. No respiratory distress. She has no wheezes. She has no rales.  Abdominal: Soft. Bowel sounds are normal. She exhibits no distension and no mass. There is no tenderness.  Musculoskeletal:  She exhibits no edema or tenderness.  Lymphadenopathy:    She has no cervical adenopathy.  Neurological: She is alert. She has normal reflexes. No cranial nerve deficit. She exhibits normal muscle tone. Coordination normal.  Skin: Skin is warm and dry. No rash noted. No erythema. No pallor.  Solar lentigos diffusely   Psychiatric: She has a normal mood and affect.          Assessment & Plan:   Problem List Items Addressed This Visit      Other   Colon cancer screening    utd colonosc until 2017 Hx of hyperplastic polyp slt anemia IFOB given  No symptoms       Relevant Orders   Fecal occult blood, imunochemical   Hyperglycemia    Lab Results  Component Value Date   HGBA1C 6.1 07/01/2014   Disc pre diabetes and risk of DM2 Plan for wt loss Low glycemic diet discussed F/u 6 mo lab prior       Obesity    Discussed how this problem influences overall health and the risks it imposes  Reviewed plan for weight loss with lower calorie diet (via better food choices and also portion control or program like weight watchers) and exercise building up to or more than 30 minutes 5 days per week including some aerobic activity         Routine general medical examination at a health care facility - Primary    Reviewed health habits including diet and exercise and skin cancer prevention Reviewed appropriate screening tests for age  Also reviewed health mt list, fam hx and immunization status , as well as social and family history   Labs rev  See HPI Disc strategy for wt loss

## 2014-07-08 NOTE — Assessment & Plan Note (Signed)
Reviewed health habits including diet and exercise and skin cancer prevention Reviewed appropriate screening tests for age  Also reviewed health mt list, fam hx and immunization status , as well as social and family history   Labs rev  See HPI Disc strategy for wt loss

## 2014-07-08 NOTE — Assessment & Plan Note (Signed)
Lab Results  Component Value Date   HGBA1C 6.1 07/01/2014   Disc pre diabetes and risk of DM2 Plan for wt loss Low glycemic diet discussed F/u 6 mo lab prior

## 2014-07-08 NOTE — Patient Instructions (Addendum)
Blood pressure is better on 2nd check  Please do stool cards for colon cancer screening  You have pre diabetes/ hyperglycemia - work on low sugar diet and weight loss  Follow up in 6 months with labs prior   Take care of yourself

## 2014-10-29 IMAGING — CT CT HEAD W/O CM
5 of 7 series · 16 of 37 positions shown, 18 images · non-contrast
Comparison: None

CT HEAD

CLINICAL DATA: Left arm numbness and tingling and left hand
tremors.

CT HEAD WITHOUT CONTRAST
CT CERVICAL SPINE WITHOUT CONTRAST
TECHNIQUE: Multidetector CT imaging of the head and cervical spine
was performed following the standard protocol without intravenous
contrast.  Multiplanar CT image reconstructions of the cervical
spine were also generated.

[Series 2: brain · axial · 0.49mm/px · z∈[+186,+232]mm · 2 of 28 slices shown]
[im 10/28  brain]
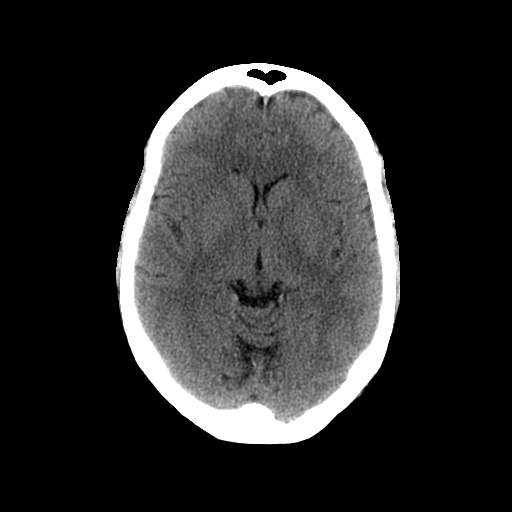
[im 19/28  brain]
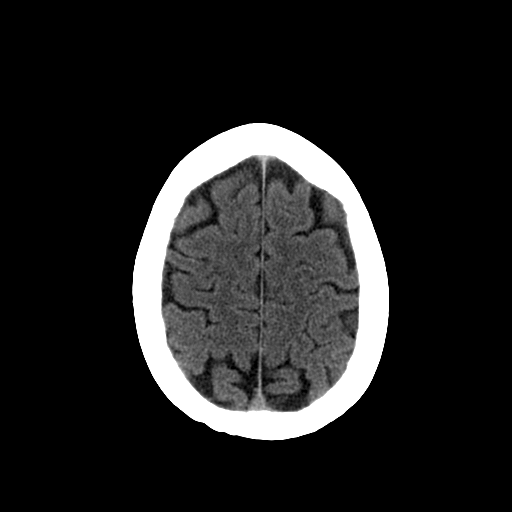

[Series 4: cervical spine · axial · 0.27mm/px · z∈[-24,+69]mm · 4 of 63 slices shown]
[im 13/63  brain]
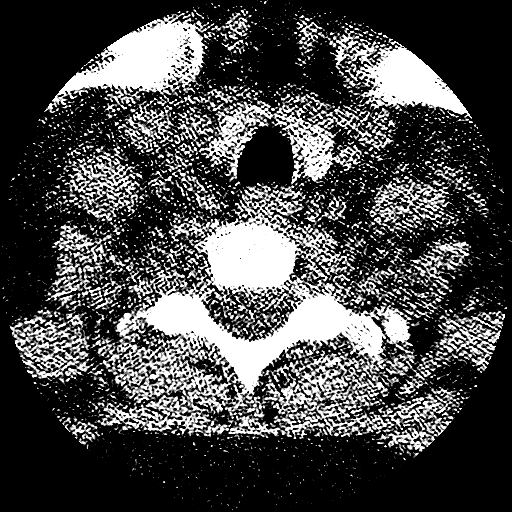
[im 25/63  brain]
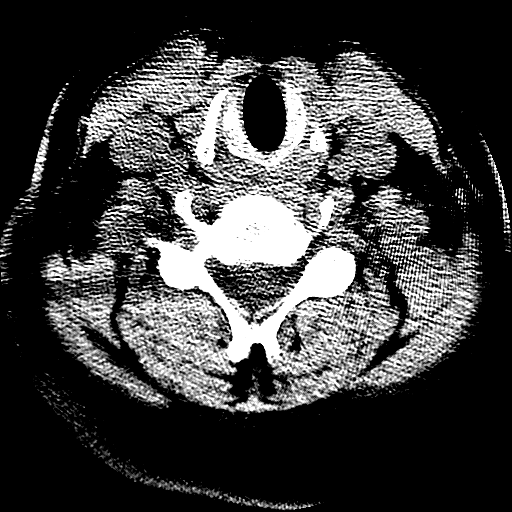
[im 38/63  brain]
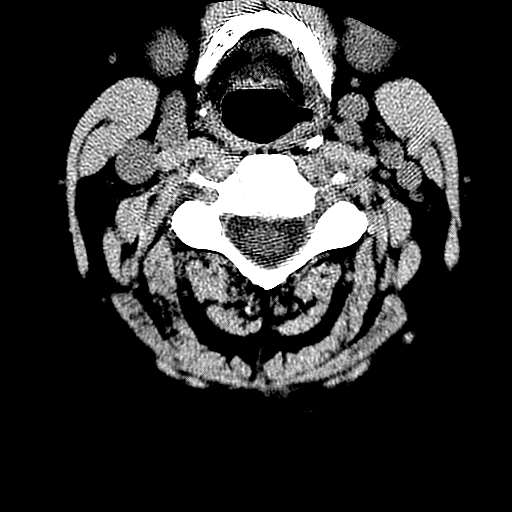
[im 50/63  brain]
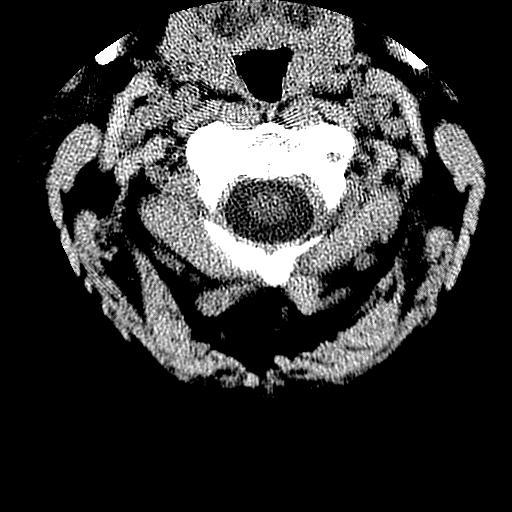

[Series 600: sagittal · sagittal · 0.49mm/px · 2 of 42 slices shown]
[im 14/42  brain]
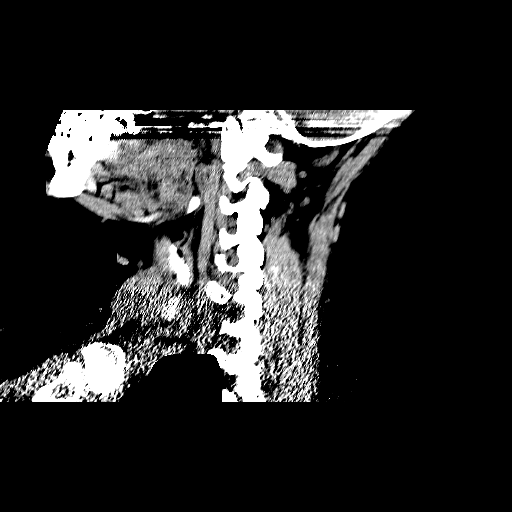
[im 28/42  brain]
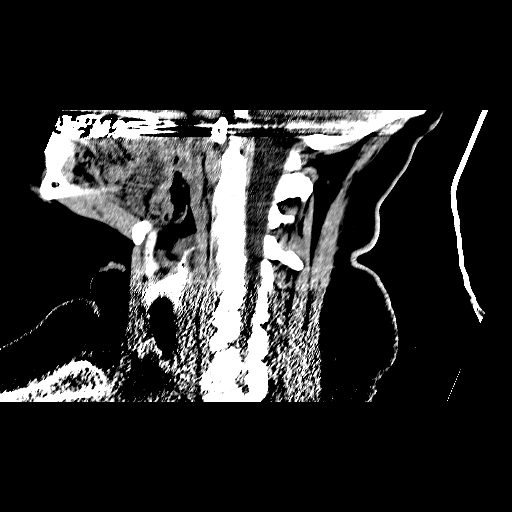

[Series 601: coronal · coronal · 0.49mm/px · 3 of 42 slices shown]
[im 5/42  brain]
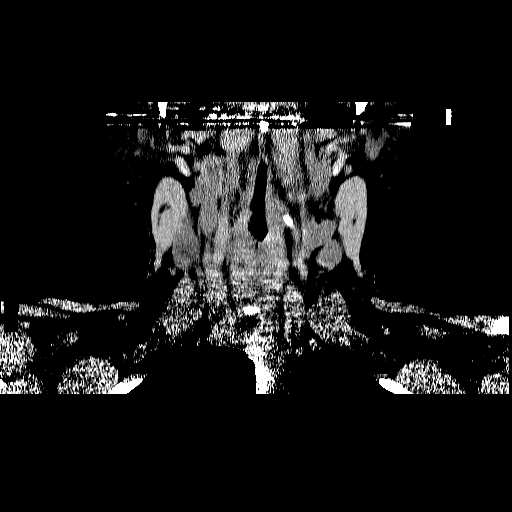
[im 6/42  brain]
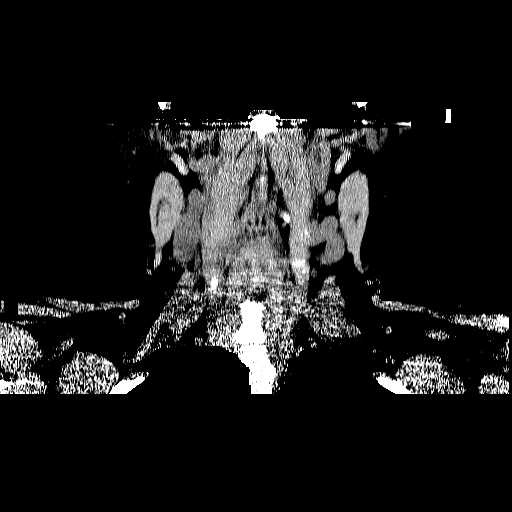
[im 8/42  brain]
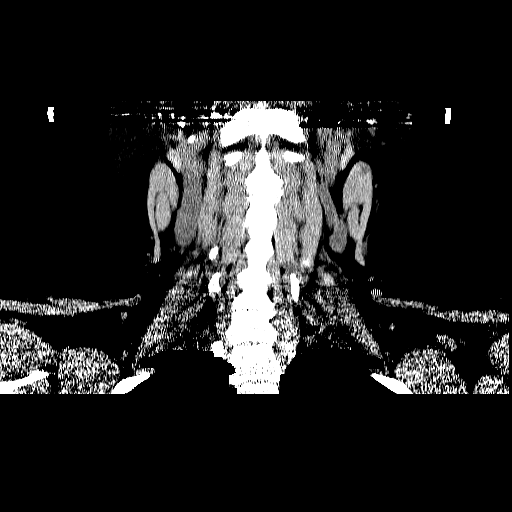

[Series 602: orthogonal · axial · 0.27mm/px · z∈[-16,+72]mm · 5 of 73 slices shown, 7 images]
[im 13/73  brain]
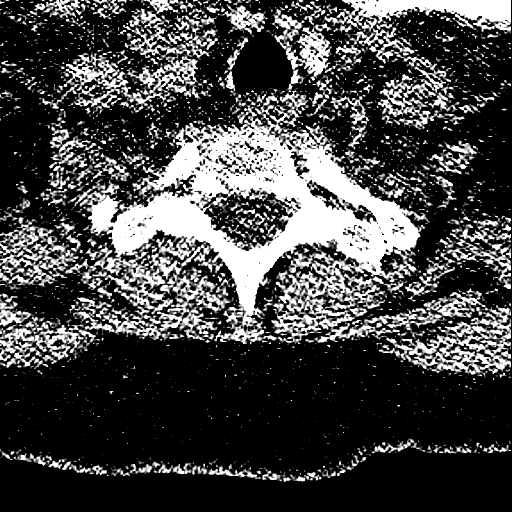
[im 13/73  bone]
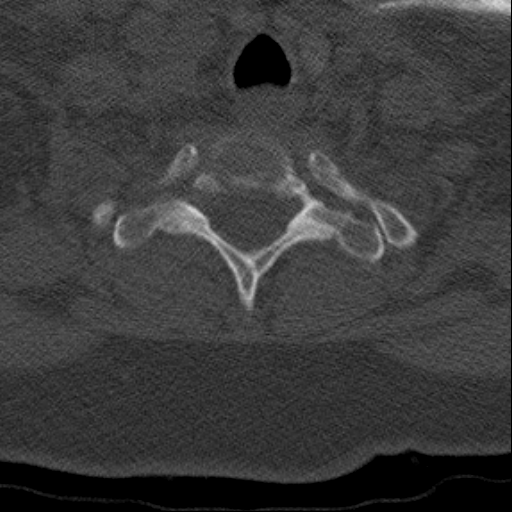
[im 25/73  brain]
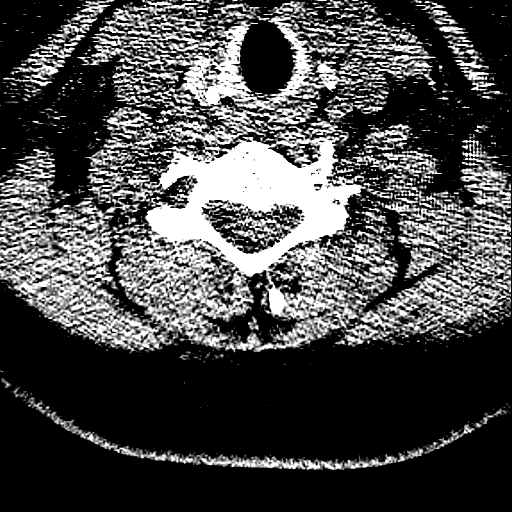
[im 37/73  brain]
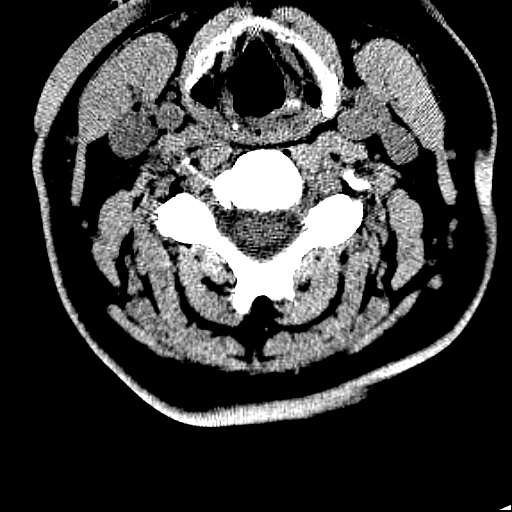
[im 49/73  brain]
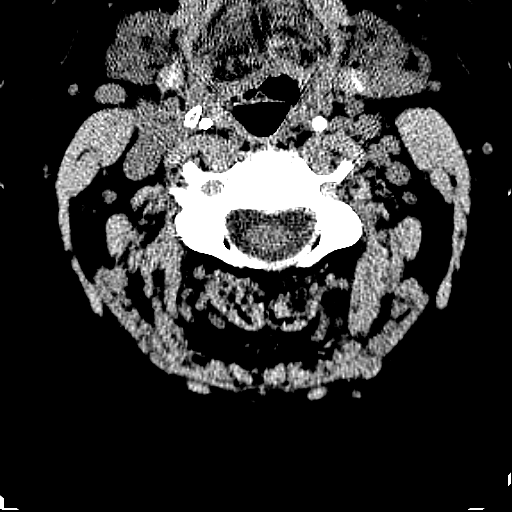
[im 61/73  brain]
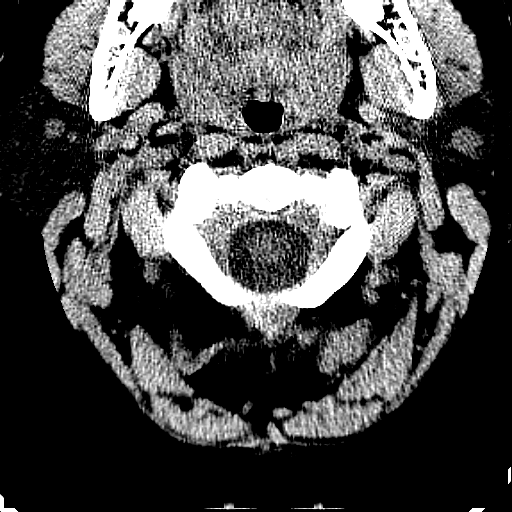
[im 61/73  bone]
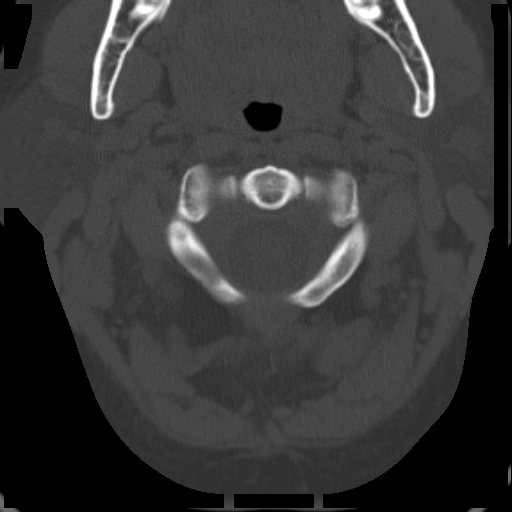

[16 of 37 positions shown; findings below may reference images not displayed]

FINDINGS: Normal appearing cerebral hemispheres and posterior fossa
structures.  Normal size and position of the ventricles.  No
intracranial hemorrhage, mass lesion or CT evidence of acute
infarction.
IMPRESSION: Normal examination.

CT CERVICAL SPINE
FINDINGS: Mild reversal of the normal cervical lordosis.  Minimal
scoliosis.

C2-3:  Mild posterior disc bulging centrally.

C3-4:  Mild posterior disc bulging centrally.

C4-5:  Minimal posterior disc bulging and spur formation.

C5-6:  Moderate posterior spur formation and mild anterior spur
formation without significant canal or foraminal stenosis.

C6-7:  Moderate posterior spur formation and mild anterior spur
formation with minimal canal stenosis and no foraminal stenosis.

C7-T1:  Unremarkable.
IMPRESSION: Degenerative changes, as described above.  No evidence of neural
compression.

## 2014-12-29 ENCOUNTER — Telehealth: Payer: Self-pay | Admitting: Family Medicine

## 2014-12-29 DIAGNOSIS — D649 Anemia, unspecified: Secondary | ICD-10-CM

## 2014-12-29 DIAGNOSIS — D509 Iron deficiency anemia, unspecified: Secondary | ICD-10-CM | POA: Insufficient documentation

## 2014-12-29 DIAGNOSIS — R739 Hyperglycemia, unspecified: Secondary | ICD-10-CM

## 2014-12-29 NOTE — Telephone Encounter (Signed)
-----   Message from Ellamae Sia sent at 12/25/2014  3:38 PM EDT ----- Regarding: Lab orders for Tuesday, 10.18.16 Lab orders for a 6 month follow up appt

## 2014-12-31 ENCOUNTER — Other Ambulatory Visit: Payer: BLUE CROSS/BLUE SHIELD

## 2015-01-01 ENCOUNTER — Other Ambulatory Visit (INDEPENDENT_AMBULATORY_CARE_PROVIDER_SITE_OTHER): Payer: BLUE CROSS/BLUE SHIELD

## 2015-01-01 DIAGNOSIS — D649 Anemia, unspecified: Secondary | ICD-10-CM

## 2015-01-01 DIAGNOSIS — R739 Hyperglycemia, unspecified: Secondary | ICD-10-CM | POA: Diagnosis not present

## 2015-01-01 LAB — CBC WITH DIFFERENTIAL/PLATELET
BASOS ABS: 0 10*3/uL (ref 0.0–0.1)
Basophils Relative: 0.6 % (ref 0.0–3.0)
EOS ABS: 0.1 10*3/uL (ref 0.0–0.7)
Eosinophils Relative: 1.9 % (ref 0.0–5.0)
HCT: 36.9 % (ref 36.0–46.0)
Hemoglobin: 12.1 g/dL (ref 12.0–15.0)
LYMPHS ABS: 1.9 10*3/uL (ref 0.7–4.0)
Lymphocytes Relative: 23.6 % (ref 12.0–46.0)
MCHC: 32.7 g/dL (ref 30.0–36.0)
MCV: 78.8 fl (ref 78.0–100.0)
MONOS PCT: 5.1 % (ref 3.0–12.0)
Monocytes Absolute: 0.4 10*3/uL (ref 0.1–1.0)
NEUTROS ABS: 5.4 10*3/uL (ref 1.4–7.7)
NEUTROS PCT: 68.8 % (ref 43.0–77.0)
PLATELETS: 312 10*3/uL (ref 150.0–400.0)
RBC: 4.68 Mil/uL (ref 3.87–5.11)
RDW: 17.1 % — ABNORMAL HIGH (ref 11.5–15.5)
WBC: 7.9 10*3/uL (ref 4.0–10.5)

## 2015-01-01 LAB — BASIC METABOLIC PANEL
BUN: 15 mg/dL (ref 6–23)
CO2: 27 mEq/L (ref 19–32)
Calcium: 9.5 mg/dL (ref 8.4–10.5)
Chloride: 106 mEq/L (ref 96–112)
Creatinine, Ser: 0.9 mg/dL (ref 0.40–1.20)
GFR: 69.91 mL/min (ref >60.00)
GLUCOSE: 110 mg/dL — AB (ref 70–99)
POTASSIUM: 4.1 meq/L (ref 3.5–5.1)
SODIUM: 142 meq/L (ref 135–145)

## 2015-01-01 LAB — HEMOGLOBIN A1C: HEMOGLOBIN A1C: 6.1 % (ref 4.6–6.5)

## 2015-01-07 ENCOUNTER — Encounter: Payer: Self-pay | Admitting: Family Medicine

## 2015-01-07 ENCOUNTER — Ambulatory Visit (INDEPENDENT_AMBULATORY_CARE_PROVIDER_SITE_OTHER): Payer: BLUE CROSS/BLUE SHIELD | Admitting: Family Medicine

## 2015-01-07 VITALS — BP 118/72 | HR 77 | Temp 98.5°F | Ht 64.5 in | Wt 251.5 lb

## 2015-01-07 DIAGNOSIS — E669 Obesity, unspecified: Secondary | ICD-10-CM | POA: Diagnosis not present

## 2015-01-07 DIAGNOSIS — Z23 Encounter for immunization: Secondary | ICD-10-CM

## 2015-01-07 DIAGNOSIS — R739 Hyperglycemia, unspecified: Secondary | ICD-10-CM | POA: Diagnosis not present

## 2015-01-07 NOTE — Assessment & Plan Note (Signed)
Stable Lab Results  Component Value Date   HGBA1C 6.1 01/01/2015   Disc risk of DM  Goal- to loose wt and eat low glycemic diet Re check 6 mo  Rev plan for lifestyle change

## 2015-01-07 NOTE — Progress Notes (Signed)
Pre visit review using our clinic review tool, if applicable. No additional management support is needed unless otherwise documented below in the visit note. 

## 2015-01-07 NOTE — Progress Notes (Signed)
Subjective:    Patient ID: Meagan Conway, female    DOB: [birth year], 54 y.o.   MRN: [id number]  HPI Here for f/u of chronic health problems   Doing ok overall  Trying to take care of herself  Very stressed - a lot going on  Job is on the line - a lot of lay offs   Has a grand baby that is due today  Need Tdap vaccine  Also a flu shot    Wt is up 1 lb with bmi of 42- obese  Hyperglycemia found at last visit Lab Results  Component Value Date   HGBA1C 6.1 01/01/2015   This is unchanged Random glucose is 110  No lifestyle changes yet   (no time in her schedule)  No fam hx of DM    For exercise -have equipment at home and a good neighborhood to walk in  Could fit in afternoon before work  [employer] also   Does not eat a lot of sugar  No soda or sweet tea  Drinks wine or beer - 4 times per week 1-2 servings   carbs-not a lot of bread/ some pasta/potato        Chemistry      Component Value Date/Time   NA 142 01/01/2015 0827   K 4.1 01/01/2015 0827   CL 106 01/01/2015 0827   CO2 27 01/01/2015 0827   BUN 15 01/01/2015 0827   CREATININE 0.90 01/01/2015 0827      Component Value Date/Time   CALCIUM 9.5 01/01/2015 0827   ALKPHOS 84 07/01/2014 0741   AST 18 07/01/2014 0741   ALT 14 07/01/2014 0741   BILITOT 0.3 07/01/2014 0741        Review of Systems Review of Systems  Constitutional: Negative for fever, appetite change, and unexpected weight change.  Eyes: Negative for pain and visual disturbance.  Respiratory: Negative for cough and shortness of breath.   Cardiovascular: Negative for cp or palpitations    Gastrointestinal: Negative for nausea, diarrhea and constipation.  Genitourinary: Negative for urgency and frequency.  Skin: Negative for pallor or rash   Neurological: Negative for weakness, light-headedness, numbness and headaches.  Hematological: Negative for adenopathy. Does not bruise/bleed easily.  Psychiatric/Behavioral:  Negative for dysphoric mood. The patient is not nervous/anxious.  Pos for stressors        Objective:   Physical Exam  Constitutional: She appears well-developed and well-nourished. No distress.  obese and well appearing   HENT:  Head: Normocephalic and atraumatic.  Mouth/Throat: Oropharynx is clear and moist.  Eyes: Conjunctivae and EOM are normal. Pupils are equal, round, and reactive to light.  Neck: Normal range of motion. Neck supple. No JVD present. Carotid bruit is not present. No thyromegaly present.  Cardiovascular: Normal rate, regular rhythm, normal heart sounds and intact distal pulses.  Exam reveals no gallop.   Pulmonary/Chest: Effort normal and breath sounds normal. No respiratory distress. She has no wheezes. She has no rales.  No crackles  Abdominal: Soft. Bowel sounds are normal. She exhibits no distension, no abdominal bruit and no mass. There is no tenderness.  Musculoskeletal: She exhibits no edema.  Lymphadenopathy:    She has no cervical adenopathy.  Neurological: She is alert. She has normal reflexes. She exhibits normal muscle tone. Coordination normal.  Skin: Skin is warm and dry. No rash noted.  Psychiatric: She has a normal mood and affect.          Assessment &  Plan:   Problem List Items Addressed This Visit      Other   Hyperglycemia    Stable Lab Results  Component Value Date   HGBA1C 6.1 01/01/2015   Disc risk of DM  Goal- to loose wt and eat low glycemic diet Re check 6 mo  Rev plan for lifestyle change      Obesity - Primary    Discussed how this problem influences overall health and the risks it imposes  Reviewed plan for weight loss with lower calorie diet (via better food choices and also portion control or program like weight watchers) and exercise building up to or more than 30 minutes 5 days per week including some aerobic activity   Worked on plan for exercise 5 d per week and reduced calorie/low glycemic diet  Wt loss will  help prevent DM       Other Visit Diagnoses    Need for Tdap vaccination        Relevant Orders    Tdap vaccine greater than or equal to 7yo IM (Completed)    Need for influenza vaccination        Relevant Orders    Flu Vaccine QUAD 36+ mos PF IM (Fluarix & Fluzone Quad PF) (Completed)

## 2015-01-07 NOTE — Patient Instructions (Signed)
Make a plan for exercise -goal of 30 minutes 5 days per week (work up to it) -more is better  Also - avoid sugars (sweets) and sweet drinks  Control carbohydrates   Labs are stable   Flu shot and Tdap today (your family should be immunized for the new baby)  Follow up in 6 months for annual exam with labs prior

## 2015-01-07 NOTE — Assessment & Plan Note (Signed)
Discussed how this problem influences overall health and the risks it imposes  Reviewed plan for weight loss with lower calorie diet (via better food choices and also portion control or program like weight watchers) and exercise building up to or more than 30 minutes 5 days per week including some aerobic activity   Worked on plan for exercise 5 d per week and reduced calorie/low glycemic diet  Wt loss will help prevent DM

## 2015-03-19 IMAGING — CT CT HEAD WITHOUT CONTRAST
1 series · 16 of 28 positions shown, 20 images · non-contrast
Comparison: none

REASON FOR EXAM: CALL REPORT [id number] HEAD INJURY FACIAL BRUSING HEADACHES
COMMENTS:

[Series 2: soft tissue · axial · 0.43mm/px · z∈[-21,+104]mm · 16 of 28 slices shown, 20 images]
[im 2/28  brain]
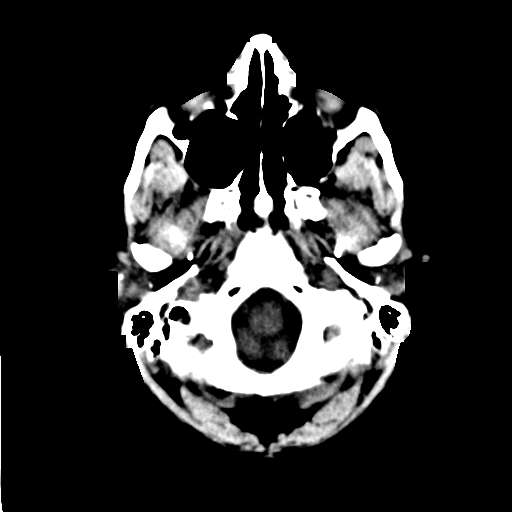
[im 2/28  bone]
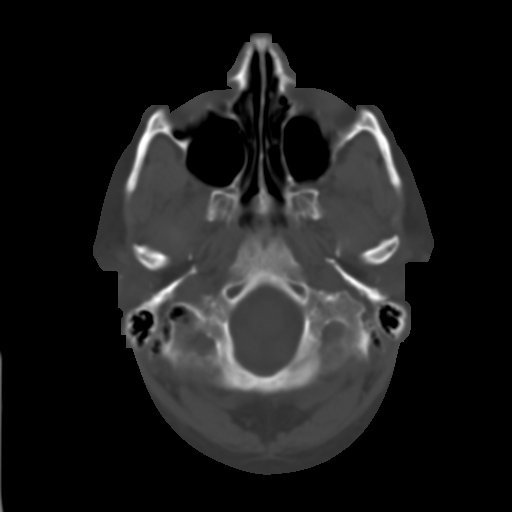
[im 4/28  brain]
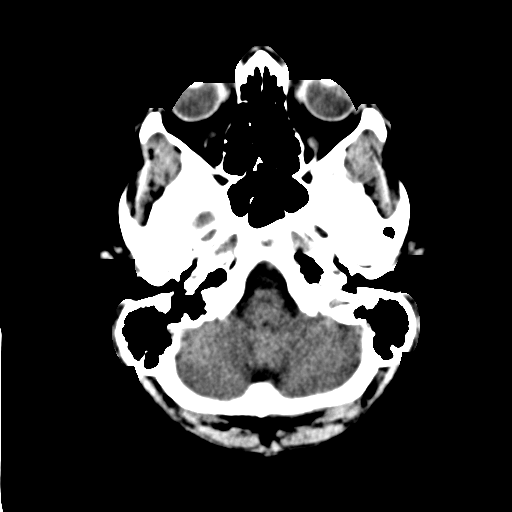
[im 6/28  brain]
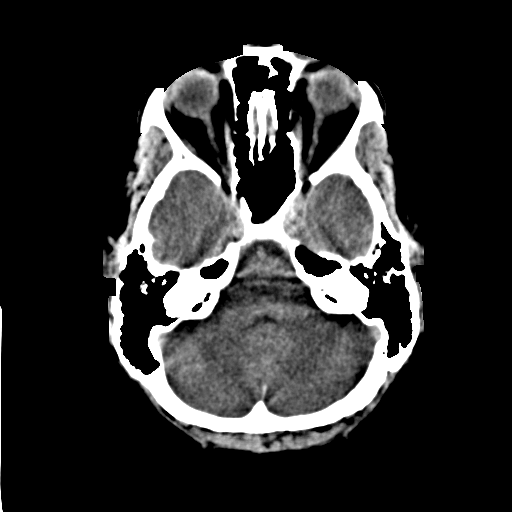
[im 7/28  brain]
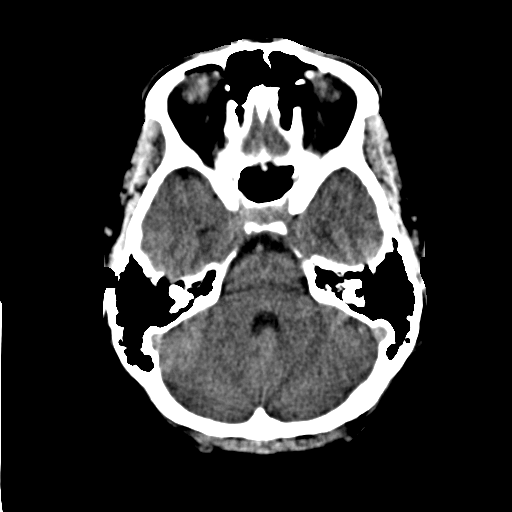
[im 9/28  brain]
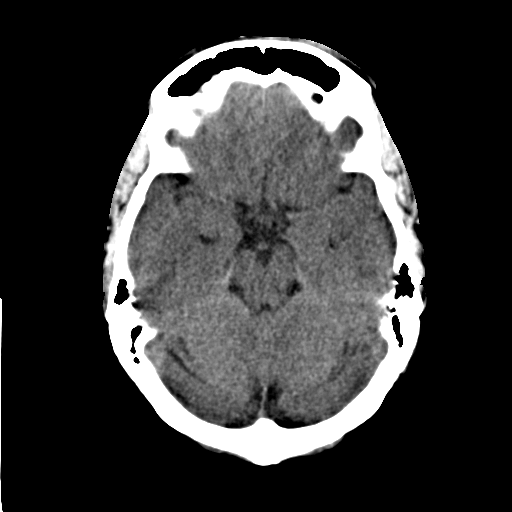
[im 9/28  bone]
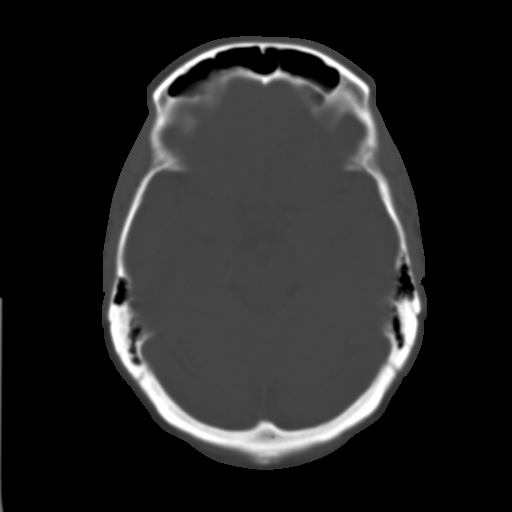
[im 10/28  brain]
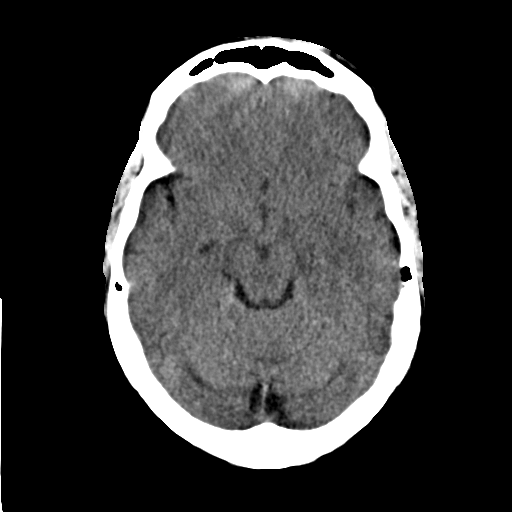
[im 12/28  brain]
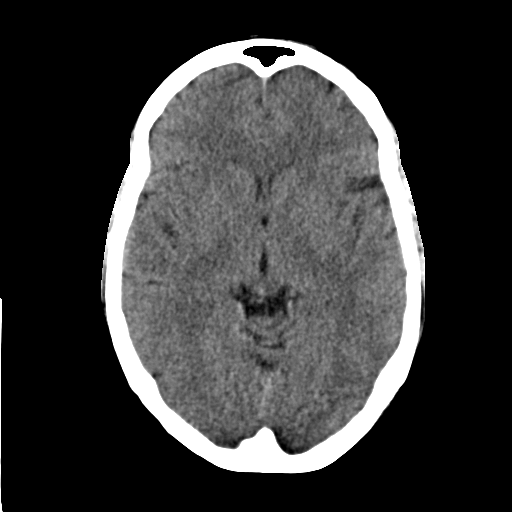
[im 14/28  brain]
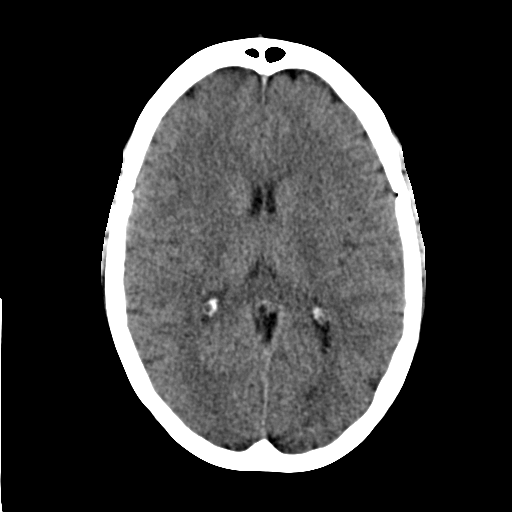
[im 15/28  brain]
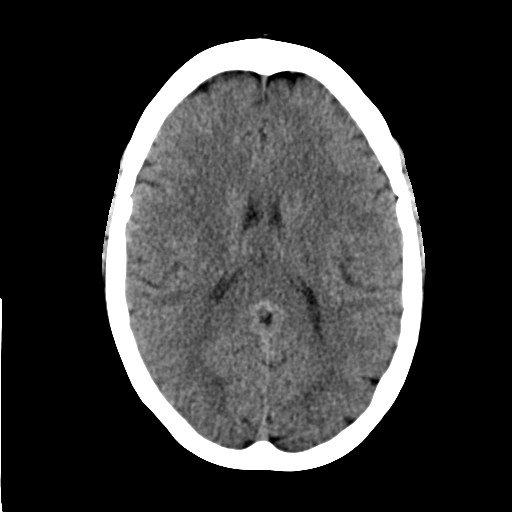
[im 15/28  bone]
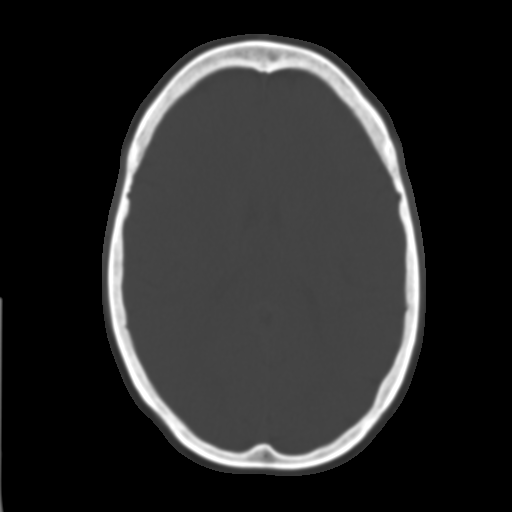
[im 17/28  brain]
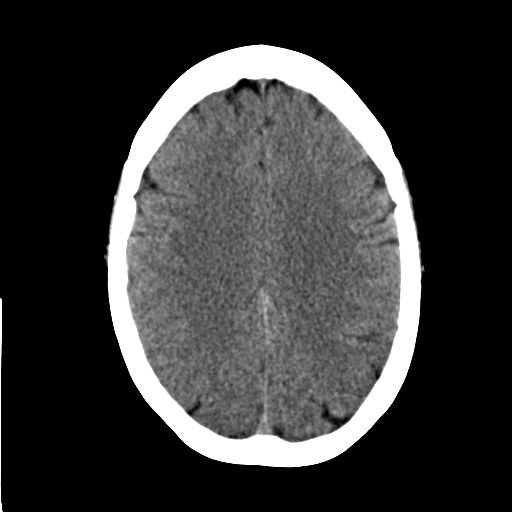
[im 19/28  brain]
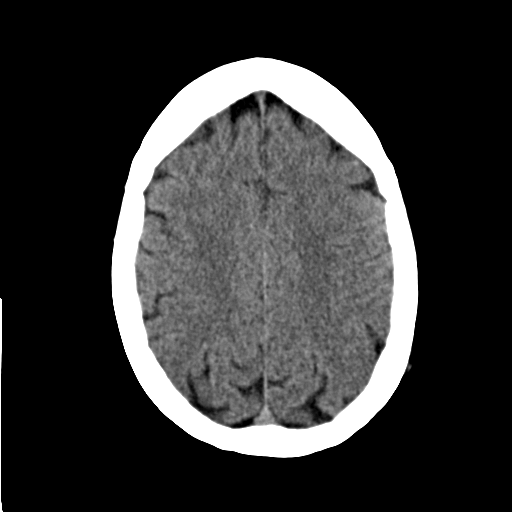
[im 20/28  brain]
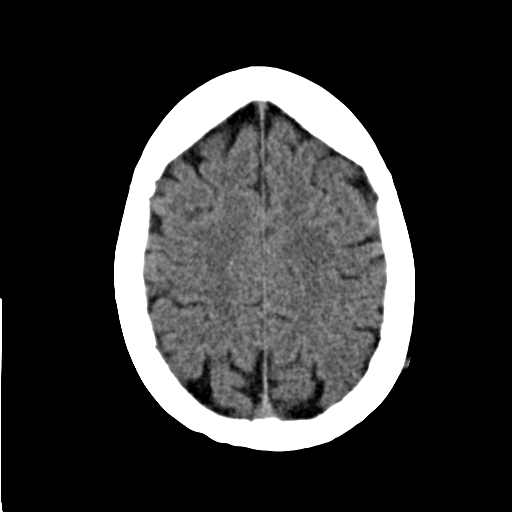
[im 22/28  brain]
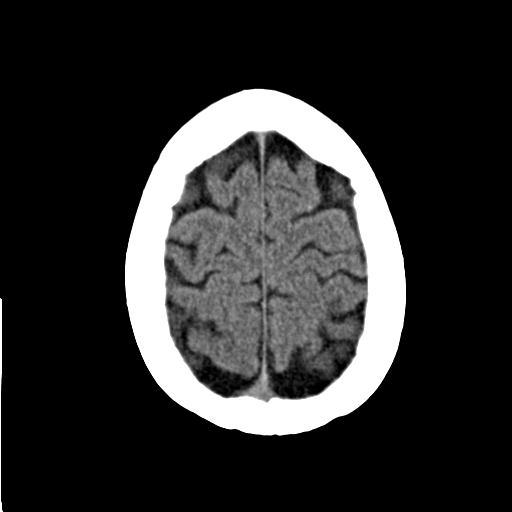
[im 22/28  bone]
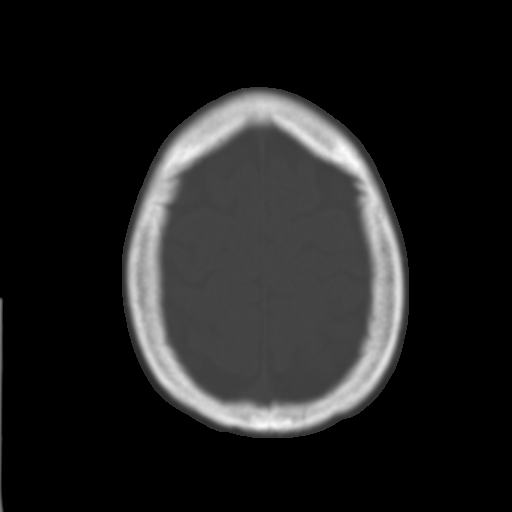
[im 23/28  brain]
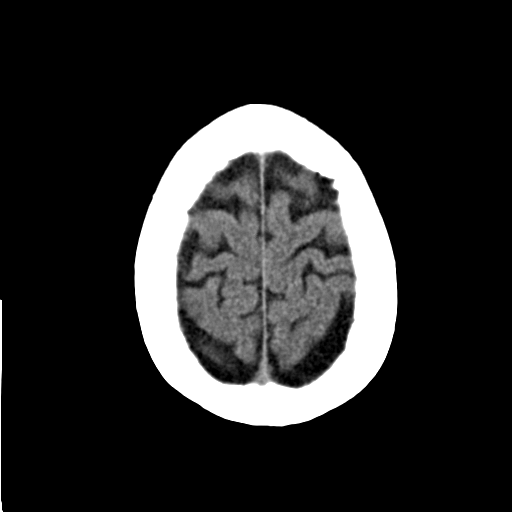
[im 25/28  brain]
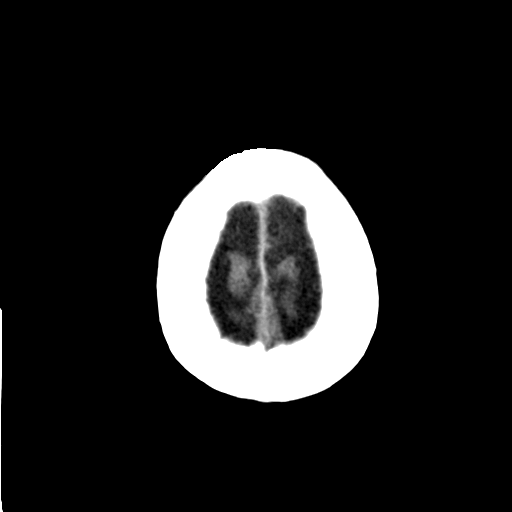
[im 27/28  brain]
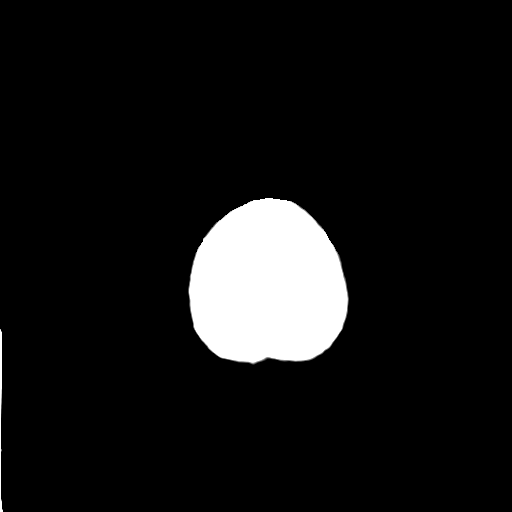

[16 of 28 positions shown; findings below may reference images not displayed]

PROCEDURE:     CT  - CT HEAD WITHOUT CONTRAST  - January 05, 2013  [DATE]

RESULT:     Axial noncontrast CT scanning was performed through the brain
with reconstructions at 5 mm intervals and slice thicknesses.

The ventricles are normal in size and position. There is no intracranial
hemorrhage nor intracranial mass effect. There is no evidence of an evolving
ischemic infarction. At bone window settings there is no evidence of an
acute skull fracture. The observed portions of the paranasal sinuses and
mastoid air cells are clear. There is soft tissue hematoma over the left
forehead.
IMPRESSION: Normal noncontrast CT scan of the brain.

[REDACTED]

## 2015-06-19 ENCOUNTER — Other Ambulatory Visit: Payer: Self-pay | Admitting: Family Medicine

## 2015-06-29 ENCOUNTER — Telehealth: Payer: Self-pay | Admitting: Family Medicine

## 2015-06-29 DIAGNOSIS — Z0000 Encounter for general adult medical examination without abnormal findings: Secondary | ICD-10-CM

## 2015-06-29 DIAGNOSIS — R739 Hyperglycemia, unspecified: Secondary | ICD-10-CM

## 2015-06-29 NOTE — Telephone Encounter (Signed)
-----   Message from Marchia Bond sent at 06/26/2015 10:05 AM EDT ----- Regarding: Cpx labs Wed 4/19, need orders. Thanks! :-) Please order  future cpx labs for pt's upcoming lab appt. Thanks Aniceto Boss

## 2015-07-02 ENCOUNTER — Other Ambulatory Visit (INDEPENDENT_AMBULATORY_CARE_PROVIDER_SITE_OTHER): Payer: Managed Care, Other (non HMO)

## 2015-07-02 DIAGNOSIS — R739 Hyperglycemia, unspecified: Secondary | ICD-10-CM | POA: Diagnosis not present

## 2015-07-02 DIAGNOSIS — Z0000 Encounter for general adult medical examination without abnormal findings: Secondary | ICD-10-CM | POA: Diagnosis not present

## 2015-07-02 LAB — COMPREHENSIVE METABOLIC PANEL
ALT: 11 U/L (ref 0–35)
AST: 13 U/L (ref 0–37)
Albumin: 4.2 g/dL (ref 3.5–5.2)
Alkaline Phosphatase: 80 U/L (ref 39–117)
BILIRUBIN TOTAL: 0.3 mg/dL (ref 0.2–1.2)
BUN: 16 mg/dL (ref 6–23)
CHLORIDE: 105 meq/L (ref 96–112)
CO2: 28 meq/L (ref 19–32)
CREATININE: 0.95 mg/dL (ref 0.40–1.20)
Calcium: 9.4 mg/dL (ref 8.4–10.5)
GFR: 65.56 mL/min (ref >60.00)
GLUCOSE: 113 mg/dL — AB (ref 70–99)
Potassium: 4.2 mEq/L (ref 3.5–5.1)
SODIUM: 141 meq/L (ref 135–145)
Total Protein: 7 g/dL (ref 6.0–8.3)

## 2015-07-02 LAB — CBC WITH DIFFERENTIAL/PLATELET
BASOS ABS: 0.1 10*3/uL (ref 0.0–0.1)
Basophils Relative: 0.8 % (ref 0.0–3.0)
EOS ABS: 0.1 10*3/uL (ref 0.0–0.7)
Eosinophils Relative: 1.9 % (ref 0.0–5.0)
HCT: 35.4 % — ABNORMAL LOW (ref 36.0–46.0)
Hemoglobin: 11.7 g/dL — ABNORMAL LOW (ref 12.0–15.0)
LYMPHS ABS: 1.8 10*3/uL (ref 0.7–4.0)
LYMPHS PCT: 22.8 % (ref 12.0–46.0)
MCHC: 33.1 g/dL (ref 30.0–36.0)
MCV: 77.5 fl — ABNORMAL LOW (ref 78.0–100.0)
MONO ABS: 0.4 10*3/uL (ref 0.1–1.0)
Monocytes Relative: 4.6 % (ref 3.0–12.0)
NEUTROS ABS: 5.5 10*3/uL (ref 1.4–7.7)
NEUTROS PCT: 69.9 % (ref 43.0–77.0)
PLATELETS: 332 10*3/uL (ref 150.0–400.0)
RBC: 4.57 Mil/uL (ref 3.87–5.11)
RDW: 16.5 % — ABNORMAL HIGH (ref 11.5–15.5)
WBC: 7.9 10*3/uL (ref 4.0–10.5)

## 2015-07-02 LAB — LIPID PANEL
CHOL/HDL RATIO: 3
Cholesterol: 201 mg/dL — ABNORMAL HIGH (ref 0–200)
HDL: 75.5 mg/dL (ref >39.00)
LDL CALC: 107 mg/dL — AB (ref 0–99)
NONHDL: 125.3
Triglycerides: 91 mg/dL (ref 0.0–149.0)
VLDL: 18.2 mg/dL (ref 0.0–40.0)

## 2015-07-02 LAB — HEMOGLOBIN A1C: HEMOGLOBIN A1C: 6.3 % (ref 4.6–6.5)

## 2015-07-02 LAB — TSH: TSH: 2.56 u[IU]/mL (ref 0.35–4.50)

## 2015-07-08 ENCOUNTER — Ambulatory Visit (INDEPENDENT_AMBULATORY_CARE_PROVIDER_SITE_OTHER): Payer: Managed Care, Other (non HMO) | Admitting: Family Medicine

## 2015-07-08 ENCOUNTER — Encounter: Payer: Self-pay | Admitting: Family Medicine

## 2015-07-08 VITALS — BP 112/78 | HR 69 | Temp 98.2°F | Ht 64.5 in | Wt 247.5 lb

## 2015-07-08 DIAGNOSIS — D126 Benign neoplasm of colon, unspecified: Secondary | ICD-10-CM

## 2015-07-08 DIAGNOSIS — Z111 Encounter for screening for respiratory tuberculosis: Secondary | ICD-10-CM

## 2015-07-08 DIAGNOSIS — R739 Hyperglycemia, unspecified: Secondary | ICD-10-CM

## 2015-07-08 DIAGNOSIS — Z0000 Encounter for general adult medical examination without abnormal findings: Secondary | ICD-10-CM | POA: Diagnosis not present

## 2015-07-08 DIAGNOSIS — Z1211 Encounter for screening for malignant neoplasm of colon: Secondary | ICD-10-CM

## 2015-07-08 NOTE — Assessment & Plan Note (Signed)
Colonoscopy is due in dec She will call to schedule

## 2015-07-08 NOTE — Assessment & Plan Note (Signed)
Reviewed health habits including diet and exercise and skin cancer prevention Reviewed appropriate screening tests for age  Also reviewed health mt list, fam hx and immunization status , as well as social and family history   See HPI Labs reviewed Try to get 1200-1500 mg of calcium per day with at least 1000 iu of vitamin D - for bone health  Follow up with your gyn for annual exam and get your mammogram  You can also get a multi vitamin with iron and take one daily - you are slightly anemic Colonoscopy is due in December for 10 year recall - you can call us then to schedule if needed  PPD (TB test today)- come back to have it read on Thursday Start walking with a friend- aim for 5 days per week if you can

## 2015-07-08 NOTE — Patient Instructions (Signed)
Try to get 1200-1500 mg of calcium per day with at least 1000 iu of vitamin D - for bone health  Follow up with your gyn for annual exam and get your mammogram  You can also get a multi vitamin with iron and take one daily - you are slightly anemic Colonoscopy is due in December for 10 year recall - you can call us then to schedule if needed  PPD (TB test today)- come back to have it read on Thursday Start walking with a friend- aim for 5 days per week if you can

## 2015-07-08 NOTE — Progress Notes (Signed)
Subjective:    Patient ID: Meagan Conway, female    DOB: 1960-11-02, 55 y.o.   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  No periods  Has had the novosure in the past  Few hot flashes now and then   Td 10/16  Hyperglycemia Lab Results  Component Value Date   HGBA1C 6.3 07/02/2015   This is up from 6.1 No DM in family    Hx of anemia Lab Results  Component Value Date   WBC 7.9 07/02/2015   HGB 11.7* 07/02/2015   HCT 35.4* 07/02/2015   MCV 77.5* 07/02/2015   PLT 332.0 07/02/2015  has had anemia on and off for a long time Does not take iron    Cholesterol Lab Results  Component Value Date   CHOL 201* 07/02/2015   CHOL 164 07/01/2014   CHOL 150 06/27/2013   Lab Results  Component Value Date   HDL 75.50 07/02/2015   HDL 64.10 07/01/2014   HDL 52.60 06/27/2013   Lab Results  Component Value Date   LDLCALC 107* 07/02/2015   Jordani Nunn 85 07/01/2014   LDLCALC 77 06/27/2013   Lab Results  Component Value Date   TRIG 91.0 07/02/2015   TRIG 77.0 07/01/2014   TRIG 101.0 06/27/2013   Lab Results  Component Value Date   CHOLHDL 3 07/02/2015   CHOLHDL 3 07/01/2014   CHOLHDL 3 06/27/2013   No results found for: LDLDIRECT  Not bad- HDL is up / LDL is up very slightly  Eats red  meat occasionally   Patient Active Problem List   Diagnosis Date Noted  . Anemia 12/29/2014  . Colon cancer screening 07/08/2014  . Hyperglycemia 07/04/2013  . Routine general medical examination at a health care facility 06/04/2011  . GERD (gastroesophageal reflux disease) 03/10/2011  . Morbid obesity ([place]) 03/10/2011  . COLONIC POLYPS 04/22/2010  . ANXIETY DEPRESSION 02/28/2008   Past Medical History  Diagnosis Date  . Hypertension 05/2005  . Depression     with anxious features  . Obesity    Past Surgical History  Procedure Laterality Date  . Vesico-vaginal fistula repair  1994  . Ovarian cyst surgery  08/2002  . Endometrial ablation w/ novasure     Social History  Substance Use Topics  . Smoking status: Former Research scientist (life sciences)  . Smokeless tobacco: None     Comment: quit many years ago.  . Alcohol Use: 0.0 oz/week    0 Standard drinks or equivalent per week     Comment: 3-4 times per week   Family History  Problem Relation Age of Onset  . Hypertension  Mother   . Hypertension Father   . Heart disease Father 3    MI  . Alcohol abuse Father   . Heart disease Maternal Grandmother   . Heart disease Paternal Grandmother    No Known Allergies Current Outpatient Prescriptions on File Prior to Visit  Medication Sig Dispense Refill  . FLUoxetine (PROZAC) 20 MG capsule TAKE 1 CAPSULE DAILY 90 capsule 0  . pantoprazole (PROTONIX) 40 MG tablet TAKE 1 TABLET DAILY 90 tablet 0   No current facility-administered medications on file prior to visit.     Review of Systems Review of Systems  Constitutional: Negative for fever, appetite change, fatigue and unexpected weight change.  Eyes: Negative for pain and visual disturbance.  Respiratory: Negative for cough and shortness of breath.   Cardiovascular: Negative for cp or palpitations    Gastrointestinal: Negative for nausea, diarrhea and constipation.  Genitourinary: Negative for urgency and frequency. neg for polydipsia or  polyuria  Skin: Negative for pallor or rash   Neurological: Negative for weakness, light-headedness, numbness and headaches.  Hematological: Negative for adenopathy. Does not bruise/bleed easily.  Psychiatric/Behavioral: Negative for dysphoric mood. The patient is not nervous/anxious.         Objective:   Physical Exam  Constitutional: She appears well-developed and well-nourished. No distress.  Morbidly obese and well appearing  HENT:  Head: Normocephalic and atraumatic.  Right Ear: External ear normal.  Left Ear: External ear normal.  Mouth/Throat: Oropharynx is clear and moist.  Eyes: Conjunctivae and EOM are normal. Pupils are equal, round, and reactive to light. No scleral icterus.  Neck: Normal range of motion. Neck supple. No JVD present. Carotid bruit is not present. No thyromegaly present.  Cardiovascular: Normal rate, regular rhythm, normal heart sounds and intact distal pulses.  Exam reveals no gallop.   Pulmonary/Chest: Effort normal and breath sounds normal. No respiratory distress. She has no wheezes. She exhibits no tenderness.  Abdominal: Soft. Bowel sounds are normal. She exhibits no distension, no abdominal bruit and no mass. There is no tenderness.  Genitourinary: No breast swelling, tenderness, discharge or bleeding.  Breast exam: No mass, nodules, thickening, tenderness, bulging, retraction, inflamation, nipple discharge or skin changes noted.  No axillary or clavicular LA.      Musculoskeletal: Normal range of motion. She exhibits no edema or tenderness.  Lymphadenopathy:    She has no cervical adenopathy.  Neurological: She is alert. She has normal reflexes. No cranial nerve deficit. She exhibits normal muscle tone. Coordination normal.  Skin: Skin is warm and dry. No rash noted. No erythema. No pallor.  Psychiatric: She has a normal mood and affect.          Assessment & Plan:   Problem List Items Addressed This Visit      Digestive   COLONIC POLYPS     Colonoscopy is due in dec She will call to schedule        Other   Colon cancer screening    Due for colonoscopy in Dec She is aware and will call to schedule        Hyperglycemia    Lab Results  Component Value Date   HGBA1C 6.3 07/02/2015   Disc low glycemic diet/exercise and wt loss to prevent DM2      Morbid obesity ([place])    Discussed how this problem influences overall health and the risks it imposes  Reviewed plan for weight loss with lower calorie diet (via better food choices and also portion control  or program like weight watchers) and exercise building up to or more than 30 minutes 5 days per week including some aerobic activity   Unsure if pt is motivated for change       Routine general medical examination at a health care facility - Primary    Reviewed health habits including diet and exercise and skin cancer prevention Reviewed appropriate screening tests for age  Also reviewed health mt list, fam hx and immunization status , as well as social and family history   See HPI Labs reviewed Try to get 1200-1500 mg of calcium per day with at least 1000 iu of vitamin D - for bone health  Follow up with your gyn for annual exam and get your mammogram  You can also get a multi vitamin with iron and take one daily - you are slightly anemic Colonoscopy is due in December for 10 year recall - you can call us then to schedule if needed  PPD (TB test today)- come back to have it read on Thursday Start walking with a friend- aim for 5 days per week if you can        Other Visit Diagnoses    Visit for TB skin test        Relevant Orders    TB Skin Test (Completed)

## 2015-07-08 NOTE — Progress Notes (Signed)
Pre visit review using our clinic review tool, if applicable. No additional management support is needed unless otherwise documented below in the visit note. 

## 2015-07-08 NOTE — Assessment & Plan Note (Signed)
Lab Results  Component Value Date   HGBA1C 6.3 07/02/2015   Disc low glycemic diet/exercise and wt loss to prevent DM2

## 2015-07-08 NOTE — Assessment & Plan Note (Signed)
Discussed how this problem influences overall health and the risks it imposes  Reviewed plan for weight loss with lower calorie diet (via better food choices and also portion control or program like weight watchers) and exercise building up to or more than 30 minutes 5 days per week including some aerobic activity   Unsure if pt is motivated for change 

## 2015-07-08 NOTE — Assessment & Plan Note (Signed)
Due for colonoscopy in Dec She is aware and will call to schedule

## 2015-07-11 LAB — TB SKIN TEST
Induration: 0 mm
TB Skin Test: NEGATIVE

## 2015-07-14 ENCOUNTER — Encounter: Payer: Self-pay | Admitting: Obstetrics & Gynecology

## 2015-07-14 ENCOUNTER — Ambulatory Visit (INDEPENDENT_AMBULATORY_CARE_PROVIDER_SITE_OTHER): Payer: Managed Care, Other (non HMO) | Admitting: Obstetrics & Gynecology

## 2015-07-14 VITALS — BP 132/86 | HR 76 | Resp 16 | Ht 64.5 in | Wt 247.0 lb

## 2015-07-14 DIAGNOSIS — Z1151 Encounter for screening for human papillomavirus (HPV): Secondary | ICD-10-CM

## 2015-07-14 DIAGNOSIS — Z01419 Encounter for gynecological examination (general) (routine) without abnormal findings: Secondary | ICD-10-CM

## 2015-07-14 DIAGNOSIS — Z124 Encounter for screening for malignant neoplasm of cervix: Secondary | ICD-10-CM

## 2015-07-14 NOTE — Progress Notes (Signed)
Subjective:    Meagan Conway is a 55 y.o. MW P2 (5 and 53 yo kids and 3 grands) female who presents for an annual exam. The patient has no complaints today. The patient is sexually active. GYN screening history: last pap: was normal. The patient wears seatbelts: yes. The patient participates in regular exercise: no. Has the patient ever been transfused or tattooed?: yes.(eye liner)  The patient reports that there is not domestic violence in her life.   Menstrual History: OB History    Gravida Para Term Preterm AB TAB SAB Ectopic Multiple Living   2 2 2       2       Menarche age: 57  No LMP recorded. Patient has had an ablation.    The following portions of the patient's history were reviewed and updated as appropriate: allergies, current medications, past family history, past medical history, past social history, past surgical history and problem list.  Review of Systems Pertinent items are noted in HPI. Had her flu vaccine. Works at [employer]. Married for 14 years, uses [employer] for sex, denies dyspareunia. Due for mammogram. Colonoscopy planned for 12/17. Gets her fasting labs with Dr. Glori Bickers. She has a RV fistula for 19 years, not interested in repair at this time.   Objective:    BP 132/86 mmHg  Pulse 76  Resp 16  Ht 5' 4.5" (1.638 m)  Wt 247 lb (112.038 kg)  BMI 41.76 kg/m2  General Appearance:    Alert, cooperative, no distress, appears stated age  Head:    Normocephalic, without obvious abnormality, atraumatic  Eyes:    PERRL, conjunctiva/corneas clear, EOM's intact, fundi    benign, both eyes  Ears:    Normal TM's and external ear canals, both ears  Nose:   Nares normal, septum midline, mucosa normal, no drainage    or sinus tenderness  Throat:   Lips, mucosa, and tongue normal; teeth and gums normal  Neck:   Supple, symmetrical, trachea midline, no adenopathy;    thyroid:  no enlargement/tenderness/nodules; no carotid   bruit or JVD  Back:     Symmetric, no  curvature, ROM normal, no CVA tenderness  Lungs:     Clear to auscultation bilaterally, respirations unlabored  Chest Wall:    No tenderness or deformity   Heart:    Regular rate and rhythm, S1 and S2 normal, no murmur, rub   or gallop  Breast Exam:    No tenderness, masses, or nipple abnormality  Abdomen:     Soft, non-tender, bowel sounds active all four quadrants,    no masses, no organomegaly  Genitalia:    Normal female without lesion, discharge or tenderness, small amount of discharge c/w BV, NSSA, NT, mobile, no palpable adnexal masses     Extremities:   Extremities normal, atraumatic, no cyanosis or edema  Pulses:   2+ and symmetric all extremities  Skin:   Skin color, texture, turgor normal, no rashes or lesions  Lymph nodes:   Cervical, supraclavicular, and axillary nodes normal  Neurologic:   CNII-XII intact, normal strength, sensation and reflexes    throughout  .    Assessment:    Healthy female exam.    Plan:     Mammogram. Thin prep Pap smear. with cotesting (aware of ACOG recs) She will use flagyl if she becomes symptomatic

## 2015-07-15 LAB — CYTOLOGY - PAP

## 2015-07-28 LAB — HM MAMMOGRAPHY: HM MAMMO: NORMAL (ref 0–4)

## 2015-09-17 ENCOUNTER — Other Ambulatory Visit: Payer: Self-pay | Admitting: Family Medicine

## 2016-01-26 ENCOUNTER — Ambulatory Visit (INDEPENDENT_AMBULATORY_CARE_PROVIDER_SITE_OTHER): Payer: Managed Care, Other (non HMO) | Admitting: Podiatry

## 2016-01-26 ENCOUNTER — Ambulatory Visit (INDEPENDENT_AMBULATORY_CARE_PROVIDER_SITE_OTHER): Payer: Managed Care, Other (non HMO)

## 2016-01-26 VITALS — BP 134/90 | HR 62 | Temp 98.5°F | Resp 16 | Ht 65.0 in | Wt 250.0 lb

## 2016-01-26 DIAGNOSIS — M722 Plantar fascial fibromatosis: Secondary | ICD-10-CM | POA: Diagnosis not present

## 2016-01-26 MED ORDER — METHYLPREDNISOLONE 4 MG PO TBPK: ORAL_TABLET | ORAL | 0 refills | Status: DC

## 2016-01-26 MED ORDER — MELOXICAM 15 MG PO TABS: 15.0000 mg | ORAL_TABLET | Freq: Every day | ORAL | 3 refills | Status: DC

## 2016-01-26 NOTE — Patient Instructions (Signed)

## 2016-01-26 NOTE — Progress Notes (Signed)
   Subjective:    Patient ID: Meagan Conway, female    DOB: March 08, 1961, 55 y.o.   MRN: [id number]  HPI she presents today with chief complaint of left heel pain 3 months. She states that seems to be intermittent as a dull achy pain she's tried ibuprofen without any relief. Denies any initiating trauma.    Review of Systems  All other systems reviewed and are negative.      Objective:   Physical Exam: Vital signs are stable she is alert and oriented 3. Pulses are strongly palpable. Neurologic sensorium is intact. Deep tendon reflexes are intact. Muscle strength is normal bilaterally. Orthopedic evaluation demonstrates pain on palpation medial calcaneal tubercle of the left heel. Radiographs taken today do demonstrate a soft tissue increase in density of the plantar fascia calcaneal insertion site of the left heel no fractures are identified. Due to his evaluations are supple well-hydrated cutis no erythema edema cellulitis drainage or odor.        Assessment & Plan:  Plantar fasciitis of her left heel.  Plan: Discussed etiology pathology conservative versus surgical therapies. Start her on a Medrol Dosepak to be followed by meloxicam. Injected Kenalog and local anesthetic to the point of maximal tenderness of the left heel. Posterior plantar fascia brace and a plantar fascia night splint. Discussed appropriate shoe gear stretching exercises ice therapy and she can modifications. I will follow-up with her in 1 month.

## 2016-03-03 ENCOUNTER — Ambulatory Visit: Payer: Managed Care, Other (non HMO) | Admitting: Podiatry

## 2016-03-24 ENCOUNTER — Ambulatory Visit: Payer: Managed Care, Other (non HMO) | Admitting: Podiatry

## 2016-03-31 ENCOUNTER — Ambulatory Visit: Payer: Managed Care, Other (non HMO) | Admitting: Podiatry

## 2016-06-09 ENCOUNTER — Emergency Department: Payer: Managed Care, Other (non HMO)

## 2016-06-09 ENCOUNTER — Emergency Department
Admission: EM | Admit: 2016-06-09 | Discharge: 2016-06-09 | Disposition: A | Discharge status: Home / Self Care | Payer: Managed Care, Other (non HMO) | Attending: Emergency Medicine | Admitting: Emergency Medicine

## 2016-06-09 DIAGNOSIS — H8110 Benign paroxysmal vertigo, unspecified ear: Secondary | ICD-10-CM | POA: Diagnosis not present

## 2016-06-09 DIAGNOSIS — Z791 Long term (current) use of non-steroidal anti-inflammatories (NSAID): Secondary | ICD-10-CM | POA: Diagnosis not present

## 2016-06-09 DIAGNOSIS — Z87891 Personal history of nicotine dependence: Secondary | ICD-10-CM | POA: Insufficient documentation

## 2016-06-09 DIAGNOSIS — I10 Essential (primary) hypertension: Secondary | ICD-10-CM | POA: Insufficient documentation

## 2016-06-09 LAB — COMPREHENSIVE METABOLIC PANEL
ALT: 16 U/L (ref 14–54)
ANION GAP: 10 (ref 5–15)
AST: 24 U/L (ref 15–41)
Albumin: 4 g/dL (ref 3.5–5.0)
Alkaline Phosphatase: 83 U/L (ref 38–126)
BUN: 15 mg/dL (ref 6–20)
CHLORIDE: 107 mmol/L (ref 101–111)
CO2: 23 mmol/L (ref 22–32)
Calcium: 9 mg/dL (ref 8.9–10.3)
Creatinine, Ser: 0.81 mg/dL (ref 0.44–1.00)
GFR calc non Af Amer: >60 mL/min (ref >60)
Glucose, Bld: 166 mg/dL — ABNORMAL HIGH (ref 65–99)
Potassium: 3.6 mmol/L (ref 3.5–5.1)
SODIUM: 140 mmol/L (ref 135–145)
Total Bilirubin: 0.4 mg/dL (ref 0.3–1.2)
Total Protein: 7 g/dL (ref 6.5–8.1)

## 2016-06-09 LAB — CBC
HCT: 33.7 % — ABNORMAL LOW (ref 35.0–47.0)
Hemoglobin: 11 g/dL — ABNORMAL LOW (ref 12.0–16.0)
MCH: 24.4 pg — ABNORMAL LOW (ref 26.0–34.0)
MCHC: 32.6 g/dL (ref 32.0–36.0)
MCV: 74.8 fL — ABNORMAL LOW (ref 80.0–100.0)
PLATELETS: 337 10*3/uL (ref 150–440)
RBC: 4.51 MIL/uL (ref 3.80–5.20)
RDW: 16.8 % — AB (ref 11.5–14.5)
WBC: 9.9 10*3/uL (ref 3.6–11.0)

## 2016-06-09 MED ORDER — LORAZEPAM 1 MG PO TABS
1.0000 mg | ORAL_TABLET | Freq: Once | ORAL | Status: AC
  Administered 2016-06-09: 1 mg via ORAL

## 2016-06-09 MED ORDER — ONDANSETRON HCL 4 MG/2ML IJ SOLN
4.0000 mg | Freq: Once | INTRAMUSCULAR | Status: AC
  Administered 2016-06-09: 4 mg via INTRAVENOUS

## 2016-06-09 MED ORDER — MECLIZINE HCL 32 MG PO TABS: 32.0000 mg | ORAL_TABLET | Freq: Three times a day (TID) | ORAL | 0 refills | Status: DC | PRN

## 2016-06-09 MED ORDER — SCOPOLAMINE 1 MG/3DAYS TD PT72
1.0000 | MEDICATED_PATCH | TRANSDERMAL | Status: DC
  Administered 2016-06-09: 1.5 mg via TRANSDERMAL
  Filled 2016-06-09: qty 1

## 2016-06-09 MED ORDER — MECLIZINE HCL 25 MG PO TABS
25.0000 mg | ORAL_TABLET | Freq: Once | ORAL | Status: AC
  Administered 2016-06-09: 25 mg via ORAL

## 2016-06-09 MED ORDER — ONDANSETRON 4 MG PO TBDP
8.0000 mg | ORAL_TABLET | Freq: Once | ORAL | Status: AC
  Administered 2016-06-09: 8 mg via ORAL
  Filled 2016-06-09: qty 2

## 2016-06-09 MED ORDER — SODIUM CHLORIDE 0.9 % IV BOLUS (SEPSIS)
1000.0000 mL | Freq: Once | INTRAVENOUS | Status: AC
  Administered 2016-06-09: 1000 mL via INTRAVENOUS

## 2016-06-09 MED ORDER — MECLIZINE HCL 25 MG PO TABS
25.0000 mg | ORAL_TABLET | Freq: Once | ORAL | Status: AC
  Administered 2016-06-09: 25 mg via ORAL
  Filled 2016-06-09: qty 1

## 2016-06-09 NOTE — ED Notes (Signed)
Pt still very dizzy. Discussed with Dr Joni Fears, who ordered meclizine and ativan. Pt needs to use toilet but is unable to walk over. Will continue to monitor as we wait for medication to take effect.

## 2016-06-09 NOTE — ED Provider Notes (Signed)
[facility] Emergency Department Provider Note   First MD Initiated Contact with Patient 06/09/16 [phone]     (approximate)  I have reviewed the triage vital signs and the nursing notes.   HISTORY  Chief Complaint Emesis and Dizziness    HPI FAMA MUENCHOW is a 56 y.o. female with bolus of chronic medical conditions presents to the emergency department via EMS with acute onset of awakening this morning of dizziness and vomiting. Patient admits to a history of vertigo in the past however states that "this is worse than last episode. Patient denies any headache no weakness numbness. Patient denies any chest pain or shortness of breath. Patient denies any palpitations   Past Medical History:  Diagnosis Date  . Depression    with anxious features  . Hypertension 05/2005  . Obesity     Patient Active Problem List   Diagnosis Date Noted  . Anemia 12/29/2014  . Colon cancer screening 07/08/2014  . Hyperglycemia 07/04/2013  . Routine general medical examination at a health care facility 06/04/2011  . GERD (gastroesophageal reflux disease) 03/10/2011  . Morbid obesity ([place]) 03/10/2011  . COLONIC POLYPS 04/22/2010  . ANXIETY DEPRESSION 02/28/2008    Past Surgical History:  Procedure Laterality Date  . ENDOMETRIAL ABLATION W/ NOVASURE    . OVARIAN CYST SURGERY  08/2002  . [place]    Prior to Admission medications   Medication Sig Start Date End Date Taking? Authorizing Provider  FLUoxetine (PROZAC) 20 MG capsule TAKE 1 CAPSULE DAILY 09/17/15   Abner Greenspan, MD  fluticasone [facility]) 50 MCG/ACT nasal spray Place into the nose. 09/04/14   Historical Provider, MD  meloxicam (MOBIC) 15 MG tablet Take 1 tablet (15 mg total) by mouth daily. 01/26/16   Max T Hyatt, DPM  methylPREDNISolone (MEDROL DOSEPAK) 4 MG TBPK tablet 6 day dose pack - take as directed 01/26/16   Max T Hyatt, DPM  pantoprazole (PROTONIX) 40 MG tablet TAKE 1  TABLET DAILY 09/17/15   Abner Greenspan, MD    Allergies Patient has no known allergies.  Family History  Problem Relation Age of Onset  . Hypertension Mother   . Hypertension Father   . Heart disease Father 53    MI  . Alcohol abuse Father   . Heart disease Maternal Grandmother   . Heart disease Paternal Grandmother     Social History Social History  Substance Use Topics  . Smoking status: Former Research scientist (life sciences)  . Smokeless tobacco: Never Used     Comment: quit many years ago.  . Alcohol use 0.0 oz/week     Comment: 3-4 times per week    Review of Systems Constitutional: No fever/chills Eyes: No visual changes. ENT: No sore throat. Cardiovascular: Denies chest pain. Respiratory: Denies shortness of breath. Gastrointestinal: No abdominal pain.  No nausea, Positive for vomiting.  No diarrhea.  No constipation. Genitourinary: Negative for dysuria. Musculoskeletal: Negative for back pain. Skin: Negative for rash. Neurological: Negative for headaches, focal weakness or numbness.Positive for dizziness  10-point ROS otherwise negative.  ____________________________________________   PHYSICAL EXAM:  VITAL SIGNS: ED Triage Vitals  Enc Vitals Group     BP 06/09/16 0442 (!) 183/104     Pulse Rate 06/09/16 0442 64     Resp 06/09/16 0442 16     Temp 06/09/16 0442 97.3 F (36.3 C)     Temp Source 06/09/16 0442 Oral     SpO2 06/09/16 0442 95 %  Weight 06/09/16 0444 250 lb (113.4 kg)     Height 06/09/16 0444 5\' 5"  (1.651 m)     Head Circumference --      Peak Flow --      Pain Score 06/09/16 0442 0     Pain Loc --      Pain Edu? --      Excl. in [place]? --     Constitutional: Alert and oriented. Well appearing and in no acute distress. Eyes: Conjunctivae are normal. PERRL. EOMI.Positive horizontal nystagmus Head: Atraumatic. Mouth/Throat: Mucous membranes are moist. Oropharynx non-erythematous. Neck: No stridor.  No meningeal signs.   Cardiovascular: Normal rate, regular  rhythm. Good peripheral circulation. Grossly normal heart sounds. Respiratory: Normal respiratory effort.  No retractions. Lungs CTAB. Gastrointestinal: Soft and nontender. No distention.  Musculoskeletal: No lower extremity tenderness nor edema. No gross deformities of extremities. Neurologic:  Normal speech and language. No gross focal neurologic deficits are appreciated.  Skin:  Skin is warm, dry and intact. No rash noted. Psychiatric: Mood and affect are normal. Speech and behavior are normal.  ____________________________________________   LABS (all labs ordered are listed, but only abnormal results are displayed)  Labs Reviewed  CBC - Abnormal; Notable for the following:       Result Value   Hemoglobin 11.0 (*)    HCT 33.7 (*)    MCV 74.8 (*)    MCH 24.4 (*)    RDW 16.8 (*)    All other components within normal limits  COMPREHENSIVE METABOLIC PANEL - Abnormal; Notable for the following:    Glucose, Bld 166 (*)    All other components within normal limits     RADIOLOGY I, Fairlea N Gavriella Hearst, personally viewed and evaluated these images (plain radiographs) as part of my medical decision making, as well as reviewing the written report by the radiologist.  Mr Angiogram Head Wo Contrast  Result Date: 06/09/2016 CLINICAL DATA:  56 y/o  F; dizziness and nystagmus. EXAM: MRI HEAD WITHOUT CONTRAST MRA HEAD WITHOUT CONTRAST TECHNIQUE: Multiplanar, multiecho pulse sequences of the brain and surrounding structures were obtained without intravenous contrast. Angiographic images of the head were obtained using MRA technique without contrast. COMPARISON:  01/05/2013 CT head.  09/04/2012 MRI head. FINDINGS: MRI HEAD FINDINGS Brain: No acute infarction, hemorrhage, hydrocephalus, extra-axial collection or mass lesion. No significant T2 FLAIR signal abnormality. Vascular: As below. Skull and upper cervical spine: Normal marrow signal. Sinuses/Orbits: Negative. Other: None. MRA HEAD FINDINGS  Internal carotid arteries:  Patent. Anterior cerebral arteries:  Patent. Middle cerebral arteries: Patent. Anterior communicating artery: Patent. Posterior communicating arteries: Small right and probable diminutive left are patent. Posterior cerebral arteries:  Patent. Basilar artery:  Patent. Vertebral arteries:  Patent. No significant stenosis, large vessel occlusion, or aneurysm identified. IMPRESSION: 1. No acute intracranial abnormality identified. Stable unremarkable MRI of the brain. 2. Unremarkable MRA of the head without significant stenosis, large vessel occlusion, or aneurysm. Electronically Signed   By: Kristine Garbe M.D.   On: 06/09/2016 06:18   Mr Brain Wo Contrast  Result Date: 06/09/2016 CLINICAL DATA:  56 y/o  F; dizziness and nystagmus. EXAM: MRI HEAD WITHOUT CONTRAST MRA HEAD WITHOUT CONTRAST TECHNIQUE: Multiplanar, multiecho pulse sequences of the brain and surrounding structures were obtained without intravenous contrast. Angiographic images of the head were obtained using MRA technique without contrast. COMPARISON:  01/05/2013 CT head.  09/04/2012 MRI head. FINDINGS: MRI HEAD FINDINGS Brain: No acute infarction, hemorrhage, hydrocephalus, extra-axial collection or mass lesion. No significant T2  FLAIR signal abnormality. Vascular: As below. Skull and upper cervical spine: Normal marrow signal. Sinuses/Orbits: Negative. Other: None. MRA HEAD FINDINGS Internal carotid arteries:  Patent. Anterior cerebral arteries:  Patent. Middle cerebral arteries: Patent. Anterior communicating artery: Patent. Posterior communicating arteries: Small right and probable diminutive left are patent. Posterior cerebral arteries:  Patent. Basilar artery:  Patent. Vertebral arteries:  Patent. No significant stenosis, large vessel occlusion, or aneurysm identified. IMPRESSION: 1. No acute intracranial abnormality identified. Stable unremarkable MRI of the brain. 2. Unremarkable MRA of the head without  significant stenosis, large vessel occlusion, or aneurysm. Electronically Signed   By: Kristine Garbe M.D.   On: 06/09/2016 06:18      Procedures   ____________________________________________   INITIAL IMPRESSION / ASSESSMENT AND PLAN / ED COURSE  Pertinent labs & imaging results that were available during my care of the patient were reviewed by me and considered in my medical decision making (see chart for details).  Patient given meclizine 25 mg and 1 L IV normal saline as well as Zofran for nausea. History of physical exam concerning for vertigo. MRI performed the brain to evaluate for central etiologies of vertigo. MRI revealed no acute abnormality.      ____________________________________________  FINAL CLINICAL IMPRESSION(S) / ED DIAGNOSES  Final diagnoses:  Benign paroxysmal positional vertigo, unspecified laterality     MEDICATIONS GIVEN DURING THIS VISIT:  Medications  ondansetron (ZOFRAN) injection 4 mg (4 mg Intravenous Given 06/09/16 0449)  meclizine (ANTIVERT) tablet 25 mg (25 mg Oral Given 06/09/16 0606)  sodium chloride 0.9 % bolus 1,000 mL (1,000 mLs Intravenous New Bag/Given 06/09/16 0449)     NEW OUTPATIENT MEDICATIONS STARTED DURING THIS VISIT:  New Prescriptions   No medications on file    Modified Medications   No medications on file    Discontinued Medications   No medications on file     Note:  This document was prepared using Dragon voice recognition software and may include unintentional dictation errors.    Gregor Hams, MD 06/09/16 [phone]

## 2016-06-09 NOTE — ED Notes (Signed)
Resumed care from [place], [place]. Pt sleeping with husband at bedside.

## 2016-06-09 NOTE — ED Triage Notes (Signed)
Pt arrives to ED from home via [facility] with c/o waking up tonight with dizziness, diaphoresis, and N/V. EMS reports pt with h/x of vertigo, recently started "diet suppliments from [facility]". Dr Owens Shark at bedside upon pt's arrival to ED, reports (+) nystagmus.

## 2016-06-09 NOTE — ED Notes (Signed)
Pt able to move to toilet with assistance. She began to heave a bit when she sat up initially, but that lasted only a minute or so. Pt assisted to wheelchair when toileting complete.

## 2016-06-09 NOTE — ED Provider Notes (Signed)
Pt was sleeping comfortably. When awakened she has some return of mild dizziness after change of position to sit upright.  Will give additional 25 meclizine and 1mg  po ativan.. Pt appears suitable for discharge per dr. Saul Fordyce plan.    Carrie Mew, MD 06/09/16 [phone]

## 2016-06-09 NOTE — ED Notes (Signed)
Patient transported to MRI at this time by Almyra Free, EDT.

## 2016-06-09 NOTE — ED Notes (Signed)
Pt discharged home after verbalizing understanding of discharge instructions; nad noted. 

## 2016-06-27 ENCOUNTER — Telehealth: Payer: Self-pay | Admitting: Family Medicine

## 2016-06-27 DIAGNOSIS — Z0000 Encounter for general adult medical examination without abnormal findings: Secondary | ICD-10-CM

## 2016-06-27 DIAGNOSIS — R739 Hyperglycemia, unspecified: Secondary | ICD-10-CM

## 2016-06-27 NOTE — Telephone Encounter (Signed)
-----   Message from Ellamae Sia sent at 06/24/2016  4:46 PM EDT ----- Regarding: Lab orders for Thursday, 4.26.18 Patient is scheduled for CPX labs, please order future labs, Thanks , Karna Christmas

## 2016-07-08 ENCOUNTER — Other Ambulatory Visit (INDEPENDENT_AMBULATORY_CARE_PROVIDER_SITE_OTHER): Payer: Managed Care, Other (non HMO)

## 2016-07-08 DIAGNOSIS — Z0000 Encounter for general adult medical examination without abnormal findings: Secondary | ICD-10-CM | POA: Diagnosis not present

## 2016-07-08 DIAGNOSIS — R739 Hyperglycemia, unspecified: Secondary | ICD-10-CM

## 2016-07-08 LAB — LIPID PANEL
CHOL/HDL RATIO: 2
CHOLESTEROL: 170 mg/dL (ref 0–200)
HDL: 72.9 mg/dL (ref >39.00)
LDL CALC: 80 mg/dL (ref 0–99)
NonHDL: 97.18
TRIGLYCERIDES: 87 mg/dL (ref 0.0–149.0)
VLDL: 17.4 mg/dL (ref 0.0–40.0)

## 2016-07-08 LAB — COMPREHENSIVE METABOLIC PANEL
ALT: 12 U/L (ref 0–35)
AST: 14 U/L (ref 0–37)
Albumin: 4.4 g/dL (ref 3.5–5.2)
Alkaline Phosphatase: 78 U/L (ref 39–117)
BILIRUBIN TOTAL: 0.3 mg/dL (ref 0.2–1.2)
BUN: 11 mg/dL (ref 6–23)
CHLORIDE: 106 meq/L (ref 96–112)
CO2: 28 mEq/L (ref 19–32)
Calcium: 9.5 mg/dL (ref 8.4–10.5)
Creatinine, Ser: 0.97 mg/dL (ref 0.40–1.20)
GFR: 63.75 mL/min (ref >60.00)
GLUCOSE: 110 mg/dL — AB (ref 70–99)
Potassium: 4.3 mEq/L (ref 3.5–5.1)
Sodium: 143 mEq/L (ref 135–145)
Total Protein: 7.2 g/dL (ref 6.0–8.3)

## 2016-07-08 LAB — CBC WITH DIFFERENTIAL/PLATELET
BASOS ABS: 0.1 10*3/uL (ref 0.0–0.1)
BASOS PCT: 0.9 % (ref 0.0–3.0)
EOS ABS: 0.2 10*3/uL (ref 0.0–0.7)
Eosinophils Relative: 2.6 % (ref 0.0–5.0)
HCT: 35.2 % — ABNORMAL LOW (ref 36.0–46.0)
Hemoglobin: 11.1 g/dL — ABNORMAL LOW (ref 12.0–15.0)
LYMPHS ABS: 1.8 10*3/uL (ref 0.7–4.0)
Lymphocytes Relative: 24 % (ref 12.0–46.0)
MCHC: 31.6 g/dL (ref 30.0–36.0)
MCV: 75.9 fl — AB (ref 78.0–100.0)
Monocytes Absolute: 0.4 10*3/uL (ref 0.1–1.0)
Monocytes Relative: 5.3 % (ref 3.0–12.0)
NEUTROS ABS: 4.9 10*3/uL (ref 1.4–7.7)
NEUTROS PCT: 67.2 % (ref 43.0–77.0)
Platelets: 360 10*3/uL (ref 150.0–400.0)
RBC: 4.64 Mil/uL (ref 3.87–5.11)
RDW: 16.9 % — AB (ref 11.5–15.5)
WBC: 7.4 10*3/uL (ref 4.0–10.5)

## 2016-07-08 LAB — HEMOGLOBIN A1C: Hgb A1c MFr Bld: 6.3 % (ref 4.6–6.5)

## 2016-07-08 LAB — TSH: TSH: 2.4 u[IU]/mL (ref 0.35–4.50)

## 2016-07-13 ENCOUNTER — Ambulatory Visit (INDEPENDENT_AMBULATORY_CARE_PROVIDER_SITE_OTHER): Payer: Managed Care, Other (non HMO) | Admitting: Family Medicine

## 2016-07-13 ENCOUNTER — Encounter: Payer: Self-pay | Admitting: Family Medicine

## 2016-07-13 VITALS — BP 126/74 | HR 67 | Temp 98.2°F | Ht 64.25 in | Wt 246.5 lb

## 2016-07-13 DIAGNOSIS — D649 Anemia, unspecified: Secondary | ICD-10-CM | POA: Diagnosis not present

## 2016-07-13 DIAGNOSIS — F341 Dysthymic disorder: Secondary | ICD-10-CM | POA: Diagnosis not present

## 2016-07-13 DIAGNOSIS — Z1211 Encounter for screening for malignant neoplasm of colon: Secondary | ICD-10-CM

## 2016-07-13 DIAGNOSIS — Z0000 Encounter for general adult medical examination without abnormal findings: Secondary | ICD-10-CM

## 2016-07-13 DIAGNOSIS — R739 Hyperglycemia, unspecified: Secondary | ICD-10-CM | POA: Diagnosis not present

## 2016-07-13 NOTE — Assessment & Plan Note (Signed)
Due for 10 year screening colonoscopy Hx of hyperplastic polyps in the past

## 2016-07-13 NOTE — Assessment & Plan Note (Signed)
Doing fairly well with fluoxetine  Dealing with grief after loss of mother  Offered grief counseling if she becomes interested

## 2016-07-13 NOTE — Progress Notes (Signed)
Subjective:    Patient ID: Meagan Conway, female    DOB: 17-Jan-1961, 56 y.o.   MRN: [id number]  HPI Here for health maintenance exam and to review chronic medical problems    Doing ok overall  Lost her mother from unexpected GI bleed and GM in march  Then had an episode of vertigo - better now  Doing ok with grief    Wt Readings from Last 3 Encounters:  07/13/16 246 lb 8 oz (111.8 kg)  06/09/16 250 lb (113.4 kg)  01/26/16 250 lb (113.4 kg)  trying to take care of herself -- on the [employer] program and likes the food (including consultation and weigh ins)  Wearing fitbit - aims for 10,000 steps per day- has been able to add some  Down 4-5 lb  bmi 41.9  Mammogram 4/17-she thinks  Self breast exam-no lumps   Colonoscopy 12/07- hyperplastic polyp Due for that  2/12 neg IFOB  Declines hep C/HIV screen due to low risk  Flu shot -got one this season   Pap 5/17 neg with neg HPV screen About time to return   Tetanus shot 10/16  Zoster imm -would consider if affordable/ Shingrix   Hx of hyperglycemia Lab Results  Component Value Date   HGBA1C 6.3 07/08/2016  stable  Pre diabetic  New diet is excellent  Higher fiber carbs / more fruit and veggies  Cut alcohol intake way back     Hx of mild anemia Lab Results  Component Value Date   WBC 7.4 07/08/2016   HGB 11.1 (L) 07/08/2016   HCT 35.2 (L) 07/08/2016   MCV 75.9 (L) 07/08/2016   PLT 360.0 07/08/2016  she just started taking MVI (unsure if iron)  No periods at all     Chemistry      Component Value Date/Time   NA 143 07/08/2016 0740   K 4.3 07/08/2016 0740   CL 106 07/08/2016 0740   CO2 28 07/08/2016 0740   BUN 11 07/08/2016 0740   CREATININE 0.97 07/08/2016 0740      Component Value Date/Time   CALCIUM 9.5 07/08/2016 0740   ALKPHOS 78 07/08/2016 0740   AST 14 07/08/2016 0740   ALT 12 07/08/2016 0740   BILITOT 0.3 07/08/2016 0740      Glucose 110   Lab Results  Component Value Date   TSH 2.40 07/08/2016     Cholesterol  Lab Results  Component Value Date   CHOL 170 07/08/2016   CHOL 201 (H) 07/02/2015   CHOL 164 07/01/2014   Lab Results  Component Value Date   HDL 72.90 07/08/2016   HDL 75.50 07/02/2015   HDL 64.10 07/01/2014   Lab Results  Component Value Date   LDLCALC 80 07/08/2016   LDLCALC 107 (H) 07/02/2015   LDLCALC 85 07/01/2014   Lab Results  Component Value Date   TRIG 87.0 07/08/2016   TRIG 91.0 07/02/2015   TRIG 77.0 07/01/2014   Lab Results  Component Value Date   CHOLHDL 2 07/08/2016   CHOLHDL 3 07/02/2015   CHOLHDL 3 07/01/2014   No results found for: LDLDIRECT  Excellent cholesterol - improved   Patient Active Problem List   Diagnosis Date Noted  . Anemia 12/29/2014  . Colon cancer screening 07/08/2014  . Hyperglycemia 07/04/2013  . Routine general medical examination at a health care facility 06/04/2011  . GERD (gastroesophageal reflux disease) 03/10/2011  . Morbid obesity ([place]) 03/10/2011  . COLONIC POLYPS 04/22/2010  .  ANXIETY DEPRESSION 02/28/2008   Past Medical History:  Diagnosis Date  . Depression    with anxious features  . Hypertension 05/2005  . Obesity    Past Surgical History:  Procedure Laterality Date  . ENDOMETRIAL ABLATION W/ NOVASURE    . OVARIAN CYST SURGERY  08/2002  . [place]   Social History  Substance Use Topics  . Smoking status: Former Research scientist (life sciences)  . Smokeless tobacco: Never Used     Comment: quit many years ago.  . Alcohol use 0.0 oz/week     Comment: 3-4 times per week   Family History  Problem Relation Age of Onset  . Hypertension Mother   . Hypertension Father   . Heart disease Father 80    MI  . Alcohol abuse Father   . Heart disease Maternal Grandmother   . Heart disease Paternal Grandmother    No Known Allergies Current Outpatient Prescriptions on File Prior to Visit  Medication Sig Dispense Refill  . FLUoxetine (PROZAC) 20 MG capsule TAKE 1  CAPSULE DAILY 90 capsule 3  . meclizine (ANTIVERT) 32 MG tablet Take 1 tablet (32 mg total) by mouth 3 (three) times daily as needed. 30 tablet 0  . meloxicam (MOBIC) 15 MG tablet Take 1 tablet (15 mg total) by mouth daily. 30 tablet 3  . pantoprazole (PROTONIX) 40 MG tablet TAKE 1 TABLET DAILY 90 tablet 3   No current facility-administered medications on file prior to visit.     Review of Systems    Review of Systems  Constitutional: Negative for fever, appetite change, fatigue and unexpected weight change.  Eyes: Negative for pain and visual disturbance.  Respiratory: Negative for cough and shortness of breath.   Cardiovascular: Negative for cp or palpitations    Gastrointestinal: Negative for nausea, diarrhea and constipation.  Genitourinary: Negative for urgency and frequency.  Skin: Negative for pallor or rash   Neurological: Negative for weakness, light-headedness, numbness and headaches. (vertigo is better)  Hematological: Negative for adenopathy. Does not bruise/bleed easily.  Psychiatric/Behavioral: Negative for dysphoric mood. The patient is not nervous/anxious.  pos for grief which she is handling well     Objective:   Physical Exam  Constitutional: She appears well-developed and well-nourished. No distress.  obese and well appearing   HENT:  Head: Normocephalic and atraumatic.  Right Ear: External ear normal.  Left Ear: External ear normal.  Nose: Nose normal.  Mouth/Throat: Oropharynx is clear and moist.  Eyes: Conjunctivae and EOM are normal. Pupils are equal, round, and reactive to light. Right eye exhibits no discharge. Left eye exhibits no discharge. No scleral icterus.  Neck: Normal range of motion. Neck supple. No JVD present. Carotid bruit is not present. No thyromegaly present.  Cardiovascular: Normal rate, regular rhythm, normal heart sounds and intact distal pulses.  Exam reveals no gallop.   Pulmonary/Chest: Effort normal and breath sounds normal. No  respiratory distress. She has no wheezes. She has no rales.  Abdominal: Soft. Bowel sounds are normal. She exhibits no distension and no mass. There is no tenderness.  Genitourinary:  Genitourinary Comments: Exam done by her gyn provider  Musculoskeletal: She exhibits no edema or tenderness.  No kyphosis   Lymphadenopathy:    She has no cervical adenopathy.  Neurological: She is alert. She has normal reflexes. No cranial nerve deficit. She exhibits normal muscle tone. Coordination normal.  Skin: Skin is warm and dry. No rash noted. No erythema. No pallor.  Olive complexion  Lentigines  diffusely  Psychiatric: She has a normal mood and affect.          Assessment & Plan:   Problem List Items Addressed This Visit      Other   Anemia    Mild and stable  Due for colonoscopy this year- ref done        ANXIETY DEPRESSION    Doing fairly well with fluoxetine  Dealing with grief after loss of mother  Offered grief counseling if she becomes interested       Colon cancer screening    Due for 10 year screening colonoscopy Hx of hyperplastic polyps in the past       Relevant Orders   Ambulatory referral to Gastroenterology   Hyperglycemia    Lab Results  Component Value Date   HGBA1C 6.3 07/08/2016   This is stable With wt loss -expect further imp  disc imp of low glycemic diet and wt loss to prevent DM2       Morbid obesity ([place])    Discussed how this problem influences overall health and the risks it imposes  Reviewed plan for weight loss with lower calorie diet (via better food choices and also portion control or program like weight watchers) and exercise building up to or more than 30 minutes 5 days per week including some aerobic activity   Commended on starting the [employer] program - has good support and plan for exercise  Down 4 lb so far      Routine general medical examination at a health care facility - Primary    Reviewed health habits including diet  and exercise and skin cancer prevention Reviewed appropriate screening tests for age  Also reviewed health mt list, fam hx and immunization status , as well as social and family history   Enc her to keep up the good work with diet/exercise/wt loss program  Labs reviewed-improved cholesterol  She will schedule her own mammogram and gyn appt

## 2016-07-13 NOTE — Progress Notes (Signed)
Pre visit review using our clinic review tool, if applicable. No additional management support is needed unless otherwise documented below in the visit note. 

## 2016-07-13 NOTE — Patient Instructions (Addendum)
Don't forget to make your mammogram appointment or have the gyn do it for you   We will refer you for a colonoscopy   If you are interested in the new shingles vaccine (Shingrix) - call your insurance to check on coverage,( you should not get it within 1 month of other vaccines) , then call us for a prescription  for it to take to a pharmacy that gives the shot , or make a nurse visit to get it here depending on your coverage  Keep up the great work with diet and exercise

## 2016-07-13 NOTE — Assessment & Plan Note (Signed)
Lab Results  Component Value Date   HGBA1C 6.3 07/08/2016   This is stable With wt loss -expect further imp  disc imp of low glycemic diet and wt loss to prevent DM2

## 2016-07-13 NOTE — Assessment & Plan Note (Signed)
Reviewed health habits including diet and exercise and skin cancer prevention Reviewed appropriate screening tests for age  Also reviewed health mt list, fam hx and immunization status , as well as social and family history   Enc her to keep up the good work with diet/exercise/wt loss program  Labs reviewed-improved cholesterol  She will schedule her own mammogram and gyn appt

## 2016-07-13 NOTE — Assessment & Plan Note (Signed)
Mild and stable  Due for colonoscopy this year- ref done

## 2016-07-13 NOTE — Assessment & Plan Note (Signed)
Discussed how this problem influences overall health and the risks it imposes  Reviewed plan for weight loss with lower calorie diet (via better food choices and also portion control or program like weight watchers) and exercise building up to or more than 30 minutes 5 days per week including some aerobic activity   Commended on starting the [employer] program - has good support and plan for exercise  Down 4 lb so far

## 2016-08-13 ENCOUNTER — Other Ambulatory Visit: Payer: Self-pay

## 2016-08-13 ENCOUNTER — Telehealth: Payer: Self-pay

## 2016-08-13 DIAGNOSIS — Z1211 Encounter for screening for malignant neoplasm of colon: Secondary | ICD-10-CM

## 2016-08-13 NOTE — Telephone Encounter (Signed)
Gastroenterology Pre-Procedure Review  Request Date: 08/27/16 Requesting Physician: Dr. Allen Norris  PATIENT REVIEW QUESTIONS: The patient responded to the following health history questions as indicated:    1. Are you having any GI issues? no 2. Do you have a personal history of Polyps? yes (polyps 2013) 3. Do you have a family history of Colon Cancer or Polyps? no 4. Diabetes Mellitus? no 5. Joint replacements in the past 12 months?no 6. Major health problems in the past 3 months?no 7. Any artificial heart valves, MVP, or defibrillator?no    MEDICATIONS & ALLERGIES:    Patient reports the following regarding taking any anticoagulation/antiplatelet therapy:   Plavix, Coumadin, Eliquis, Xarelto, Lovenox, Pradaxa, Brilinta, or Effient? no Aspirin? no  Patient confirms/reports the following medications:  Current Outpatient Prescriptions  Medication Sig Dispense Refill  . FLUoxetine (PROZAC) 20 MG capsule TAKE 1 CAPSULE DAILY 90 capsule 3  . meclizine (ANTIVERT) 32 MG tablet Take 1 tablet (32 mg total) by mouth 3 (three) times daily as needed. 30 tablet 0  . meloxicam (MOBIC) 15 MG tablet Take 1 tablet (15 mg total) by mouth daily. 30 tablet 3  . pantoprazole (PROTONIX) 40 MG tablet TAKE 1 TABLET DAILY 90 tablet 3   No current facility-administered medications for this visit.     Patient confirms/reports the following allergies:  No Known Allergies  No orders of the defined types were placed in this encounter.   AUTHORIZATION INFORMATION Primary Insurance: 1D#: Group #:  Secondary Insurance: 1D#: Group #:  SCHEDULE INFORMATION: Date: 08/27/16 Friday Time: Location:[facility]

## 2016-08-23 ENCOUNTER — Encounter: Payer: Self-pay | Admitting: *Deleted

## 2016-08-25 NOTE — Discharge Instructions (Signed)
General Anesthesia, Adult, Care After °These instructions provide you with information about caring for yourself after your procedure. Your health care provider may also give you more specific instructions. Your treatment has been planned according to current medical practices, but problems sometimes occur. Call your health care provider if you have any problems or questions after your procedure. °What can I expect after the procedure? °After the procedure, it is common to have: °· Vomiting. °· A sore throat. °· Mental slowness. ° °It is common to feel: °· Nauseous. °· Cold or shivery. °· Sleepy. °· Tired. °· Sore or achy, even in parts of your body where you did not have surgery. ° °Follow these instructions at home: °For at least 24 hours after the procedure: °· Do not: °? Participate in activities where you could fall or become injured. °? Drive. °? Use heavy machinery. °? Drink alcohol. °? Take sleeping pills or medicines that cause drowsiness. °? Make important decisions or sign legal documents. °? Take care of children on your own. °· Rest. °Eating and drinking °· If you vomit, drink water, juice, or soup when you can drink without vomiting. °· Drink enough fluid to keep your urine clear or pale yellow. °· Make sure you have little or no nausea before eating solid foods. °· Follow the diet recommended by your health care provider. °General instructions °· Have a responsible adult stay with you until you are awake and alert. °· Return to your normal activities as told by your health care provider. Ask your health care provider what activities are safe for you. °· Take over-the-counter and prescription medicines only as told by your health care provider. °· If you smoke, do not smoke without supervision. °· Keep all follow-up visits as told by your health care provider. This is important. °Contact a health care provider if: °· You continue to have nausea or vomiting at home, and medicines are not helpful. °· You  cannot drink fluids or start eating again. °· You cannot urinate after 8-12 hours. °· You develop a skin rash. °· You have fever. °· You have increasing redness at the site of your procedure. °Get help right away if: °· You have difficulty breathing. °· You have chest pain. °· You have unexpected bleeding. °· You feel that you are having a life-threatening or urgent problem. °This information is not intended to replace advice given to you by your health care provider. Make sure you discuss any questions you have with your health care provider. °Document Released: 06/07/2000 Document Revised: 08/04/2015 Document Reviewed: 02/13/2015 °Elsevier Interactive Patient Education © 2018 [employer] ° °

## 2016-08-27 ENCOUNTER — Ambulatory Visit: Payer: Managed Care, Other (non HMO) | Admitting: Anesthesiology

## 2016-08-27 ENCOUNTER — Encounter
Admission: RE | Disposition: A | Discharge status: Home / Self Care | Payer: Self-pay | Source: Ambulatory Visit | Attending: Gastroenterology

## 2016-08-27 ENCOUNTER — Ambulatory Visit: Admit: 2016-08-27 | Payer: Managed Care, Other (non HMO) | Admitting: Gastroenterology

## 2016-08-27 ENCOUNTER — Ambulatory Visit
Admission: RE | Admit: 2016-08-27 | Discharge: 2016-08-27 | Disposition: A | Discharge status: Home / Self Care | Payer: Managed Care, Other (non HMO) | Source: Ambulatory Visit | Attending: Gastroenterology | Admitting: Gastroenterology

## 2016-08-27 DIAGNOSIS — Z1211 Encounter for screening for malignant neoplasm of colon: Secondary | ICD-10-CM

## 2016-08-27 DIAGNOSIS — K219 Gastro-esophageal reflux disease without esophagitis: Secondary | ICD-10-CM | POA: Diagnosis not present

## 2016-08-27 DIAGNOSIS — I10 Essential (primary) hypertension: Secondary | ICD-10-CM | POA: Diagnosis not present

## 2016-08-27 DIAGNOSIS — F329 Major depressive disorder, single episode, unspecified: Secondary | ICD-10-CM | POA: Diagnosis not present

## 2016-08-27 DIAGNOSIS — K633 Ulcer of intestine: Secondary | ICD-10-CM

## 2016-08-27 DIAGNOSIS — Z87891 Personal history of nicotine dependence: Secondary | ICD-10-CM | POA: Insufficient documentation

## 2016-08-27 DIAGNOSIS — Z6841 Body Mass Index (BMI) 40.0 and over, adult: Secondary | ICD-10-CM | POA: Diagnosis not present

## 2016-08-27 HISTORY — PX: COLONOSCOPY WITH PROPOFOL: SHX5780

## 2016-08-27 HISTORY — DX: Dizziness and giddiness: R42

## 2016-08-27 HISTORY — DX: Gastro-esophageal reflux disease without esophagitis: K21.9

## 2016-08-27 SURGERY — COLONOSCOPY WITH PROPOFOL
Anesthesia: General

## 2016-08-27 SURGERY — COLONOSCOPY WITH PROPOFOL
Anesthesia: General | Site: Rectum | Wound class: Contaminated

## 2016-08-27 MED ORDER — PROPOFOL 10 MG/ML IV BOLUS
INTRAVENOUS | Status: DC | PRN
  Administered 2016-08-27: 40 mg via INTRAVENOUS
  Administered 2016-08-27: 100 mg via INTRAVENOUS
  Administered 2016-08-27: 50 mg via INTRAVENOUS
  Administered 2016-08-27: 40 mg via INTRAVENOUS
  Administered 2016-08-27: 30 mg via INTRAVENOUS

## 2016-08-27 MED ORDER — ACETAMINOPHEN 325 MG PO TABS: 325.0000 mg | ORAL_TABLET | ORAL | Status: DC | PRN

## 2016-08-27 MED ORDER — LACTATED RINGERS IV SOLN
INTRAVENOUS | Status: DC
  Administered 2016-08-27: 08:00:00 via INTRAVENOUS

## 2016-08-27 MED ORDER — LIDOCAINE HCL (CARDIAC) 20 MG/ML IV SOLN
INTRAVENOUS | Status: DC | PRN
  Administered 2016-08-27: 50 mg via INTRAVENOUS

## 2016-08-27 MED ORDER — ACETAMINOPHEN 160 MG/5ML PO SOLN: 325.0000 mg | ORAL | Status: DC | PRN

## 2016-08-27 MED ORDER — STERILE WATER FOR IRRIGATION IR SOLN
Status: DC | PRN
  Administered 2016-08-27: 09:00:00

## 2016-08-27 SURGICAL SUPPLY — 23 items
CANISTER SUCT 1200ML W/VALVE (MISCELLANEOUS) ×2 IMPLANT
CLIP HMST 235XBRD CATH ROT (MISCELLANEOUS) IMPLANT
CLIP RESOLUTION 360 11X235 (MISCELLANEOUS)
FCP ESCP3.2XJMB 240X2.8X (MISCELLANEOUS) ×1
FORCEPS BIOP RAD 4 LRG CAP 4 (CUTTING FORCEPS) IMPLANT
FORCEPS BIOP RJ4 240 W/NDL (MISCELLANEOUS) ×2
FORCEPS ESCP3.2XJMB 240X2.8X (MISCELLANEOUS) IMPLANT
GOWN CVR UNV OPN BCK APRN NK (MISCELLANEOUS) ×2 IMPLANT
GOWN ISOL THUMB LOOP REG UNIV (MISCELLANEOUS) ×4
INJECTOR VARIJECT VIN23 (MISCELLANEOUS) IMPLANT
KIT DEFENDO VALVE AND CONN (KITS) IMPLANT
KIT ENDO PROCEDURE OLY (KITS) ×2 IMPLANT
MARKER SPOT ENDO TATTOO 5ML (MISCELLANEOUS) IMPLANT
PAD GROUND ADULT SPLIT (MISCELLANEOUS) IMPLANT
PROBE APC STR FIRE (PROBE) IMPLANT
RETRIEVER NET ROTH 2.5X230 LF (MISCELLANEOUS) IMPLANT
SNARE SHORT THROW 13M SML OVAL (MISCELLANEOUS) IMPLANT
SNARE SHORT THROW 30M LRG OVAL (MISCELLANEOUS) IMPLANT
SNARE SNG USE RND 15MM (INSTRUMENTS) IMPLANT
SPOT EX ENDOSCOPIC TATTOO (MISCELLANEOUS)
TRAP ETRAP POLY (MISCELLANEOUS) IMPLANT
VARIJECT INJECTOR VIN23 (MISCELLANEOUS)
WATER STERILE IRR 250ML POUR (IV SOLUTION) ×2 IMPLANT

## 2016-08-27 NOTE — Anesthesia Preprocedure Evaluation (Signed)
Anesthesia Evaluation  Patient identified by MRN, date of birth, ID band Patient awake    Reviewed: Allergy & Precautions, H&P , NPO status , Patient's Chart, lab work & pertinent test results  Airway Mallampati: II  TM Distance: >3 FB Neck ROM: full    Dental no notable dental hx.    Pulmonary former smoker,    Pulmonary exam normal        Cardiovascular hypertension, Normal cardiovascular exam     Neuro/Psych PSYCHIATRIC DISORDERS    GI/Hepatic GERD  ,  Endo/Other  Morbid obesity  Renal/GU      Musculoskeletal   Abdominal   Peds  Hematology   Anesthesia Other Findings   Reproductive/Obstetrics                             Anesthesia Physical Anesthesia Plan  ASA: II  Anesthesia Plan: General   Post-op Pain Management:    Induction:   PONV Risk Score and Plan: 3 and Propofol  Airway Management Planned:   Additional Equipment:   Intra-op Plan:   Post-operative Plan:   Informed Consent: I have reviewed the patients History and Physical, chart, labs and discussed the procedure including the risks, benefits and alternatives for the proposed anesthesia with the patient or authorized representative who has indicated his/her understanding and acceptance.     Plan Discussed with:   Anesthesia Plan Comments:         Anesthesia Quick Evaluation

## 2016-08-27 NOTE — H&P (Signed)
Lucilla Lame, MD [facility] [address], [place], [place] [address] Phone: [phone] Fax : [phone]  Primary Care Physician:  Tower, Wynelle Fanny, MD Primary Gastroenterologist:  Dr. Allen Norris  Pre-Procedure History & Physical: HPI:  Meagan Conway is a 56 y.o. female is here for a screening colonoscopy.   Past Medical History:  Diagnosis Date  . Depression    with anxious features  . GERD (gastroesophageal reflux disease)   . Hypertension 05/2005   H/O. no meds currently  . Obesity   . Vertigo    last episode 03/18    Past Surgical History:  Procedure Laterality Date  . ENDOMETRIAL ABLATION W/ NOVASURE    . OVARIAN CYST SURGERY  08/2002  . [place]    Prior to Admission medications   Medication Sig Start Date End Date Taking? Authorizing Provider  FLUoxetine (PROZAC) 20 MG capsule TAKE 1 CAPSULE DAILY 09/17/15  Yes Tower, Wynelle Fanny, MD  meclizine (ANTIVERT) 32 MG tablet Take 1 tablet (32 mg total) by mouth 3 (three) times daily as needed. 06/09/16  Yes Gregor Hams, MD  Multiple Vitamins-Minerals (MULTIVITAMIN PO) Take by mouth.   Yes [provider]  pantoprazole (PROTONIX) 40 MG tablet TAKE 1 TABLET DAILY 09/17/15  Yes Tower, Wynelle Fanny, MD  meloxicam (MOBIC) 15 MG tablet Take 1 tablet (15 mg total) by mouth daily. 01/26/16   Hyatt, Max T, DPM    Allergies as of 08/13/2016  . (No Known Allergies)    Family History  Problem Relation Age of Onset  . Hypertension Mother        died of GI bleed-unexpected   . Hypertension Father   . Heart disease Father 59       MI  . Alcohol abuse Father   . Heart disease Maternal Grandmother   . Heart disease Paternal Grandmother     Social History   Social History  . Marital status: Married    Spouse name: N/A  . Number of children: N/A  . Years of education: N/A   Occupational History  . Not on file.   Social History Main Topics  . Smoking status: Former Smoker    Quit date:  2014  . Smokeless tobacco: Never Used     Comment: quit many years ago.  . Alcohol use 3.0 oz/week    2 Glasses of wine, 3 Cans of beer per week     Comment:    . Drug use: No  . Sexual activity: Yes    Partners: Male    Birth control/ protection: None     Comment: husband has a vasectomy   Other Topics Concern  . Not on file   Social History Narrative  . No narrative on file    Review of Systems: See HPI, otherwise negative ROS  Physical Exam: BP 123/81   Pulse 68   Temp 97.7 F (36.5 C) (Temporal)   Resp 16   Ht 5\' 4"  (1.626 m)   Wt 235 lb (106.6 kg)   SpO2 95%   BMI 40.34 kg/m  General:   Alert,  pleasant and cooperative in NAD Head:  Normocephalic and atraumatic. Neck:  Supple; no masses or thyromegaly. Lungs:  Clear throughout to auscultation.    Heart:  Regular rate and rhythm. Abdomen:  Soft, nontender and nondistended. Normal bowel sounds, without guarding, and without rebound.   Neurologic:  Alert and  oriented x4;  grossly normal neurologically.  Impression/Plan: Meagan Conway  Meagan Conway is now here to undergo a screening colonoscopy.  Risks, benefits, and alternatives regarding colonoscopy have been reviewed with the patient.  Questions have been answered.  All parties agreeable.

## 2016-08-27 NOTE — Transfer of Care (Signed)
Immediate Anesthesia Transfer of Care Note  Patient: Meagan Conway  Procedure(s) Performed: Procedure(s): COLONOSCOPY WITH PROPOFOL (N/A)  Patient Location: PACU  Anesthesia Type: General  Level of Consciousness: awake, alert  and patient cooperative  Airway and Oxygen Therapy: Patient Spontanous Breathing and Patient connected to supplemental oxygen  Post-op Assessment: Post-op Vital signs reviewed, Patient's Cardiovascular Status Stable, Respiratory Function Stable, Patent Airway and No signs of Nausea or vomiting  Post-op Vital Signs: Reviewed and stable  Complications: No apparent anesthesia complications

## 2016-08-27 NOTE — Anesthesia Procedure Notes (Signed)
Date/Time: 08/27/2016 8:45 AM Performed by: Cameron Ali Pre-anesthesia Checklist: Patient identified, Emergency Drugs available, Suction available, Timeout performed and Patient being monitored Patient Re-evaluated:Patient Re-evaluated prior to inductionOxygen Delivery Method: Nasal cannula Placement Confirmation: positive ETCO2

## 2016-08-27 NOTE — Anesthesia Postprocedure Evaluation (Signed)
Anesthesia Post Note  Patient: Meagan Conway  Procedure(s) Performed: Procedure(s) (LRB): COLONOSCOPY WITH PROPOFOL (N/A)  Patient location during evaluation: PACU Anesthesia Type: General Level of consciousness: awake and alert and oriented Pain management: satisfactory to patient Vital Signs Assessment: post-procedure vital signs reviewed and stable Respiratory status: spontaneous breathing, nonlabored ventilation and respiratory function stable Cardiovascular status: blood pressure returned to baseline and stable Postop Assessment: Adequate PO intake and No signs of nausea or vomiting Anesthetic complications: no    Raliegh Ip

## 2016-08-27 NOTE — Op Note (Signed)
[facility] Gastroenterology Patient Name: Meagan Conway Procedure Date: 08/27/2016 8:38 AM MRN: [id number] Account #: [id number] Date of Birth: 1960/06/30 Admit Type: Outpatient Age: 56 Room: [facility] OR ROOM 01 Gender: Female Note Status: Finalized Procedure:            Colonoscopy Indications:          Screening for colorectal malignant neoplasm Providers:            Lucilla Lame MD, MD Referring MD:         Wynelle Fanny. Tower (Referring MD) Medicines:            Propofol per Anesthesia Complications:        No immediate complications. Procedure:            Pre-Anesthesia Assessment:                       - Prior to the procedure, a History and Physical was                        performed, and patient medications and allergies were                        reviewed. The patient's tolerance of previous                        anesthesia was also reviewed. The risks and benefits of                        the procedure and the sedation options and risks were                        discussed with the patient. All questions were                        answered, and informed consent was obtained. Prior                        Anticoagulants: The patient has taken no previous                        anticoagulant or antiplatelet agents. ASA Grade                        Assessment: II - A patient with mild systemic disease.                        After reviewing the risks and benefits, the patient was                        deemed in satisfactory condition to undergo the                        procedure.                       After obtaining informed consent, the colonoscope was                        passed under direct vision. Throughout the procedure,  the patient's blood pressure, pulse, and oxygen                        saturations were monitored continuously. The Olympus                        CF-HQ190L Colonoscope (S#. [id number]) was introduced                  through the anus and advanced to the the terminal                        ileum. The colonoscopy was performed without                        difficulty. The patient tolerated the procedure well.                        The quality of the bowel preparation was excellent. Findings:      The perianal and digital rectal examinations were normal.      The terminal ileum appeared normal.      Discontinuous areas of nonbleeding ulcerated mucosa with no stigmata of       recent bleeding were present in the transverse colon, in the ascending       colon and in the cecum. Biopsies were taken with a cold forceps for       histology. Impression:           - The examined portion of the ileum was normal.                       - Mucosal ulceration. Biopsied. Recommendation:       - Discharge patient to home.                       - Resume previous diet.                       - Continue present medications.                       - Await pathology results. Procedure Code(s):    --- Professional ---                       [id number], Colonoscopy, flexible; with biopsy, single or                        multiple Diagnosis Code(s):    --- Professional ---                       Z12.11, Encounter for screening for malignant neoplasm                        of colon                       K63.3, Ulcer of intestine CPT copyright 2016 American Medical Association. All rights reserved. The codes documented in this report are preliminary and upon coder review may  be revised to meet current compliance requirements. Lucilla Lame MD, MD 08/27/2016 9:03:37 AM This report has been signed electronically. Number of Addenda: 0 Note Initiated On: 08/27/2016 8:38  AM Scope Withdrawal Time: 0 hours 6 minutes 17 seconds  Total Procedure Duration: 0 hours 11 minutes 40 seconds       [facility]

## 2016-08-30 ENCOUNTER — Encounter: Payer: Self-pay | Admitting: Gastroenterology

## 2016-09-01 ENCOUNTER — Encounter: Payer: Self-pay | Admitting: Gastroenterology

## 2016-09-06 ENCOUNTER — Ambulatory Visit (INDEPENDENT_AMBULATORY_CARE_PROVIDER_SITE_OTHER): Payer: Managed Care, Other (non HMO) | Admitting: Obstetrics & Gynecology

## 2016-09-06 ENCOUNTER — Encounter: Payer: Self-pay | Admitting: Obstetrics & Gynecology

## 2016-09-06 ENCOUNTER — Telehealth: Payer: Self-pay

## 2016-09-06 VITALS — BP 152/90 | HR 64 | Ht 64.5 in | Wt 243.0 lb

## 2016-09-06 DIAGNOSIS — Z01419 Encounter for gynecological examination (general) (routine) without abnormal findings: Secondary | ICD-10-CM | POA: Diagnosis not present

## 2016-09-06 NOTE — Telephone Encounter (Signed)
LVM for pt to return my call to schedule a procedure follow up appt.

## 2016-09-06 NOTE — Progress Notes (Signed)
Subjective:    Meagan Conway is a 56 y.o. MW P2 (3 grands) female who presents for an annual exam. The patient has no complaints today. The patient is sexually active. GYN screening history: last pap: was normal. The patient wears seatbelts: yes. The patient participates in regular exercise: no. Has the patient ever been transfused or tattooed?: yes. (eyeliner) The patient reports that there is not domestic violence in her life.   Menstrual History: OB History    Gravida Para Term Preterm AB Living   2 2 2     2    SAB TAB Ectopic Multiple Live Births           2      Menarche age: 24 No LMP recorded. Patient has had an ablation.    The following portions of the patient's history were reviewed and updated as appropriate: allergies, current medications, past family history, past medical history, past social history, past surgical history and problem list.  Review of Systems Pertinent items are noted in HPI.   Works for [employer]) Married for 14 years, denies dysparenia Had an ablation in 2010, no bleeding since then Needs mammo Had colonoscopy recently Dr. Glori Bickers is her FP   Objective:    BP (!) 152/90   Pulse 64   Ht 5' 4.5" (1.638 m)   Wt 243 lb (110.2 kg)   BMI 41.07 kg/m   General Appearance:    Alert, cooperative, no distress, appears stated age  Head:    Normocephalic, without obvious abnormality, atraumatic  Eyes:    PERRL, conjunctiva/corneas clear, EOM's intact, fundi    benign, both eyes  Ears:    Normal TM's and external ear canals, both ears  Nose:   Nares normal, septum midline, mucosa normal, no drainage    or sinus tenderness  Throat:   Lips, mucosa, and tongue normal; teeth and gums normal  Neck:   Supple, symmetrical, trachea midline, no adenopathy;    thyroid:  no enlargement/tenderness/nodules; no carotid   bruit or JVD  Back:     Symmetric, no curvature, ROM normal, no CVA tenderness  Lungs:     Clear to auscultation bilaterally, respirations  unlabored  Chest Wall:    No tenderness or deformity   Heart:    Regular rate and rhythm, S1 and S2 normal, no murmur, rub   or gallop  Breast Exam:    No tenderness, masses, or nipple abnormality  Abdomen:     Soft, non-tender, bowel sounds active all four quadrants,    no masses, no organomegaly  Genitalia:    Normal female without lesion, discharge or tenderness,NSSmid plane, NT, no palpable adnexal masses     Extremities:   Extremities normal, atraumatic, no cyanosis or edema  Pulses:   2+ and symmetric all extremities  Skin:   Skin color, texture, turgor normal, no rashes or lesions  Lymph nodes:   Cervical, supraclavicular, and axillary nodes normal  Neurologic:   CNII-XII intact, normal strength, sensation and reflexes    throughout  .    Assessment:    Healthy female exam.    Plan:     Thin prep Pap smear. with cotesting mammo

## 2016-09-06 NOTE — Telephone Encounter (Signed)
-----   Message from Lucilla Lame, MD sent at 09/03/2016  2:13 PM EDT ----- Please have the patient come in for a follow up.

## 2016-09-07 LAB — CYTOLOGY - PAP
DIAGNOSIS: NEGATIVE
HPV: NOT DETECTED

## 2016-09-08 ENCOUNTER — Other Ambulatory Visit: Payer: Self-pay | Admitting: Family Medicine

## 2016-09-14 NOTE — Telephone Encounter (Signed)
Left vm again for pt to return call and schedule a follow up appt to discuss colonoscopy results.

## 2016-09-23 ENCOUNTER — Encounter: Payer: Self-pay | Admitting: *Deleted

## 2016-09-23 ENCOUNTER — Other Ambulatory Visit: Payer: Self-pay | Admitting: *Deleted

## 2016-09-23 DIAGNOSIS — Z01419 Encounter for gynecological examination (general) (routine) without abnormal findings: Secondary | ICD-10-CM

## 2016-11-23 ENCOUNTER — Ambulatory Visit (INDEPENDENT_AMBULATORY_CARE_PROVIDER_SITE_OTHER): Payer: Managed Care, Other (non HMO) | Admitting: Gastroenterology

## 2016-11-23 ENCOUNTER — Encounter: Payer: Self-pay | Admitting: Gastroenterology

## 2016-11-23 VITALS — BP 126/82 | HR 66 | Temp 98.2°F | Ht 64.5 in | Wt 246.2 lb

## 2016-11-23 DIAGNOSIS — K529 Noninfective gastroenteritis and colitis, unspecified: Secondary | ICD-10-CM

## 2016-11-23 NOTE — Progress Notes (Signed)
    Primary Care Physician: Tower, Wynelle Fanny, MD  Primary Gastroenterologist:  Dr. Lucilla Lame  Chief Complaint  Patient presents with  . Follow up colonoscopy results    HPI: Meagan Conway is a 56 y.o. female here for follow-up after colonoscopy.  The patient's hospital showed multiple ulcers throughout the colon.  The pathology showed the ulcers to be acute without any chronicity.  The differential diagnosis included early inflammatory bowel disease versus medications versus NSAIDs. The patient denies any diarrhea or signs of inflammatory bowel disease.  She does report that she takes NSAIDs intermittently and will sometimes take 4 to time.  She does not recall if she had taken anti-inflammatories around the time of colonoscopy.  Current Outpatient Prescriptions  Medication Sig Dispense Refill  . FLUoxetine (PROZAC) 20 MG capsule TAKE 1 CAPSULE DAILY 90 capsule 3  . meclizine (ANTIVERT) 32 MG tablet Take 1 tablet (32 mg total) by mouth 3 (three) times daily as needed. 30 tablet 0  . Multiple Vitamins-Minerals (MULTIVITAMIN PO) Take by mouth.    . pantoprazole (PROTONIX) 40 MG tablet TAKE 1 TABLET DAILY 90 tablet 3  . meloxicam (MOBIC) 15 MG tablet Take 1 tablet (15 mg total) by mouth daily. (Patient not taking: Reported on 11/23/2016) 30 tablet 3   No current facility-administered medications for this visit.     Allergies as of 11/23/2016  . (No Known Allergies)    ROS:  General: Negative for anorexia, weight loss, fever, chills, fatigue, weakness. ENT: Negative for hoarseness, difficulty swallowing , nasal congestion. CV: Negative for chest pain, angina, palpitations, dyspnea on exertion, peripheral edema.  Respiratory: Negative for dyspnea at rest, dyspnea on exertion, cough, sputum, wheezing.  GI: See history of present illness. GU:  Negative for dysuria, hematuria, urinary incontinence, urinary frequency, nocturnal urination.  Endo: Negative for unusual weight change.      Physical Examination:   BP 126/82   Pulse 66   Temp 98.2 F (36.8 C) (Oral)   Ht 5' 4.5" (1.638 m)   Wt 246 lb 3.2 oz (111.7 kg)   BMI 41.61 kg/m   General: Well-nourished, well-developed in no acute distress.  Eyes: No icterus. Conjunctivae pink. Extremities: No lower extremity edema. No clubbing or deformities. Neuro: Alert and oriented x 3.  Grossly intact. Skin: Warm and dry, no jaundice.   Psych: Alert and cooperative, normal mood and affect.  Labs:    Imaging Studies: No results found.  Assessment and Plan:   Meagan Conway is a 56 y.o. y/o female who had ulcerations throughout the colon that the pathology showed to be acute without any chronicity.  At the discussing the possibilities with the patient it appears that her anti-inflammatory medication use can be causing this.  The patient has been told to try and refrain from using NSAIDs.  The patient has been explained the plan and agrees with it.    Lucilla Lame, MD. [name]   Note: This dictation was prepared with Dragon dictation along with smaller phrase technology. Any transcriptional errors that result from this process are unintentional.

## 2017-06-20 ENCOUNTER — Encounter: Payer: Self-pay | Admitting: Radiology

## 2017-07-06 ENCOUNTER — Telehealth: Payer: Self-pay | Admitting: Family Medicine

## 2017-07-06 DIAGNOSIS — Z0000 Encounter for general adult medical examination without abnormal findings: Secondary | ICD-10-CM

## 2017-07-06 DIAGNOSIS — R739 Hyperglycemia, unspecified: Secondary | ICD-10-CM

## 2017-07-06 NOTE — Telephone Encounter (Signed)
-----   Message from Lendon Collar, RT sent at 07/05/2017  2:08 PM EDT ----- Regarding: Lab orders for May 2nd Please enter lab orders for Thursday May 2nd. Thanks-Lauren

## 2017-07-14 ENCOUNTER — Other Ambulatory Visit (INDEPENDENT_AMBULATORY_CARE_PROVIDER_SITE_OTHER): Payer: Managed Care, Other (non HMO)

## 2017-07-14 DIAGNOSIS — Z0000 Encounter for general adult medical examination without abnormal findings: Secondary | ICD-10-CM | POA: Diagnosis not present

## 2017-07-14 DIAGNOSIS — R739 Hyperglycemia, unspecified: Secondary | ICD-10-CM

## 2017-07-14 LAB — LIPID PANEL
CHOL/HDL RATIO: 2
CHOLESTEROL: 180 mg/dL (ref 0–200)
HDL: 88.2 mg/dL (ref >39.00)
LDL CALC: 83 mg/dL (ref 0–99)
NonHDL: 91.3
TRIGLYCERIDES: 42 mg/dL (ref 0.0–149.0)
VLDL: 8.4 mg/dL (ref 0.0–40.0)

## 2017-07-14 LAB — COMPREHENSIVE METABOLIC PANEL
ALBUMIN: 4.2 g/dL (ref 3.5–5.2)
ALK PHOS: 76 U/L (ref 39–117)
ALT: 11 U/L (ref 0–35)
AST: 12 U/L (ref 0–37)
BUN: 19 mg/dL (ref 6–23)
CALCIUM: 9 mg/dL (ref 8.4–10.5)
CHLORIDE: 106 meq/L (ref 96–112)
CO2: 27 mEq/L (ref 19–32)
CREATININE: 0.96 mg/dL (ref 0.40–1.20)
GFR: 64.27 mL/min (ref >60.00)
Glucose, Bld: 111 mg/dL — ABNORMAL HIGH (ref 70–99)
POTASSIUM: 4.2 meq/L (ref 3.5–5.1)
Sodium: 143 mEq/L (ref 135–145)
TOTAL PROTEIN: 6.7 g/dL (ref 6.0–8.3)
Total Bilirubin: 0.3 mg/dL (ref 0.2–1.2)

## 2017-07-14 LAB — CBC WITH DIFFERENTIAL/PLATELET
BASOS PCT: 1.3 % (ref 0.0–3.0)
Basophils Absolute: 0.1 10*3/uL (ref 0.0–0.1)
EOS ABS: 0.2 10*3/uL (ref 0.0–0.7)
EOS PCT: 3 % (ref 0.0–5.0)
HEMATOCRIT: 28.4 % — AB (ref 36.0–46.0)
Hemoglobin: 9 g/dL — ABNORMAL LOW (ref 12.0–15.0)
LYMPHS PCT: 24.2 % (ref 12.0–46.0)
Lymphs Abs: 1.8 10*3/uL (ref 0.7–4.0)
MCHC: 31.6 g/dL (ref 30.0–36.0)
MCV: 66.1 fl — ABNORMAL LOW (ref 78.0–100.0)
Monocytes Absolute: 0.4 10*3/uL (ref 0.1–1.0)
Monocytes Relative: 4.6 % (ref 3.0–12.0)
Neutro Abs: 5.1 10*3/uL (ref 1.4–7.7)
Neutrophils Relative %: 66.9 % (ref 43.0–77.0)
Platelets: 358 10*3/uL (ref 150.0–400.0)
RBC: 4.29 Mil/uL (ref 3.87–5.11)
RDW: 19.1 % — AB (ref 11.5–15.5)
WBC: 7.6 10*3/uL (ref 4.0–10.5)

## 2017-07-14 LAB — TSH: TSH: 2.08 u[IU]/mL (ref 0.35–4.50)

## 2017-07-14 LAB — HEMOGLOBIN A1C: Hgb A1c MFr Bld: 6.2 % (ref 4.6–6.5)

## 2017-07-18 ENCOUNTER — Encounter: Payer: Self-pay | Admitting: Family Medicine

## 2017-07-18 ENCOUNTER — Ambulatory Visit (INDEPENDENT_AMBULATORY_CARE_PROVIDER_SITE_OTHER): Payer: Managed Care, Other (non HMO) | Admitting: Family Medicine

## 2017-07-18 VITALS — BP 126/74 | HR 68 | Temp 98.6°F | Ht 64.5 in | Wt 245.5 lb

## 2017-07-18 DIAGNOSIS — R739 Hyperglycemia, unspecified: Secondary | ICD-10-CM

## 2017-07-18 DIAGNOSIS — D649 Anemia, unspecified: Secondary | ICD-10-CM

## 2017-07-18 DIAGNOSIS — Z0000 Encounter for general adult medical examination without abnormal findings: Secondary | ICD-10-CM | POA: Diagnosis not present

## 2017-07-18 MED ORDER — POLYSACCHARIDE IRON COMPLEX 150 MG PO CAPS: 150.0000 mg | ORAL_CAPSULE | Freq: Two times a day (BID) | ORAL | 3 refills | Status: DC

## 2017-07-18 MED ORDER — FLUOXETINE HCL 20 MG PO CAPS: 20.0000 mg | ORAL_CAPSULE | Freq: Every day | ORAL | 3 refills | Status: DC

## 2017-07-18 MED ORDER — PANTOPRAZOLE SODIUM 40 MG PO TBEC: 40.0000 mg | DELAYED_RELEASE_TABLET | Freq: Every day | ORAL | 3 refills | Status: DC

## 2017-07-18 NOTE — Assessment & Plan Note (Signed)
Discussed how this problem influences overall health and the risks it imposes  Reviewed plan for weight loss with lower calorie diet (via better food choices and also portion control or program like weight watchers) and exercise building up to or more than 30 minutes 5 days per week including some aerobic activity    

## 2017-07-18 NOTE — Patient Instructions (Addendum)
Try to get most of your carbohydrates from produce (with the exception of white potatoes)  Eat less bread/pasta/rice/snack foods/cereals/sweets and other items from the middle of the grocery store (processed carbs)  If you are interested in the new shingles vaccine (Shingrix) - call your local pharmacy to check on coverage and availability  If affordable get on a wait list   I think you are iron deficient  Do the stool cards please  Then start nu iron twice -- use a stool softener if needed for constipation if it occurs   Lets do labs again in 4-6 weeks

## 2017-07-18 NOTE — Assessment & Plan Note (Signed)
Lab Results  Component Value Date   HGBA1C 6.2 07/14/2017   disc imp of low glycemic diet and wt loss to prevent DM2

## 2017-07-18 NOTE — Assessment & Plan Note (Signed)
Reviewed health habits including diet and exercise and skin cancer prevention Reviewed appropriate screening tests for age  Also reviewed health mt list, fam hx and immunization status , as well as social and family history   Labs reviewed Disc plan for diet and exercise  tx anemia  Heme occult cards times 3  Disc shingrix vaccine

## 2017-07-18 NOTE — Progress Notes (Signed)
Subjective:    Patient ID: Meagan Conway, female    DOB: [birth year], 57 y.o.   MRN: [id number]  HPI Here for health maintenance exam and to review chronic medical problems    Nothing new going on  Feeling ok  Some fatigue   Wt Readings from Last 3 Encounters:  07/18/17 245 lb 8 oz (111.4 kg)  11/23/16 246 lb 3.2 oz (111.7 kg)  09/06/16 243 lb (110.2 kg)  doing terrible with diet and exercise  Motivation is difficult due to fatigue  Tends to eat poorly when she is tired  41.49 kg/m   Mammogram 7.18 -neg  Self breast exam - no lumps   Pap 6/18 neg with Dr Hulan Fray  Sees her in jun e  Tetanus shot 10/16   Colonoscopy 6/18 - bx of mucosal irritation  Was told it may be from nsaids (so she backed off on nsaids)  No bleeding  No blood in stool   Zoster status -has not had dz or vaccine   BP BP Readings from Last 3 Encounters:  07/18/17 126/74  11/23/16 126/82  09/06/16 (!) 152/90   Hyperglycemia Lab Results  Component Value Date   HGBA1C 6.2 07/14/2017  to tired to exercise  Has a great place to walk  Down from 6.3  Hx of mild anemia  Lab Results  Component Value Date   WBC 7.6 07/14/2017   HGB 9.0 (L) 07/14/2017   HCT 28.4 (L) 07/14/2017   MCV 66.1 (L) 07/14/2017   PLT 358.0 07/14/2017   Lab Results  Component Value Date   FERRITIN 59.4 11/24/2010  not having periods Has not given blood  No blood in stool No abdominal pain   She used to take iron  Tolerated it ok    Cholesterol Lab Results  Component Value Date   CHOL 180 07/14/2017   CHOL 170 07/08/2016   CHOL 201 (H) 07/02/2015   Lab Results  Component Value Date   HDL 88.20 07/14/2017   HDL 72.90 07/08/2016   HDL 75.50 07/02/2015   Lab Results  Component Value Date   LDLCALC 83 07/14/2017   LDLCALC 80 07/08/2016   LDLCALC 107 (H) 07/02/2015   Lab Results  Component Value Date   TRIG 42.0 07/14/2017   TRIG 87.0 07/08/2016   TRIG 91.0 07/02/2015   Lab Results  Component  Value Date   CHOLHDL 2 07/14/2017   CHOLHDL 2 07/08/2016   CHOLHDL 3 07/02/2015   No results found for: LDLDIRECT    Lab Results  Component Value Date   CREATININE 0.96 07/14/2017   BUN 19 07/14/2017   NA 143 07/14/2017   K 4.2 07/14/2017   CL 106 07/14/2017   CO2 27 07/14/2017   Lab Results  Component Value Date   ALT 11 07/14/2017   AST 12 07/14/2017   ALKPHOS 76 07/14/2017   BILITOT 0.3 07/14/2017    Lab Results  Component Value Date   TSH 2.08 07/14/2017    Glucose was 111   Mood is somewhat better  Now new boss is very hard on her  Would like to come off prozac eventually   Patient Active Problem List   Diagnosis Date Noted  . Special screening for malignant neoplasms, colon   . Ulceration of intestine   . Anemia 12/29/2014  . Colon cancer screening 07/08/2014  . Hyperglycemia 07/04/2013  . Routine general medical examination at a health care facility 06/04/2011  . GERD (gastroesophageal reflux disease) 03/10/2011  .  Morbid obesity ([place]) 03/10/2011  . COLONIC POLYPS 04/22/2010  . ANXIETY DEPRESSION 02/28/2008   Past Medical History:  Diagnosis Date  . Depression    with anxious features  . GERD (gastroesophageal reflux disease)   . Hypertension 05/2005   H/O. no meds currently  . Obesity   . Vertigo    last episode 03/18   Past Surgical History:  Procedure Laterality Date  . COLONOSCOPY WITH PROPOFOL N/A 08/27/2016   Procedure: COLONOSCOPY WITH PROPOFOL;  Surgeon: Lucilla Lame, MD;  Location: [facility];  Service: Endoscopy;  Laterality: N/A;  . ENDOMETRIAL ABLATION W/ NOVASURE    . OVARIAN CYST SURGERY  08/2002  . VESICO-VAGINAL FISTULA REPAIR  1994   Social History   Tobacco Use  . Smoking status: Former Smoker    Last attempt to quit: 2014    Years since quitting: 5.3  . Smokeless tobacco: Never Used  . Tobacco comment: quit many years ago.  Substance Use Topics  . Alcohol use: Yes    Alcohol/week: 3.0 oz    Types: 2 Glasses  of wine, 3 Cans of beer per week    Comment:    . Drug use: No   Family History  Problem Relation Age of Onset  . Hypertension Mother        died of GI bleed-unexpected   . Hypertension Father   . Heart disease Father 45       MI  . Alcohol abuse Father   . Heart disease Maternal Grandmother   . Heart disease Paternal Grandmother    No Known Allergies No current outpatient medications on file prior to visit.   No current facility-administered medications on file prior to visit.     Review of Systems  Constitutional: Positive for fatigue. Negative for activity change, appetite change, fever and unexpected weight change.  HENT: Negative for congestion, ear pain, rhinorrhea, sinus pressure and sore throat.   Eyes: Negative for pain, redness and visual disturbance.  Respiratory: Negative for cough, shortness of breath and wheezing.   Cardiovascular: Negative for chest pain and palpitations.  Gastrointestinal: Negative for abdominal pain, anal bleeding, blood in stool, constipation and diarrhea.  Endocrine: Negative for polydipsia and polyuria.  Genitourinary: Negative for dysuria, frequency and urgency.  Musculoskeletal: Negative for arthralgias, back pain and myalgias.  Skin: Negative for pallor and rash.  Allergic/Immunologic: Negative for environmental allergies.  Neurological: Negative for dizziness, syncope and headaches.  Hematological: Negative for adenopathy. Does not bruise/bleed easily.  Psychiatric/Behavioral: Negative for decreased concentration and dysphoric mood. The patient is not nervous/anxious.        Dec motivation due to fatigue        Objective:   Physical Exam  Constitutional: She appears well-developed and well-nourished. No distress.  obese and well appearing   HENT:  Head: Normocephalic and atraumatic.  Right Ear: External ear normal.  Left Ear: External ear normal.  Nose: Nose normal.  Mouth/Throat: Oropharynx is clear and moist.  Eyes: Pupils  are equal, round, and reactive to light. Conjunctivae and EOM are normal. Right eye exhibits no discharge. Left eye exhibits no discharge. No scleral icterus.  Neck: Normal range of motion. Neck supple. No JVD present. Carotid bruit is not present. No thyromegaly present.  Cardiovascular: Normal rate, regular rhythm, normal heart sounds and intact distal pulses. Exam reveals no gallop.  Pulmonary/Chest: Effort normal and breath sounds normal. No respiratory distress. She has no wheezes. She has no rales.  Abdominal: Soft. Bowel sounds are  normal. She exhibits no distension and no mass. There is no tenderness. There is no rebound and no guarding. No hernia.  Musculoskeletal: She exhibits no edema or tenderness.  Lymphadenopathy:    She has no cervical adenopathy.  Neurological: She is alert. She has normal reflexes. No cranial nerve deficit. She exhibits normal muscle tone. Coordination normal.  Skin: Skin is warm and dry. No rash noted. No erythema. No pallor.  Psychiatric: She has a normal mood and affect.  Mood is good  Discussed stressors from work          Assessment & Plan:   Problem List Items Addressed This Visit      Other   Anemia    Hb of 9.0 Not having menses  utd colonoscopy  Will do 3 heme occult cards Then start nu iron 150 mg bid  Re check 4-6 weeks       Relevant Medications   iron polysaccharides (NIFEREX) 150 MG capsule   Other Relevant Orders   Hemoccult Cards (X3 cards)   Hyperglycemia    Lab Results  Component Value Date   HGBA1C 6.2 07/14/2017   disc imp of low glycemic diet and wt loss to prevent DM2       Morbid obesity ([place])    Discussed how this problem influences overall health and the risks it imposes  Reviewed plan for weight loss with lower calorie diet (via better food choices and also portion control or program like weight watchers) and exercise building up to or more than 30 minutes 5 days per week including some aerobic activity           Routine general medical examination at a health care facility - Primary    Reviewed health habits including diet and exercise and skin cancer prevention Reviewed appropriate screening tests for age  Also reviewed health mt list, fam hx and immunization status , as well as social and family history   Labs reviewed Disc plan for diet and exercise  tx anemia  Heme occult cards times 3  Disc shingrix vaccine

## 2017-07-18 NOTE — Assessment & Plan Note (Signed)
Hb of 9.0 Not having menses  utd colonoscopy  Will do 3 heme occult cards Then start nu iron 150 mg bid  Re check 4-6 weeks

## 2017-07-29 ENCOUNTER — Other Ambulatory Visit: Payer: Managed Care, Other (non HMO)

## 2017-07-29 ENCOUNTER — Telehealth: Payer: Self-pay

## 2017-07-29 DIAGNOSIS — D649 Anemia, unspecified: Secondary | ICD-10-CM

## 2017-07-29 LAB — HEMOCCULT SLIDES (X 3 CARDS)
FECAL OCCULT BLD: NEGATIVE
OCCULT 1: NEGATIVE
OCCULT 2: NEGATIVE
OCCULT 3: NEGATIVE
OCCULT 4: NEGATIVE
OCCULT 5: NEGATIVE

## 2017-07-29 MED ORDER — POLYSACCHARIDE IRON COMPLEX 150 MG PO CAPS: 150.0000 mg | ORAL_CAPSULE | Freq: Two times a day (BID) | ORAL | 5 refills | Status: DC

## 2017-07-29 NOTE — Telephone Encounter (Signed)
I re sent it to [facility] for bid dosing  If any problems please let me know

## 2017-07-29 NOTE — Telephone Encounter (Signed)
Pt came by office; pt does not have iron caps yet; Meagan Conway at [employer] said electronic request came to [facility] on 07/18/17 that the recommended dose of Niferex is 150 mg daily; do not see where we received the request.please advise. Pt request cb when done.

## 2017-07-29 NOTE — Telephone Encounter (Signed)
Patient advised.

## 2017-08-03 ENCOUNTER — Encounter (INDEPENDENT_AMBULATORY_CARE_PROVIDER_SITE_OTHER): Payer: Self-pay

## 2017-08-19 ENCOUNTER — Other Ambulatory Visit (INDEPENDENT_AMBULATORY_CARE_PROVIDER_SITE_OTHER): Payer: Managed Care, Other (non HMO)

## 2017-08-19 DIAGNOSIS — D649 Anemia, unspecified: Secondary | ICD-10-CM

## 2017-08-19 NOTE — Addendum Note (Signed)
Addended by: Ellamae Sia on: 08/19/2017 03:25 PM   Modules accepted: Orders

## 2017-08-20 LAB — CBC WITH DIFFERENTIAL/PLATELET
BASOS PCT: 1 %
Basophils Absolute: 94 cells/uL (ref 0–200)
EOS PCT: 1.8 %
Eosinophils Absolute: 169 cells/uL (ref 15–500)
HCT: 27.8 % — ABNORMAL LOW (ref 35.0–45.0)
Hemoglobin: 8.5 g/dL — ABNORMAL LOW (ref 11.7–15.5)
LYMPHS ABS: 2228 {cells}/uL (ref 850–3900)
MCH: 20.3 pg — AB (ref 27.0–33.0)
MCHC: 30.6 g/dL — ABNORMAL LOW (ref 32.0–36.0)
MCV: 66.5 fL — ABNORMAL LOW (ref 80.0–100.0)
MONOS PCT: 6.5 %
MPV: 10.7 fL (ref 7.5–12.5)
NEUTROS ABS: 6298 {cells}/uL (ref 1500–7800)
Neutrophils Relative %: 67 %
PLATELETS: 402 10*3/uL — AB (ref 140–400)
RBC: 4.18 10*6/uL (ref 3.80–5.10)
RDW: 17.2 % — ABNORMAL HIGH (ref 11.0–15.0)
Total Lymphocyte: 23.7 %
WBC mixed population: 611 cells/uL (ref 200–950)
WBC: 9.4 10*3/uL (ref 3.8–10.8)

## 2017-08-20 LAB — FERRITIN: FERRITIN: 4 ng/mL — AB (ref 10–232)

## 2017-08-22 ENCOUNTER — Telehealth: Payer: Self-pay | Admitting: Family Medicine

## 2017-08-22 DIAGNOSIS — D509 Iron deficiency anemia, unspecified: Secondary | ICD-10-CM

## 2017-08-22 NOTE — Telephone Encounter (Signed)
Ref done  Will route to [facility] 

## 2017-08-22 NOTE — Telephone Encounter (Signed)
-----   Message from Tammi Sou, [place] sent at 08/22/2017 12:42 PM EDT ----- Pt notified of lab results and Dr. Marliss Coots comments. Pt would like [place] to pt a referral in to see GI (thinks we may be able to get her in sooner), please put referral in and I advise pt our [facility] will call to schedule appt

## 2017-08-23 ENCOUNTER — Other Ambulatory Visit: Payer: Self-pay

## 2017-08-23 ENCOUNTER — Encounter: Payer: Self-pay | Admitting: Gastroenterology

## 2017-08-23 ENCOUNTER — Ambulatory Visit (INDEPENDENT_AMBULATORY_CARE_PROVIDER_SITE_OTHER): Payer: Managed Care, Other (non HMO) | Admitting: Gastroenterology

## 2017-08-23 VITALS — BP 117/74 | HR 83 | Ht 64.5 in | Wt 249.8 lb

## 2017-08-23 DIAGNOSIS — D509 Iron deficiency anemia, unspecified: Secondary | ICD-10-CM

## 2017-08-23 DIAGNOSIS — D500 Iron deficiency anemia secondary to blood loss (chronic): Secondary | ICD-10-CM

## 2017-08-23 NOTE — Telephone Encounter (Signed)
Patient being seen today by Dr Allen Norris.

## 2017-08-23 NOTE — Progress Notes (Signed)
    Primary Care Physician: Tower, Wynelle Fanny, MD  Primary Gastroenterologist:  Dr. Lucilla Lame  Chief Complaint  Patient presents with  . Anemia    HPI: Meagan Conway is a 57 y.o. female here for anemia. The patient has a history of having a colonoscopy last year with no source of anemia found.  The patient was started on iron a few weeks ago and her ferritin has remained low.  The patient also did Hemoccult cards that were negative 6 as per the patient states.  There is no report of any seeing of bright red blood per rectum hematuria nausea vomiting hematemesis or any other sign of blood loss.  She denies any unexplained weight loss or abdominal pain.  Current Outpatient Medications  Medication Sig Dispense Refill  . FLUoxetine (PROZAC) 20 MG capsule Take 1 capsule (20 mg total) by mouth daily. 90 capsule 3  . iron polysaccharides (NIFEREX) 150 MG capsule Take 1 capsule (150 mg total) by mouth 2 (two) times daily. 60 capsule 5  . pantoprazole (PROTONIX) 40 MG tablet Take 1 tablet (40 mg total) by mouth daily. 90 tablet 3   No current facility-administered medications for this visit.     Allergies as of 08/23/2017  . (No Known Allergies)    ROS:  General: Negative for anorexia, weight loss, fever, chills, fatigue, weakness. ENT: Negative for hoarseness, difficulty swallowing , nasal congestion. CV: Negative for chest pain, angina, palpitations, dyspnea on exertion, peripheral edema.  Respiratory: Negative for dyspnea at rest, dyspnea on exertion, cough, sputum, wheezing.  GI: See history of present illness. GU:  Negative for dysuria, hematuria, urinary incontinence, urinary frequency, nocturnal urination.  Endo: Negative for unusual weight change.    Physical Examination:   BP 117/74   Pulse 83   Ht 5' 4.5" (1.638 m)   Wt 249 lb 12.8 oz (113.3 kg)   BMI 42.22 kg/m   General: Well-nourished, well-developed in no acute distress.  Eyes: No icterus. Conjunctivae  pink. Mouth: Oropharyngeal mucosa moist and pink , no lesions erythema or exudate. Lungs: Clear to auscultation bilaterally. Non-labored. Heart: Regular rate and rhythm, no murmurs rubs or gallops.  Abdomen: Bowel sounds are normal, nontender, nondistended, no hepatosplenomegaly or masses, no abdominal bruits or hernia , no rebound or guarding.   Extremities: No lower extremity edema. No clubbing or deformities. Neuro: Alert and oriented x 3.  Grossly intact. Skin: Warm and dry, no jaundice.   Psych: Alert and cooperative, normal mood and affect.  Labs:    Imaging Studies: No results found.  Assessment and Plan:   Meagan Conway is a 57 y.o. y/o female who was found to have a low ferritin and anemia.  The patient has been on iron without any correction of her anemia. The patient has been taking oral iron.  The patient has had negative Hemoccult 6 times.  She also reports that they were not from the same stool sample.  The patient will be set up for an EGD and possible capsule endoscopy if the EGD is negative.  She has been told that if this is negative she may need IV iron because this may be a absorption of iron problem.  The patient has been explained the plan and agrees with it.    Lucilla Lame, MD. [name]   Note: This dictation was prepared with Dragon dictation along with smaller phrase technology. Any transcriptional errors that result from this process are unintentional.

## 2017-08-25 ENCOUNTER — Ambulatory Visit: Payer: Managed Care, Other (non HMO) | Admitting: Gastroenterology

## 2017-08-26 NOTE — Discharge Instructions (Signed)
General Anesthesia, Adult, Care After °These instructions provide you with information about caring for yourself after your procedure. Your health care provider may also give you more specific instructions. Your treatment has been planned according to current medical practices, but problems sometimes occur. Call your health care provider if you have any problems or questions after your procedure. °What can I expect after the procedure? °After the procedure, it is common to have: °· Vomiting. °· A sore throat. °· Mental slowness. ° °It is common to feel: °· Nauseous. °· Cold or shivery. °· Sleepy. °· Tired. °· Sore or achy, even in parts of your body where you did not have surgery. ° °Follow these instructions at home: °For at least 24 hours after the procedure: °· Do not: °? Participate in activities where you could fall or become injured. °? Drive. °? Use heavy machinery. °? Drink alcohol. °? Take sleeping pills or medicines that cause drowsiness. °? Make important decisions or sign legal documents. °? Take care of children on your own. °· Rest. °Eating and drinking °· If you vomit, drink water, juice, or soup when you can drink without vomiting. °· Drink enough fluid to keep your urine clear or pale yellow. °· Make sure you have little or no nausea before eating solid foods. °· Follow the diet recommended by your health care provider. °General instructions °· Have a responsible adult stay with you until you are awake and alert. °· Return to your normal activities as told by your health care provider. Ask your health care provider what activities are safe for you. °· Take over-the-counter and prescription medicines only as told by your health care provider. °· If you smoke, do not smoke without supervision. °· Keep all follow-up visits as told by your health care provider. This is important. °Contact a health care provider if: °· You continue to have nausea or vomiting at home, and medicines are not helpful. °· You  cannot drink fluids or start eating again. °· You cannot urinate after 8-12 hours. °· You develop a skin rash. °· You have fever. °· You have increasing redness at the site of your procedure. °Get help right away if: °· You have difficulty breathing. °· You have chest pain. °· You have unexpected bleeding. °· You feel that you are having a life-threatening or urgent problem. °This information is not intended to replace advice given to you by your health care provider. Make sure you discuss any questions you have with your health care provider. °Document Released: 06/07/2000 Document Revised: 08/04/2015 Document Reviewed: 02/13/2015 °Elsevier Interactive Patient Education © 2018 [employer] ° °

## 2017-08-29 ENCOUNTER — Ambulatory Visit: Payer: Managed Care, Other (non HMO) | Admitting: Anesthesiology

## 2017-08-29 ENCOUNTER — Encounter
Admission: RE | Disposition: A | Discharge status: Home / Self Care | Payer: Self-pay | Source: Ambulatory Visit | Attending: Gastroenterology

## 2017-08-29 ENCOUNTER — Ambulatory Visit
Admission: RE | Admit: 2017-08-29 | Discharge: 2017-08-29 | Disposition: A | Discharge status: Home / Self Care | Payer: Managed Care, Other (non HMO) | Source: Ambulatory Visit | Attending: Gastroenterology | Admitting: Gastroenterology

## 2017-08-29 DIAGNOSIS — Z87891 Personal history of nicotine dependence: Secondary | ICD-10-CM | POA: Insufficient documentation

## 2017-08-29 DIAGNOSIS — D509 Iron deficiency anemia, unspecified: Secondary | ICD-10-CM | POA: Diagnosis not present

## 2017-08-29 DIAGNOSIS — Z79899 Other long term (current) drug therapy: Secondary | ICD-10-CM | POA: Diagnosis not present

## 2017-08-29 DIAGNOSIS — D500 Iron deficiency anemia secondary to blood loss (chronic): Secondary | ICD-10-CM

## 2017-08-29 DIAGNOSIS — Z6841 Body Mass Index (BMI) 40.0 and over, adult: Secondary | ICD-10-CM | POA: Insufficient documentation

## 2017-08-29 DIAGNOSIS — Z8249 Family history of ischemic heart disease and other diseases of the circulatory system: Secondary | ICD-10-CM | POA: Diagnosis not present

## 2017-08-29 DIAGNOSIS — K219 Gastro-esophageal reflux disease without esophagitis: Secondary | ICD-10-CM | POA: Diagnosis not present

## 2017-08-29 DIAGNOSIS — E669 Obesity, unspecified: Secondary | ICD-10-CM | POA: Diagnosis not present

## 2017-08-29 DIAGNOSIS — K449 Diaphragmatic hernia without obstruction or gangrene: Secondary | ICD-10-CM | POA: Insufficient documentation

## 2017-08-29 DIAGNOSIS — F419 Anxiety disorder, unspecified: Secondary | ICD-10-CM | POA: Insufficient documentation

## 2017-08-29 DIAGNOSIS — F329 Major depressive disorder, single episode, unspecified: Secondary | ICD-10-CM | POA: Diagnosis not present

## 2017-08-29 HISTORY — PX: ESOPHAGOGASTRODUODENOSCOPY (EGD) WITH PROPOFOL: SHX5813

## 2017-08-29 SURGERY — ESOPHAGOGASTRODUODENOSCOPY (EGD) WITH PROPOFOL
Anesthesia: General | Site: Throat | Wound class: Clean Contaminated

## 2017-08-29 MED ORDER — ONDANSETRON HCL 4 MG/2ML IJ SOLN: 4.0000 mg | Freq: Once | INTRAMUSCULAR | Status: DC | PRN

## 2017-08-29 MED ORDER — GLYCOPYRROLATE 0.2 MG/ML IJ SOLN
INTRAMUSCULAR | Status: DC | PRN
  Administered 2017-08-29: 0.2 mg via INTRAVENOUS

## 2017-08-29 MED ORDER — LACTATED RINGERS IV SOLN
10.0000 mL/h | INTRAVENOUS | Status: DC
  Administered 2017-08-29 (×2): via INTRAVENOUS
  Administered 2017-08-29: 10 mL/h via INTRAVENOUS

## 2017-08-29 MED ORDER — LIDOCAINE HCL (CARDIAC) PF 100 MG/5ML IV SOSY
PREFILLED_SYRINGE | INTRAVENOUS | Status: DC | PRN
  Administered 2017-08-29: 100 mg via INTRAVENOUS

## 2017-08-29 MED ORDER — PROPOFOL 10 MG/ML IV BOLUS
INTRAVENOUS | Status: DC | PRN
  Administered 2017-08-29: 80 mg via INTRAVENOUS
  Administered 2017-08-29: 30 mg via INTRAVENOUS
  Administered 2017-08-29: 20 mg via INTRAVENOUS
  Administered 2017-08-29: 40 mg via INTRAVENOUS
  Administered 2017-08-29: 30 mg via INTRAVENOUS

## 2017-08-29 MED ORDER — SODIUM CHLORIDE 0.9 % IV SOLN: INTRAVENOUS | Status: DC

## 2017-08-29 MED ORDER — SIMETHICONE 40 MG/0.6ML PO SUSP
ORAL | Status: DC | PRN
  Administered 2017-08-29: .5 mL

## 2017-08-29 SURGICAL SUPPLY — 7 items
BLOCK BITE 60FR ADLT L/F GRN (MISCELLANEOUS) ×2 IMPLANT
CANISTER SUCT 1200ML W/VALVE (MISCELLANEOUS) ×2 IMPLANT
FORCEPS BIOP RAD 4 LRG CAP 4 (CUTTING FORCEPS) ×1 IMPLANT
GOWN CVR UNV OPN BCK APRN NK (MISCELLANEOUS) ×2 IMPLANT
GOWN ISOL THUMB LOOP REG UNIV (MISCELLANEOUS) ×4
KIT ENDO PROCEDURE OLY (KITS) ×2 IMPLANT
WATER STERILE IRR 250ML POUR (IV SOLUTION) ×2 IMPLANT

## 2017-08-29 NOTE — Anesthesia Procedure Notes (Signed)
Procedure Name: MAC Date/Time: 08/29/2017 10:40 AM Performed by: Lind Guest, CRNA Pre-anesthesia Checklist: Patient identified, Emergency Drugs available, Suction available, Patient being monitored and Timeout performed Patient Re-evaluated:Patient Re-evaluated prior to induction Oxygen Delivery Method: Nasal cannula

## 2017-08-29 NOTE — Anesthesia Preprocedure Evaluation (Signed)
Anesthesia Evaluation  Patient identified by MRN, date of birth, ID band Patient awake    Reviewed: Allergy & Precautions, NPO status , Patient's Chart, lab work & pertinent test results  History of Anesthesia Complications Negative for: history of anesthetic complications  Airway Mallampati: I  TM Distance: >3 FB Neck ROM: Full    Dental no notable dental hx.    Pulmonary former smoker (quit 2014),    Pulmonary exam normal breath sounds clear to auscultation       Cardiovascular hypertension, Normal cardiovascular exam Rhythm:Regular Rate:Normal     Neuro/Psych Vertigo     GI/Hepatic GERD  ,  Endo/Other  Obesity   Renal/GU negative Renal ROS     Musculoskeletal   Abdominal   Peds  Hematology  (+) Blood dyscrasia, anemia ,   Anesthesia Other Findings   Reproductive/Obstetrics                             Anesthesia Physical Anesthesia Plan  ASA: II  Anesthesia Plan: General   Post-op Pain Management:    Induction: Intravenous  PONV Risk Score and Plan: 3  Airway Management Planned: Natural Airway  Additional Equipment:   Intra-op Plan:   Post-operative Plan:   Informed Consent: I have reviewed the patients History and Physical, chart, labs and discussed the procedure including the risks, benefits and alternatives for the proposed anesthesia with the patient or authorized representative who has indicated his/her understanding and acceptance.     Plan Discussed with: CRNA  Anesthesia Plan Comments:         Anesthesia Quick Evaluation

## 2017-08-29 NOTE — Op Note (Signed)
[facility] Gastroenterology Patient Name: Meagan Conway Procedure Date: 08/29/2017 10:35 AM MRN: [id number] Account #: [id number] Date of Birth: 03-12-1961 Admit Type: Outpatient Age: 57 Room: [facility] OR ROOM 01 Gender: Female Note Status: Finalized Procedure:            Upper GI endoscopy Indications:          Iron deficiency anemia Providers:            Lucilla Lame MD, MD Referring MD:         Wynelle Fanny. Tower (Referring MD) Medicines:            Propofol per Anesthesia Complications:        No immediate complications. Procedure:            Pre-Anesthesia Assessment:                       - Prior to the procedure, a History and Physical was                        performed, and patient medications and allergies were                        reviewed. The patient's tolerance of previous                        anesthesia was also reviewed. The risks and benefits of                        the procedure and the sedation options and risks were                        discussed with the patient. All questions were                        answered, and informed consent was obtained. Prior                        Anticoagulants: The patient has taken no previous                        anticoagulant or antiplatelet agents. ASA Grade                        Assessment: II - A patient with mild systemic disease.                        After reviewing the risks and benefits, the patient was                        deemed in satisfactory condition to undergo the                        procedure.                       After obtaining informed consent, the endoscope was                        passed under direct vision. Throughout the procedure,  the patient's blood pressure, pulse, and oxygen                        saturations were monitored continuously. The was                        introduced through the mouth, and advanced to the   second part of duodenum. The upper GI endoscopy was                        accomplished without difficulty. The patient tolerated                        the procedure well. Findings:      A large hiatal hernia was present.      The stomach was normal.      The examined duodenum was normal. Biopsies were taken with a cold       forceps for histology. Impression:           - Large hiatal hernia.                       - Normal stomach.                       - Normal examined duodenum. Biopsied. Recommendation:       - Await pathology results.                       - Discharge patient to home.                       - Resume previous diet.                       - Continue present medications.                       - Await pathology results.                       - To visualize the small bowel, perform video capsule                        endoscopy. Procedure Code(s):    --- Professional ---                       [phone], Esophagogastroduodenoscopy, flexible, transoral;                        with biopsy, single or multiple Diagnosis Code(s):    --- Professional ---                       D50.9, Iron deficiency anemia, unspecified CPT copyright 2017 American Medical Association. All rights reserved. The codes documented in this report are preliminary and upon coder review may  be revised to meet current compliance requirements. Lucilla Lame MD, MD 08/29/2017 10:49:05 AM This report has been signed electronically. Number of Addenda: 0 Note Initiated On: 08/29/2017 10:35 AM      [facility]

## 2017-08-29 NOTE — Transfer of Care (Signed)
Immediate Anesthesia Transfer of Care Note  Patient: Meagan Conway  Procedure(s) Performed: ESOPHAGOGASTRODUODENOSCOPY (EGD) WITH PROPOFOL (N/A Throat)  Patient Location: PACU  Anesthesia Type: General  Level of Consciousness: awake, alert  and patient cooperative  Airway and Oxygen Therapy: Patient Spontanous Breathing and Patient connected to supplemental oxygen  Post-op Assessment: Post-op Vital signs reviewed, Patient's Cardiovascular Status Stable, Respiratory Function Stable, Patent Airway and No signs of Nausea or vomiting  Post-op Vital Signs: Reviewed and stable  Complications: No apparent anesthesia complications

## 2017-08-29 NOTE — Anesthesia Postprocedure Evaluation (Signed)
Anesthesia Post Note  Patient: Meagan Conway  Procedure(s) Performed: ESOPHAGOGASTRODUODENOSCOPY (EGD) WITH PROPOFOL (N/A Throat)  Patient location during evaluation: PACU Anesthesia Type: General Level of consciousness: awake and alert, oriented and patient cooperative Pain management: pain level controlled Vital Signs Assessment: post-procedure vital signs reviewed and stable Respiratory status: spontaneous breathing, nonlabored ventilation and respiratory function stable Cardiovascular status: blood pressure returned to baseline and stable Postop Assessment: adequate PO intake Anesthetic complications: no    Darrin Nipper

## 2017-08-29 NOTE — H&P (Signed)
Meagan Lame, MD [facility] [address], [place], [place] [address] [phone] Fax : [phone]  Primary Care Physician:  Tower, Wynelle Fanny, MD Primary Gastroenterologist:  Dr. Allen Norris  Pre-Procedure History & Physical: HPI:  Meagan Conway is a 57 y.o. female is here for an endoscopy.   Past Medical History:  Diagnosis Date  . Depression    with anxious features  . GERD (gastroesophageal reflux disease)   . Hypertension 05/2005   H/O. no meds currently  . Obesity   . Vertigo    last episode 03/18    Past Surgical History:  Procedure Laterality Date  . COLONOSCOPY WITH PROPOFOL N/A 08/27/2016   Procedure: COLONOSCOPY WITH PROPOFOL;  Surgeon: Meagan Lame, MD;  Location: [facility];  Service: Endoscopy;  Laterality: N/A;  . ENDOMETRIAL ABLATION W/ NOVASURE    . OVARIAN CYST SURGERY  08/2002  . [place]    Prior to Admission medications   Medication Sig Start Date End Date Taking? Authorizing Provider  FLUoxetine (PROZAC) 20 MG capsule Take 1 capsule (20 mg total) by mouth daily. 07/18/17  Yes Tower, Wynelle Fanny, MD  iron polysaccharides (NIFEREX) 150 MG capsule Take 1 capsule (150 mg total) by mouth 2 (two) times daily. 07/29/17  Yes Tower, Wynelle Fanny, MD  pantoprazole (PROTONIX) 40 MG tablet Take 1 tablet (40 mg total) by mouth daily. 07/18/17  Yes Tower, Wynelle Fanny, MD    Allergies as of 08/23/2017  . (No Known Allergies)    Family History  Problem Relation Age of Onset  . Hypertension Mother        died of GI bleed-unexpected   . Hypertension Father   . Heart disease Father 33       MI  . Alcohol abuse Father   . Heart disease Maternal Grandmother   . Heart disease Paternal Grandmother     Social History   Socioeconomic History  . Marital status: Married    Spouse name: Not on file  . Number of children: Not on file  . Years of education: Not on file  . Highest education level: Not on file  Occupational History  . Not  on file  Social Needs  . Financial resource strain: Not on file  . Food insecurity:    Worry: Not on file    Inability: Not on file  . Transportation needs:    Medical: Not on file    Non-medical: Not on file  Tobacco Use  . Smoking status: Former Smoker    Last attempt to quit: 2014    Years since quitting: 5.4  . Smokeless tobacco: Never Used  . Tobacco comment: quit many years ago.  Substance and Sexual Activity  . Alcohol use: Yes    Alcohol/week: 3.0 oz    Types: 2 Glasses of wine, 3 Cans of beer per week    Comment:    . Drug use: No  . Sexual activity: Yes    Partners: Male    Birth control/protection: None    Comment: husband has a vasectomy  Lifestyle  . Physical activity:    Days per week: Not on file    Minutes per session: Not on file  . Stress: Not on file  Relationships  . Social connections:    Talks on phone: Not on file    Gets together: Not on file    Attends religious service: Not on file    Active member of club or organization:  Not on file    Attends meetings of clubs or organizations: Not on file    Relationship status: Not on file  . Intimate partner violence:    Fear of current or ex partner: Not on file    Emotionally abused: Not on file    Physically abused: Not on file    Forced sexual activity: Not on file  Other Topics Concern  . Not on file  Social History Narrative  . Not on file    Review of Systems: See HPI, otherwise negative ROS  Physical Exam: BP 129/81   Pulse 64   Temp 98.1 F (36.7 C) (Temporal)   Resp 16   Ht 5\' 4"  (1.626 m)   Wt 247 lb (112 kg)   SpO2 96%   BMI 42.40 kg/m  General:   Alert,  pleasant and cooperative in NAD Head:  Normocephalic and atraumatic. Neck:  Supple; no masses or thyromegaly. Lungs:  Clear throughout to auscultation.    Heart:  Regular rate and rhythm. Abdomen:  Soft, nontender and nondistended. Normal bowel sounds, without guarding, and without rebound.   Neurologic:  Alert and   oriented x4;  grossly normal neurologically.  Impression/Plan: Meagan Conway is here for an endoscopy to be performed for IDA  Risks, benefits, limitations, and alternatives regarding  endoscopy have been reviewed with the patient.  Questions have been answered.  All parties agreeable.   Meagan Lame, MD  08/29/2017, 10:07 AM

## 2017-08-30 ENCOUNTER — Encounter: Payer: Self-pay | Admitting: Gastroenterology

## 2017-09-07 ENCOUNTER — Telehealth: Payer: Self-pay | Admitting: Gastroenterology

## 2017-09-07 NOTE — Telephone Encounter (Signed)
PT LEFT VM SHE HAS A QUESTION ABOUT A procedure she had 6/17

## 2017-09-08 ENCOUNTER — Other Ambulatory Visit: Payer: Self-pay

## 2017-09-08 DIAGNOSIS — D509 Iron deficiency anemia, unspecified: Secondary | ICD-10-CM

## 2017-09-08 DIAGNOSIS — D500 Iron deficiency anemia secondary to blood loss (chronic): Secondary | ICD-10-CM

## 2017-09-08 NOTE — Telephone Encounter (Signed)
Video Capsule Endoscopy has been ordered for patient per EGD report on 08/29/17.  Patient instructions have been sent via mychart.  Notified patient by phone of her date 09/22/17 and instructions sent via mychart,  to contact Endoscopy Dept day before between 1-3 for arrival time.  If she has any questions regarding instructions to call us back.  Thanks [employer]

## 2017-09-14 ENCOUNTER — Ambulatory Visit: Payer: Managed Care, Other (non HMO) | Admitting: Obstetrics & Gynecology

## 2017-09-23 ENCOUNTER — Ambulatory Visit: Payer: Managed Care, Other (non HMO) | Admitting: Obstetrics & Gynecology

## 2017-09-23 ENCOUNTER — Other Ambulatory Visit: Payer: Self-pay

## 2017-09-23 ENCOUNTER — Ambulatory Visit (INDEPENDENT_AMBULATORY_CARE_PROVIDER_SITE_OTHER): Payer: Managed Care, Other (non HMO) | Admitting: Obstetrics & Gynecology

## 2017-09-23 ENCOUNTER — Encounter: Payer: Self-pay | Admitting: Obstetrics & Gynecology

## 2017-09-23 VITALS — BP 139/88 | HR 64 | Ht 64.0 in | Wt 252.0 lb

## 2017-09-23 DIAGNOSIS — Z01419 Encounter for gynecological examination (general) (routine) without abnormal findings: Secondary | ICD-10-CM | POA: Diagnosis not present

## 2017-09-23 DIAGNOSIS — Z1151 Encounter for screening for human papillomavirus (HPV): Secondary | ICD-10-CM

## 2017-09-23 DIAGNOSIS — Z124 Encounter for screening for malignant neoplasm of cervix: Secondary | ICD-10-CM | POA: Diagnosis not present

## 2017-09-23 NOTE — Progress Notes (Signed)
Subjective:    Meagan Conway is a 57 y.o. married P2 (3 grands)  female who presents for an annual exam. The patient has no complaints today. The patient is sexually active. GYN screening history: last pap: was normal. The patient wears seatbelts: yes. The patient participates in regular exercise: no. Has the patient ever been transfused or tattooed?: yes. (eyeliner) The patient reports that there is not domestic violence in her life.   Menstrual History: OB History    Gravida  2   Para  2   Term  2   Preterm      AB      Living  2     SAB      TAB      Ectopic      Multiple      Live Births  2           Menarche age: 78 No LMP recorded. Patient has had an ablation.    The following portions of the patient's history were reviewed and updated as appropriate: allergies, current medications, past family history, past medical history, past social history, past surgical history and problem list.  Review of Systems Pertinent items are noted in HPI.   Dr. Glori Bickers does her blood work She works for [employer] in [employer] or 15 years, denies dyspareunia Colonoscopy UTD    Objective:    BP 139/88   Pulse 64   Ht 5\' 4"  (1.626 m)   Wt 252 lb (114.3 kg)   BMI 43.26 kg/m   General Appearance:    Alert, cooperative, no distress, appears stated age  Head:    Normocephalic, without obvious abnormality, atraumatic  Eyes:    PERRL, conjunctiva/corneas clear, EOM's intact, fundi    benign, both eyes  Ears:    Normal TM's and external ear canals, both ears  Nose:   Nares normal, septum midline, mucosa normal, no drainage    or sinus tenderness  Throat:   Lips, mucosa, and tongue normal; teeth and gums normal  Neck:   Supple, symmetrical, trachea midline, no adenopathy;    thyroid:  no enlargement/tenderness/nodules; no carotid   bruit or JVD  Back:     Symmetric, no curvature, ROM normal, no CVA tenderness  Lungs:     Clear to auscultation bilaterally,  respirations unlabored  Chest Wall:    No tenderness or deformity   Heart:    Regular rate and rhythm, S1 and S2 normal, no murmur, rub   or gallop  Breast Exam:    No tenderness, masses, or nipple abnormality  Abdomen:     Soft, non-tender, bowel sounds active all four quadrants,    no masses, no organomegaly  Genitalia:    Normal female without lesion, discharge or tenderness, normal size and shape, anteverted, mobile, non-tender, normal adnexal exam      Extremities:   Extremities normal, atraumatic, no cyanosis or edema  Pulses:   2+ and symmetric all extremities  Skin:   Skin color, texture, turgor normal, no rashes or lesions  Lymph nodes:   Cervical, supraclavicular, and axillary nodes normal  Neurologic:   CNII-XII intact, normal strength, sensation and reflexes    throughout  .    Assessment:    Healthy female exam.    Plan:     Mammogram. Thin prep Pap smear. with cotesting

## 2017-09-23 NOTE — Addendum Note (Signed)
Addended by: Tamela Oddi on: 09/23/2017 11:32 AM   Modules accepted: Orders

## 2017-09-23 NOTE — Progress Notes (Signed)
AEX/PAP.  Last Mammo 09/2016, last PAP 08/2016.  Reports no problems today.

## 2017-09-26 LAB — CYTOLOGY - PAP
DIAGNOSIS: NEGATIVE
HPV: NOT DETECTED

## 2017-09-26 LAB — HM MAMMOGRAPHY: HM Mammogram: NORMAL (ref 0–4)

## 2017-09-28 ENCOUNTER — Encounter
Admission: RE | Disposition: A | Discharge status: Home / Self Care | Payer: Self-pay | Source: Ambulatory Visit | Attending: Gastroenterology

## 2017-09-28 ENCOUNTER — Ambulatory Visit
Admission: RE | Admit: 2017-09-28 | Discharge: 2017-09-28 | Disposition: A | Discharge status: Home / Self Care | Payer: Managed Care, Other (non HMO) | Source: Ambulatory Visit | Attending: Gastroenterology | Admitting: Gastroenterology

## 2017-09-28 DIAGNOSIS — D509 Iron deficiency anemia, unspecified: Secondary | ICD-10-CM | POA: Insufficient documentation

## 2017-09-28 HISTORY — PX: GIVENS CAPSULE STUDY: SHX5432

## 2017-09-28 SURGERY — IMAGING PROCEDURE, GI TRACT, INTRALUMINAL, VIA CAPSULE
Anesthesia: General

## 2017-09-29 ENCOUNTER — Encounter: Payer: Self-pay | Admitting: Gastroenterology

## 2017-10-04 ENCOUNTER — Encounter: Payer: Self-pay | Admitting: Gastroenterology

## 2017-10-06 ENCOUNTER — Telehealth: Payer: Self-pay | Admitting: Gastroenterology

## 2017-10-06 NOTE — Telephone Encounter (Signed)
Pt is calling for results on her procedure

## 2017-10-07 NOTE — Telephone Encounter (Signed)
The iron should bring her blood count back to normal in a few weeks so she should check it again in 1 month.

## 2017-10-07 NOTE — Telephone Encounter (Signed)
Pt is calling for test results °

## 2017-10-07 NOTE — Telephone Encounter (Signed)
Pt notified of capsule study results. Per Dr. Georgeann Oppenheim recommendation of taking iron first, how long should she take this before we recheck labs? See Capsule study report for his recommendation.

## 2017-10-10 ENCOUNTER — Other Ambulatory Visit: Payer: Self-pay

## 2017-10-10 DIAGNOSIS — D500 Iron deficiency anemia secondary to blood loss (chronic): Secondary | ICD-10-CM

## 2017-10-10 NOTE — Telephone Encounter (Signed)
Pt notified of recommendation. Order placed. Pt will go in a few weeks to recheck levels.

## 2017-11-21 ENCOUNTER — Encounter: Payer: Self-pay | Admitting: Family Medicine

## 2017-11-24 ENCOUNTER — Other Ambulatory Visit
Admission: RE | Admit: 2017-11-24 | Discharge: 2017-11-24 | Disposition: A | Discharge status: Home / Self Care | Payer: Managed Care, Other (non HMO) | Source: Ambulatory Visit | Attending: Gastroenterology | Admitting: Gastroenterology

## 2017-11-24 DIAGNOSIS — D500 Iron deficiency anemia secondary to blood loss (chronic): Secondary | ICD-10-CM

## 2017-11-24 LAB — CBC WITH DIFFERENTIAL/PLATELET
BASOS PCT: 1 %
Basophils Absolute: 0.1 10*3/uL (ref 0–0.1)
Eosinophils Absolute: 0.2 10*3/uL (ref 0–0.7)
Eosinophils Relative: 2 %
HEMATOCRIT: 27.8 % — AB (ref 35.0–47.0)
Hemoglobin: 8.9 g/dL — ABNORMAL LOW (ref 12.0–16.0)
LYMPHS ABS: 2.2 10*3/uL (ref 1.0–3.6)
Lymphocytes Relative: 24 %
MCH: 20.7 pg — ABNORMAL LOW (ref 26.0–34.0)
MCHC: 31.9 g/dL — AB (ref 32.0–36.0)
MCV: 65 fL — ABNORMAL LOW (ref 80.0–100.0)
MONOS PCT: 6 %
Monocytes Absolute: 0.5 10*3/uL (ref 0.2–0.9)
NEUTROS ABS: 6.3 10*3/uL (ref 1.4–6.5)
NEUTROS PCT: 67 %
Platelets: 373 10*3/uL (ref 150–440)
RBC: 4.28 MIL/uL (ref 3.80–5.20)
RDW: 19.2 % — ABNORMAL HIGH (ref 11.5–14.5)
WBC: 9.3 10*3/uL (ref 3.6–11.0)

## 2017-12-08 ENCOUNTER — Telehealth: Payer: Self-pay | Admitting: Gastroenterology

## 2017-12-08 NOTE — Telephone Encounter (Signed)
Pt would like a call from Ginger regarding a procedure she is having next week at [facility]

## 2017-12-08 NOTE — Telephone Encounter (Signed)
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 She stated the doctor told her he may not be able to reach the area of concern and if not, she will just have to take iron to keep her levels up. She is wondering what your thoughts are on this. She doesn't want to get a huge bill for something they may not be able to do.

## 2017-12-09 ENCOUNTER — Telehealth: Payer: Self-pay | Admitting: Gastroenterology

## 2017-12-09 NOTE — Telephone Encounter (Signed)
Pt is calling for Meagan Conway °

## 2017-12-09 NOTE — Telephone Encounter (Signed)
I dont do these procedures and they are usually able to get most spots in the small bowel. It is up to her if she wants to take the chance it does not get to that place. Sorry.

## 2017-12-12 NOTE — Telephone Encounter (Signed)
Left vm informing pt of Dr. Wohl's recommendation.  

## 2017-12-15 HISTORY — PX: SMALL BOWEL ENTEROSCOPY: SHX2415

## 2017-12-16 ENCOUNTER — Encounter: Payer: Self-pay | Admitting: Gastroenterology

## 2018-07-17 ENCOUNTER — Telehealth: Payer: Self-pay | Admitting: Family Medicine

## 2018-07-17 DIAGNOSIS — R7303 Prediabetes: Secondary | ICD-10-CM

## 2018-07-17 DIAGNOSIS — Z0000 Encounter for general adult medical examination without abnormal findings: Secondary | ICD-10-CM

## 2018-07-17 NOTE — Telephone Encounter (Signed)
-----   Message from Ellamae Sia sent at 07/12/2018 11:46 AM EDT ----- Regarding: Lab orders for Wednesday, 5.6.20 Patient is scheduled for CPX labs, please order future labs, Thanks , Karna Christmas

## 2018-07-19 ENCOUNTER — Other Ambulatory Visit: Payer: Self-pay

## 2018-07-19 ENCOUNTER — Other Ambulatory Visit: Payer: Managed Care, Other (non HMO)

## 2018-07-19 ENCOUNTER — Other Ambulatory Visit (INDEPENDENT_AMBULATORY_CARE_PROVIDER_SITE_OTHER): Payer: BLUE CROSS/BLUE SHIELD

## 2018-07-19 DIAGNOSIS — Z0000 Encounter for general adult medical examination without abnormal findings: Secondary | ICD-10-CM | POA: Diagnosis not present

## 2018-07-19 DIAGNOSIS — R7303 Prediabetes: Secondary | ICD-10-CM | POA: Diagnosis not present

## 2018-07-19 LAB — CBC WITH DIFFERENTIAL/PLATELET
Basophils Absolute: 0.1 10*3/uL (ref 0.0–0.1)
Basophils Relative: 1.8 % (ref 0.0–3.0)
Eosinophils Absolute: 0.2 10*3/uL (ref 0.0–0.7)
Eosinophils Relative: 2.9 % (ref 0.0–5.0)
HCT: 31.7 % — ABNORMAL LOW (ref 36.0–46.0)
Hemoglobin: 9.8 g/dL — ABNORMAL LOW (ref 12.0–15.0)
Lymphocytes Relative: 25.4 % (ref 12.0–46.0)
Lymphs Abs: 1.7 10*3/uL (ref 0.7–4.0)
MCHC: 31.1 g/dL (ref 30.0–36.0)
MCV: 67.4 fl — ABNORMAL LOW (ref 78.0–100.0)
Monocytes Absolute: 0.3 10*3/uL (ref 0.1–1.0)
Monocytes Relative: 4.8 % (ref 3.0–12.0)
Neutro Abs: 4.4 10*3/uL (ref 1.4–7.7)
Neutrophils Relative %: 65.1 % (ref 43.0–77.0)
Platelets: 364 10*3/uL (ref 150.0–400.0)
RBC: 4.7 Mil/uL (ref 3.87–5.11)
RDW: 19 % — ABNORMAL HIGH (ref 11.5–15.5)
WBC: 6.8 10*3/uL (ref 4.0–10.5)

## 2018-07-19 LAB — COMPREHENSIVE METABOLIC PANEL
ALT: 14 U/L (ref 0–35)
AST: 15 U/L (ref 0–37)
Albumin: 4.1 g/dL (ref 3.5–5.2)
Alkaline Phosphatase: 76 U/L (ref 39–117)
BUN: 17 mg/dL (ref 6–23)
CO2: 26 mEq/L (ref 19–32)
Calcium: 8.9 mg/dL (ref 8.4–10.5)
Chloride: 105 mEq/L (ref 96–112)
Creatinine, Ser: 0.95 mg/dL (ref 0.40–1.20)
GFR: 60.97 mL/min (ref >60.00)
Glucose, Bld: 109 mg/dL — ABNORMAL HIGH (ref 70–99)
Potassium: 4.3 mEq/L (ref 3.5–5.1)
Sodium: 140 mEq/L (ref 135–145)
Total Bilirubin: 0.3 mg/dL (ref 0.2–1.2)
Total Protein: 6.7 g/dL (ref 6.0–8.3)

## 2018-07-19 LAB — TSH: TSH: 2.42 u[IU]/mL (ref 0.35–4.50)

## 2018-07-19 LAB — LIPID PANEL
Cholesterol: 181 mg/dL (ref 0–200)
HDL: 80.5 mg/dL (ref >39.00)
LDL Cholesterol: 90 mg/dL (ref 0–99)
NonHDL: 100.89
Total CHOL/HDL Ratio: 2
Triglycerides: 52 mg/dL (ref 0.0–149.0)
VLDL: 10.4 mg/dL (ref 0.0–40.0)

## 2018-07-19 LAB — HEMOGLOBIN A1C: Hgb A1c MFr Bld: 6.5 % (ref 4.6–6.5)

## 2018-07-21 ENCOUNTER — Encounter: Payer: Managed Care, Other (non HMO) | Admitting: Family Medicine

## 2018-07-25 ENCOUNTER — Ambulatory Visit (INDEPENDENT_AMBULATORY_CARE_PROVIDER_SITE_OTHER): Payer: BLUE CROSS/BLUE SHIELD | Admitting: Family Medicine

## 2018-07-25 ENCOUNTER — Encounter: Payer: Self-pay | Admitting: Family Medicine

## 2018-07-25 VITALS — Wt 246.0 lb

## 2018-07-25 DIAGNOSIS — F341 Dysthymic disorder: Secondary | ICD-10-CM

## 2018-07-25 DIAGNOSIS — R7303 Prediabetes: Secondary | ICD-10-CM

## 2018-07-25 DIAGNOSIS — D509 Iron deficiency anemia, unspecified: Secondary | ICD-10-CM

## 2018-07-25 DIAGNOSIS — K219 Gastro-esophageal reflux disease without esophagitis: Secondary | ICD-10-CM

## 2018-07-25 DIAGNOSIS — Z0000 Encounter for general adult medical examination without abnormal findings: Secondary | ICD-10-CM | POA: Diagnosis not present

## 2018-07-25 MED ORDER — POLYSACCHARIDE IRON COMPLEX 150 MG PO CAPS: 150.0000 mg | ORAL_CAPSULE | Freq: Two times a day (BID) | ORAL | 3 refills | Status: DC

## 2018-07-25 NOTE — Patient Instructions (Addendum)
Increase your iron to twice daily -I sent that in  Let's re check labs for anemia in about 2 months (our staff will call you to set that up)  Continue your new diet plan (change it to the diabetic plan to help insulin tolerance)  Keep walking -increase distance /time as the season progresses  Follow up with gyn yearly  Take care of yourself   Think about a diet journal to see what may cause loose stools for you  A fiber supplement like citrucel may help also  If you find your protonix is the problem-please let me know

## 2018-07-25 NOTE — Assessment & Plan Note (Signed)
Reviewed health habits including diet and exercise and skin cancer prevention Reviewed appropriate screening tests for age  Also reviewed health mt list, fam hx and immunization status , as well as social and family history   Enc further wt loss and healthy habits  Mammogram due in July along with gyn visit  utd colon cancer screening  Enc ca plus D for bone health

## 2018-07-25 NOTE — Assessment & Plan Note (Signed)
Enc her to continue her new diet plan and wt loss Discussed how this problem influences overall health and the risks it imposes  Reviewed plan for weight loss with lower calorie diet (via better food choices and also portion control or program like weight watchers) and exercise building up to or more than 30 minutes 5 days per week including some aerobic activity   Commended good work so far

## 2018-07-25 NOTE — Assessment & Plan Note (Signed)
Lab Results  Component Value Date   HGBA1C 6.5 07/19/2018   Will watch this carefully  disc imp of low glycemic diet and wt loss to prevent DM2  She plans to continue weight loss which should help

## 2018-07-25 NOTE — Assessment & Plan Note (Signed)
Stable on fluoxetine Reviewed stressors/ coping techniques/symptoms/ support sources/ tx options and side effects in detail today No changes made  Enc her to exercise

## 2018-07-25 NOTE — Assessment & Plan Note (Signed)
Need to inc iron to bid - Hb still low and ferritin very low Will re check in 2 mo  If no improvement consider IV iron -pt is agreeable  Neg GI w/u if reassuring

## 2018-07-25 NOTE — Progress Notes (Signed)
Virtual Visit via Video Note  I connected with Meagan Conway on 07/25/18 at  3:15 PM EDT by a video enabled telemedicine application and verified that I am speaking with the correct person using two identifiers.  Location: Patient: home  Provider: office    I discussed the limitations of evaluation and management by telemedicine and the availability of in person appointments. The patient expressed understanding and agreed to proceed.  History of Present Illness: Here for health maintenance exam and to review chronic medical problems  Going to work -essential employee  In office by herself   Still tired all the time      Wt Readings from Last 3 Encounters:  09/23/17 252 lb (114.3 kg)  08/29/17 247 lb (112 kg)  08/23/17 249 lb 12.8 oz (113.3 kg)  last bmi of 43.2   At home she is watching sizes /hates getting on scales  Last was 246   On nutra system - 2 mo  Lots of salads  Walking for exercise in the afternoon 20-30 minutes  Getting used to a healthier lifestyle   Mammogram 7/19 at [facility] - nl  Self breast exam -no lumps   Flu shot 10/19  Tetanus shot 10/16  Colonoscopy 6/18 Dr Durwin Reges -showed ulceration   Pap 7/19 neg with no HPV (Dr Hulan Fray)   Mood (anxiety and depression)  Taking fluoxetine 20 mg  A little bit anxious -but doing ok   GERD Takes protonix 40 mg -works well for heartburn  She had EGD and small bowel endoscopy in 10/19 -no abn findings  More bowel movements now - 5-6 times per day / loose stool  No former diag of IBS  No blood in her stool  Not overly stressed    Iron def anemia  Lab Results  Component Value Date   WBC 6.8 07/19/2018   HGB 9.8 (L) 07/19/2018   HCT 31.7 (L) 07/19/2018   MCV 67.4 Repeated and verified X2. (L) 07/19/2018   PLT 364.0 07/19/2018   Lab Results  Component Value Date   FERRITIN 4 (L) 08/19/2017   Cholesterol Lab Results  Component Value Date   CHOL 181 07/19/2018   CHOL 180 07/14/2017   CHOL  170 07/08/2016   Lab Results  Component Value Date   HDL 80.50 07/19/2018   HDL 88.20 07/14/2017   HDL 72.90 07/08/2016   Lab Results  Component Value Date   LDLCALC 90 07/19/2018   LDLCALC 83 07/14/2017   LDLCALC 80 07/08/2016   Lab Results  Component Value Date   TRIG 52.0 07/19/2018   TRIG 42.0 07/14/2017   TRIG 87.0 07/08/2016   Lab Results  Component Value Date   CHOLHDL 2 07/19/2018   CHOLHDL 2 07/14/2017   CHOLHDL 2 07/08/2016   No results found for: LDLDIRECT  Chemistries Lab Results  Component Value Date   CREATININE 0.95 07/19/2018   BUN 17 07/19/2018   NA 140 07/19/2018   K 4.3 07/19/2018   CL 105 07/19/2018   CO2 26 07/19/2018   Lab Results  Component Value Date   ALT 14 07/19/2018   AST 15 07/19/2018   ALKPHOS 76 07/19/2018   BILITOT 0.3 07/19/2018   Lab Results  Component Value Date   TSH 2.42 07/19/2018    Prediabetes  Glucose 109  Lab Results  Component Value Date   HGBA1C 6.5 07/19/2018   This is up from 6.3  Some sweets with nutrasystem -has some chocolate and some cake  Review of Systems  Constitutional: Positive for malaise/fatigue and weight loss. Negative for chills, diaphoresis and fever.  HENT: Negative for hearing loss and sore throat.   Eyes: Negative for blurred vision.  Respiratory: Negative for cough and shortness of breath.   Cardiovascular: Negative for chest pain, palpitations and leg swelling.  Gastrointestinal: Positive for diarrhea.  Musculoskeletal: Negative for joint pain and myalgias.  Skin: Negative for rash.  Neurological: Negative for dizziness and headaches.  Psychiatric/Behavioral: Negative for memory loss.       Mood is stable       Patient Active Problem List   Diagnosis Date Noted  . Special screening for malignant neoplasms, colon   . Ulceration of intestine   . Iron deficiency anemia 12/29/2014  . Colon cancer screening 07/08/2014  . Prediabetes 07/04/2013  . Routine general medical  examination at a health care facility 06/04/2011  . GERD (gastroesophageal reflux disease) 03/10/2011  . Morbid obesity ([place]) 03/10/2011  . COLONIC POLYPS 04/22/2010  . ANXIETY DEPRESSION 02/28/2008   Past Medical History:  Diagnosis Date  . Depression    with anxious features  . GERD (gastroesophageal reflux disease)   . Hypertension 05/2005   H/O. no meds currently  . Obesity   . Vertigo    last episode 03/18   Past Surgical History:  Procedure Laterality Date  . COLONOSCOPY WITH PROPOFOL N/A 08/27/2016   Procedure: COLONOSCOPY WITH PROPOFOL;  Surgeon: Lucilla Lame, MD;  Location: [facility];  Service: Endoscopy;  Laterality: N/A;  . ENDOMETRIAL ABLATION W/ NOVASURE    . ESOPHAGOGASTRODUODENOSCOPY (EGD) WITH PROPOFOL N/A 08/29/2017   Procedure: ESOPHAGOGASTRODUODENOSCOPY (EGD) WITH PROPOFOL;  Surgeon: Lucilla Lame, MD;  Location: [facility];  Service: Endoscopy;  Laterality: N/A;  pt request early arrival time  . GIVENS CAPSULE STUDY N/A 09/28/2017   Procedure: GIVENS CAPSULE STUDY;  Surgeon: Lucilla Lame, MD;  Location[facility];  Service: Endoscopy;  Laterality: N/A;  . OVARIAN CYST SURGERY  08/2002  . VESICO-VAGINAL FISTULA REPAIR  1994   Social History   Tobacco Use  . Smoking status: Former Smoker    Last attempt to quit: 2014    Years since quitting: 6.3  . Smokeless tobacco: Never Used  . Tobacco comment: quit many years ago.  Substance Use Topics  . Alcohol use: Yes    Alcohol/week: 5.0 standard drinks    Types: 2 Glasses of wine, 3 Cans of beer per week    Comment:    . Drug use: No   Family History  Problem Relation Age of Onset  . Hypertension Mother        died of GI bleed-unexpected   . Hypertension Father   . Heart disease Father 34       MI  . Alcohol abuse Father   . Heart disease Maternal Grandmother   . Heart disease Paternal Grandmother    No Known Allergies Current Outpatient Medications on File Prior to Visit   Medication Sig Dispense Refill  . FLUoxetine (PROZAC) 20 MG capsule Take 1 capsule (20 mg total) by mouth daily. 90 capsule 3  . pantoprazole (PROTONIX) 40 MG tablet Take 1 tablet (40 mg total) by mouth daily. 90 tablet 3   No current facility-administered medications on file prior to visit.     Observations/Objective: Pt appears well with some wt loss  Not distressed/her normal self Nl affect  No facial swelling or asymmetry  Nl voice/not hoarse Skin tone looks baseline/fair -w/o pallor or rash  Talkative and mentally sharp    Assessment and Plan: Problem List Items Addressed This Visit      Digestive   GERD (gastroesophageal reflux disease)    Continues protonix which works well  ? If it may cause loose stool Pt may try a day off and see Enc further wt loss and diet change         Other   ANXIETY DEPRESSION    Stable on fluoxetine Reviewed stressors/ coping techniques/symptoms/ support sources/ tx options and side effects in detail today No changes made  Enc her to exercise       Morbid obesity ([place])    Enc her to continue her new diet plan and wt loss Discussed how this problem influences overall health and the risks it imposes  Reviewed plan for weight loss with lower calorie diet (via better food choices and also portion control or program like weight watchers) and exercise building up to or more than 30 minutes 5 days per week including some aerobic activity   Commended good work so far      Routine general medical examination at a health care facility - Primary    Reviewed health habits including diet and exercise and skin cancer prevention Reviewed appropriate screening tests for age  Also reviewed health mt list, fam hx and immunization status , as well as social and family history   Enc further wt loss and healthy habits  Mammogram due in July along with gyn visit  utd colon cancer screening  Enc ca plus D for bone health       Prediabetes    Lab  Results  Component Value Date   HGBA1C 6.5 07/19/2018   Will watch this carefully  disc imp of low glycemic diet and wt loss to prevent DM2  She plans to continue weight loss which should help      Iron deficiency anemia    Need to inc iron to bid - Hb still low and ferritin very low Will re check in 2 mo  If no improvement consider IV iron -pt is agreeable  Neg GI w/u if reassuring        Relevant Medications   iron polysaccharides (NIFEREX) 150 MG capsule   Other Relevant Orders   CBC with Differential/Platelet   Ferritin       Follow Up Instructions: Increase your iron to twice daily -I sent that in  Let's re check labs for anemia in about 2 months (our staff will call you to set that up)  Continue your new diet plan (change it to the diabetic plan to help insulin tolerance)  Keep walking -increase distance /time as the season progresses  Follow up with gyn yearly  Take care of yourself   Think about a diet journal to see what may cause loose stools for you  A fiber supplement like citrucel may help also  If you find your protonix is the problem-please let me know    I discussed the assessment and treatment plan with the patient. The patient was provided an opportunity to ask questions and all were answered. The patient agreed with the plan and demonstrated an understanding of the instructions.   The patient was advised to call back or seek an in-person evaluation if the symptoms worsen or if the condition fails to improve as anticipated.     Loura Pardon, MD

## 2018-07-25 NOTE — Assessment & Plan Note (Signed)
Continues protonix which works well  ? If it may cause loose stool Pt may try a day off and see Enc further wt loss and diet change

## 2018-08-10 ENCOUNTER — Other Ambulatory Visit: Payer: Self-pay | Admitting: Family Medicine

## 2018-09-22 ENCOUNTER — Telehealth: Payer: Self-pay

## 2018-09-22 NOTE — Telephone Encounter (Signed)
Left detailed VM w COVID screen and back door lab info   

## 2018-09-26 ENCOUNTER — Other Ambulatory Visit (INDEPENDENT_AMBULATORY_CARE_PROVIDER_SITE_OTHER): Payer: BC Managed Care – PPO

## 2018-09-26 DIAGNOSIS — D509 Iron deficiency anemia, unspecified: Secondary | ICD-10-CM

## 2018-09-27 ENCOUNTER — Encounter: Payer: Self-pay | Admitting: Radiology

## 2018-09-27 LAB — CBC WITH DIFFERENTIAL/PLATELET
Basophils Absolute: 0.1 10*3/uL (ref 0.0–0.1)
Basophils Relative: 0.9 % (ref 0.0–3.0)
Eosinophils Absolute: 0.2 10*3/uL (ref 0.0–0.7)
Eosinophils Relative: 1.9 % (ref 0.0–5.0)
HCT: 32.8 % — ABNORMAL LOW (ref 36.0–46.0)
Hemoglobin: 10.1 g/dL — ABNORMAL LOW (ref 12.0–15.0)
Lymphocytes Relative: 23.1 % (ref 12.0–46.0)
Lymphs Abs: 2.3 10*3/uL (ref 0.7–4.0)
MCHC: 30.7 g/dL (ref 30.0–36.0)
MCV: 71.3 fl — ABNORMAL LOW (ref 78.0–100.0)
Monocytes Absolute: 0.7 10*3/uL (ref 0.1–1.0)
Monocytes Relative: 6.7 % (ref 3.0–12.0)
Neutro Abs: 6.6 10*3/uL (ref 1.4–7.7)
Neutrophils Relative %: 67.4 % (ref 43.0–77.0)
Platelets: 353 10*3/uL (ref 150.0–400.0)
RBC: 4.6 Mil/uL (ref 3.87–5.11)
RDW: 19.6 % — ABNORMAL HIGH (ref 11.5–15.5)
WBC: 9.8 10*3/uL (ref 4.0–10.5)

## 2018-09-27 LAB — FERRITIN: Ferritin: 4.8 ng/mL — ABNORMAL LOW (ref 10.0–291.0)

## 2018-09-28 ENCOUNTER — Telehealth: Payer: Self-pay | Admitting: Family Medicine

## 2018-09-28 DIAGNOSIS — D509 Iron deficiency anemia, unspecified: Secondary | ICD-10-CM

## 2018-09-28 NOTE — Telephone Encounter (Signed)
-----   Message from Tammi Sou, [place] sent at 09/28/2018  9:30 AM EDT ----- Pt did view her labs on mychart and she agreed with hematologist referral, please put referral in and I advise pt our [facility] will call to schedule appt

## 2018-09-28 NOTE — Telephone Encounter (Signed)
Ref done and routed to [facility]

## 2018-10-03 ENCOUNTER — Inpatient Hospital Stay: Payer: BC Managed Care – PPO

## 2018-10-03 ENCOUNTER — Other Ambulatory Visit: Payer: Self-pay

## 2018-10-03 ENCOUNTER — Encounter: Payer: Self-pay | Admitting: Oncology

## 2018-10-03 ENCOUNTER — Inpatient Hospital Stay: Payer: BC Managed Care – PPO | Attending: Oncology | Admitting: Oncology

## 2018-10-03 VITALS — BP 153/103 | Temp 97.2°F | Resp 18 | Ht 65.95 in | Wt 243.9 lb

## 2018-10-03 DIAGNOSIS — D509 Iron deficiency anemia, unspecified: Secondary | ICD-10-CM

## 2018-10-03 DIAGNOSIS — D649 Anemia, unspecified: Secondary | ICD-10-CM

## 2018-10-03 DIAGNOSIS — R5383 Other fatigue: Secondary | ICD-10-CM | POA: Diagnosis not present

## 2018-10-03 DIAGNOSIS — R300 Dysuria: Secondary | ICD-10-CM

## 2018-10-03 DIAGNOSIS — D500 Iron deficiency anemia secondary to blood loss (chronic): Secondary | ICD-10-CM

## 2018-10-03 HISTORY — DX: Iron deficiency anemia, unspecified: D50.9

## 2018-10-03 LAB — URINALYSIS, DIPSTICK ONLY
Bilirubin Urine: NEGATIVE
Glucose, UA: NEGATIVE mg/dL
Hgb urine dipstick: NEGATIVE
Ketones, ur: NEGATIVE mg/dL
Leukocytes,Ua: NEGATIVE
Nitrite: NEGATIVE
Protein, ur: NEGATIVE mg/dL
Specific Gravity, Urine: 1.015 (ref 1.005–1.030)
pH: 6 (ref 5.0–8.0)

## 2018-10-03 NOTE — Progress Notes (Signed)
Patient has been taking oral iron for 1 year and hemoglobin is not improving.    Bp 171/100, 162/107, 153/103 and denies any symptoms.  Patient reports being under a lot of stress due to the loss of her son 3 weeks ago.  She was treated for hypertension years ago with Lisinopril/HCTZ and was able to stop meds due to improved blood pressure.

## 2018-10-04 NOTE — Progress Notes (Signed)
Hematology/Oncology Consult note [facility] Telephone:([phone] [phone]   Patient Care Team: Tower, Wynelle Fanny, MD as PCP - General  REFERRING PROVIDER: Tower, Wynelle Fanny, MD CHIEF COMPLAINTS/REASON FOR VISIT:  Evaluation of iron deficiency anemia  HISTORY OF PRESENTING ILLNESS:  Meagan Conway is a  58 y.o.  female with PMH listed below was seen in consultation at the request of Tower, Wynelle Fanny, MD   for evaluation of iron deficiency anemia.   Reviewed patient's recent labs done on 09/26/2018. CBC showed hemoglobin of 10.1, MCV 71.3, platelet 353,000, WBC 9.8, normal differential Ferritin was checked and was decreased at 4.8.    Reviewed patient's previous labs ordered by other physicians, anemia is chronic onset , duration is since 2016. No aggravating or improving factors.  Associated signs and symptoms: Patient reports fatigue.  Reports SOB with exertion.  She also feels very stressed as her son passed away 3 weeks ago.  denies weight loss, easy bruising, hematochezia, hemoptysis, hematuria. Context:  History of iron deficiency: Reports chronic history of iron deficiency.  She has been taking oral iron supplementation for the past year.  Hemoglobin is not improved. Rectal bleeding: Denies Menstrual bleeding/ Vaginal bleeding : Denies Hematemesis or hemoptysis : denies Blood in urine : denies  Last endoscopy:08/29/2017 upper endoscopy showed large hiatal hernia.  Normal stomach. 08/27/2016 colonoscopy showed focal active colitis with ulceration.  Negative for dysplasia. 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 Fatigue: Yes.  SOB: deneis  Denies hematochezia, hematuria, hematemesis, epistaxis, black tarry stool or easy bruising.    Review of Systems  Constitutional: Negative for appetite change, chills and fever.  HENT:   Negative for hearing loss and voice change.   Eyes: Negative  for eye problems.  Respiratory: Negative for chest tightness and cough.   Cardiovascular: Negative for chest pain.  Gastrointestinal: Negative for abdominal distention, abdominal pain and blood in stool.  Endocrine: Negative for hot flashes.  Genitourinary: Negative for difficulty urinating and frequency.   Musculoskeletal: Negative for arthralgias.  Skin: Negative for itching and rash.  Neurological: Negative for extremity weakness.  Hematological: Negative for adenopathy.  Psychiatric/Behavioral: Positive for depression. Negative for confusion.    MEDICAL HISTORY:  Past Medical History:  Diagnosis Date  . Anemia   . Depression    with anxious features  . GERD (gastroesophageal reflux disease)   . Hypertension 05/2005   H/O. no meds currently  . IDA (iron deficiency anemia) 10/03/2018  . Obesity   . Vertigo    last episode 03/18    SURGICAL HISTORY: Past Surgical History:  Procedure Laterality Date  . COLONOSCOPY WITH PROPOFOL N/A 08/27/2016   Procedure: COLONOSCOPY WITH PROPOFOL;  Surgeon: Lucilla Lame, MD;  Location: [place];  Service: Endoscopy;  Laterality: N/A;  . ENDOMETRIAL ABLATION W/ NOVASURE    . ESOPHAGOGASTRODUODENOSCOPY (EGD) WITH PROPOFOL N/A 08/29/2017   Procedure: ESOPHAGOGASTRODUODENOSCOPY (EGD) WITH PROPOFOL;  Surgeon: Lucilla Lame, MD;  Location: [place];  Service: Endoscopy;  Laterality: N/A;  pt request early arrival time  . GIVENS CAPSULE STUDY N/A 09/28/2017   Procedure: GIVENS CAPSULE STUDY;  Surgeon: Lucilla Lame, MD;  Location: [facility] ENDOSCOPY;  Service: Endoscopy;  Laterality: N/A;  . OVARIAN CYST SURGERY  08/2002  . VESICO-VAGINAL FISTULA REPAIR  1994    SOCIAL HISTORY: Social History   Socioeconomic History  . Marital status: Married    Spouse name: Not on file  . Number  of children: Not on file  . Years of education: Not on file  . Highest education level: Not on file  Occupational History  . Not on file  Social  Needs  . Financial resource strain: Not on file  . Food insecurity    Worry: Not on file    Inability: Not on file  . Transportation needs    Medical: Not on file    Non-medical: Not on file  Tobacco Use  . Smoking status: Former Smoker    Quit date: 2014    Years since quitting: 6.5  . Smokeless tobacco: Never Used  . Tobacco comment: quit many years ago.  Substance and Sexual Activity  . Alcohol use: Yes    Alcohol/week: 5.0 standard drinks    Types: 2 Glasses of wine, 3 Cans of beer per week    Comment:    . Drug use: No  . Sexual activity: Yes    Partners: Male    Birth control/protection: None    Comment: husband has a vasectomy  Lifestyle  . Physical activity    Days per week: Not on file    Minutes per session: Not on file  . Stress: Not on file  Relationships  . Social Herbalist on phone: Not on file    Gets together: Not on file    Attends religious service: Not on file    Active member of club or organization: Not on file    Attends meetings of clubs or organizations: Not on file    Relationship status: Not on file  . Intimate partner violence    Fear of current or ex partner: Not on file    Emotionally abused: Not on file    Physically abused: Not on file    Forced sexual activity: Not on file  Other Topics Concern  . Not on file  Social History Narrative  . Not on file    FAMILY HISTORY: Family History  Problem Relation Age of Onset  . Hypertension Mother        died of GI bleed-unexpected   . Hypertension Father   . Heart disease Father 43       MI  . Alcohol abuse Father   . Heart disease Maternal Grandmother   . Heart disease Paternal Grandmother     ALLERGIES:  has No Known Allergies.  MEDICATIONS:  Current Outpatient Medications  Medication Sig Dispense Refill  . FLUoxetine (PROZAC) 20 MG capsule Take 1 capsule (20 mg total) by mouth daily. 90 capsule 1  . iron polysaccharides (NIFEREX) 150 MG capsule Take 1 capsule (150  mg total) by mouth 2 (two) times daily. 180 capsule 3  . pantoprazole (PROTONIX) 40 MG tablet Take 1 tablet (40 mg total) by mouth daily. (Patient not taking: Reported on 10/03/2018) 90 tablet 1   No current facility-administered medications for this visit.      PHYSICAL EXAMINATION: ECOG PERFORMANCE STATUS: 1 - Symptomatic but completely ambulatory Vitals:   10/03/18 1457  BP: (!) 153/103  Resp: 18  Temp: (!) 97.2 F (36.2 C)   Filed Weights   10/03/18 1457  Weight: 243 lb 14.4 oz (110.6 kg)    Physical Exam Constitutional:      General: She is not in acute distress. HENT:     Head: Normocephalic and atraumatic.  Eyes:     General: No scleral icterus.    Pupils: Pupils are equal, round, and reactive to light.  Neck:  Musculoskeletal: Normal range of motion and neck supple.  Cardiovascular:     Rate and Rhythm: Normal rate and regular rhythm.     Heart sounds: Normal heart sounds.  Pulmonary:     Effort: Pulmonary effort is normal. No respiratory distress.     Breath sounds: No wheezing.  Abdominal:     General: Bowel sounds are normal. There is no distension.     Palpations: Abdomen is soft. There is no mass.     Tenderness: There is no abdominal tenderness.  Musculoskeletal: Normal range of motion.        General: No deformity.  Skin:    General: Skin is warm and dry.     Findings: No erythema or rash.  Neurological:     Mental Status: She is alert and oriented to person, place, and time.     Cranial Nerves: No cranial nerve deficit.     Coordination: Coordination normal.  Psychiatric:        Behavior: Behavior normal.        Thought Content: Thought content normal.       CMP Latest Ref Rng & Units 07/19/2018  Glucose 70 - 99 mg/dL 109(H)  BUN 6 - 23 mg/dL 17  Creatinine 0.40 - 1.20 mg/dL 0.95  Sodium 135 - 145 mEq/L 140  Potassium 3.5 - 5.1 mEq/L 4.3  Chloride 96 - 112 mEq/L 105  CO2 19 - 32 mEq/L 26  Calcium 8.4 - 10.5 mg/dL 8.9  Total Protein  6.0 - 8.3 g/dL 6.7  Total Bilirubin 0.2 - 1.2 mg/dL 0.3  Alkaline Phos 39 - 117 U/L 76  AST 0 - 37 U/L 15  ALT 0 - 35 U/L 14   CBC Latest Ref Rng & Units 09/26/2018  WBC 4.0 - 10.5 K/uL 9.8  Hemoglobin 12.0 - 15.0 g/dL 10.1(L)  Hematocrit 36.0 - 46.0 % 32.8(L)  Platelets 150.0 - 400.0 K/uL 353.0     LABORATORY DATA:  I have reviewed the data as listed Lab Results  Component Value Date   WBC 9.8 09/26/2018   HGB 10.1 (L) 09/26/2018   HCT 32.8 (L) 09/26/2018   MCV 71.3 (L) 09/26/2018   PLT 353.0 09/26/2018   Recent Labs    07/19/18 0849  NA 140  K 4.3  CL 105  CO2 26  GLUCOSE 109*  BUN 17  CREATININE 0.95  CALCIUM 8.9  PROT 6.7  ALBUMIN 4.1  AST 15  ALT 14  ALKPHOS 76  BILITOT 0.3   Iron/TIBC/Ferritin/ %Sat    Component Value Date/Time   IRON 66 08/13/2010 0759   FERRITIN 4.8 (L) 09/26/2018 1541     No results found.    ASSESSMENT & PLAN:  1. Iron deficiency anemia due to chronic blood loss   2. Fatigue associated with anemia    Labs are reviewed and discussed with patient. Consistent with iron deficiency anemia. Most likely secondary to chronic blood loss from GI tract.  She has extensive GI work-up previously.  Recommend patient to discuss with gastroenterology for reevaluation and management of AVM. I will obtain UA.  Plan IV iron with Venofer 200mg  weekly x 4 doses. Allergy reactions/infusion reaction including anaphylactic reaction discussed with patient. Other side effects include but not limited to high blood pressure, skin rash, weight gain, leg swelling, etc. Patient voices understanding and willing to proceed.   Orders Placed This Encounter  Procedures  . Urinalysis, dipstick only    Standing Status:   Future  Number of Occurrences:   1    Standing Expiration Date:   10/03/2019    All questions were answered. The patient knows to call the clinic with any problems questions or concerns.  Cc Tower, Wynelle Fanny, MD  Return of visit: 8 weeks  for reassessment. Thank you for this kind referral and the opportunity to participate in the care of this patient. A copy of today's note is routed to referring provider  Total face to face encounter time for this patient visit was 8min. >50% of the time was  spent in counseling and coordination of care.    Earlie Server, MD, PhD Hematology Oncology [facility] at [facility] Pager- [phone] 10/04/2018

## 2018-10-06 ENCOUNTER — Other Ambulatory Visit: Payer: Self-pay

## 2018-10-09 ENCOUNTER — Inpatient Hospital Stay: Payer: BC Managed Care – PPO

## 2018-10-09 ENCOUNTER — Other Ambulatory Visit: Payer: Self-pay

## 2018-10-09 VITALS — BP 139/86

## 2018-10-09 DIAGNOSIS — D500 Iron deficiency anemia secondary to blood loss (chronic): Secondary | ICD-10-CM

## 2018-10-09 DIAGNOSIS — R5383 Other fatigue: Secondary | ICD-10-CM | POA: Diagnosis not present

## 2018-10-09 MED ORDER — SODIUM CHLORIDE 0.9 % IV SOLN
Freq: Once | INTRAVENOUS | Status: AC
  Administered 2018-10-09: 14:00:00 via INTRAVENOUS
  Filled 2018-10-09: qty 250

## 2018-10-09 MED ORDER — IRON SUCROSE 20 MG/ML IV SOLN
200.0000 mg | Freq: Once | INTRAVENOUS | Status: AC
  Administered 2018-10-09: 200 mg via INTRAVENOUS
  Filled 2018-10-09: qty 10

## 2018-10-13 ENCOUNTER — Other Ambulatory Visit: Payer: Self-pay

## 2018-10-16 ENCOUNTER — Inpatient Hospital Stay: Payer: BC Managed Care – PPO | Attending: Oncology

## 2018-10-16 ENCOUNTER — Other Ambulatory Visit: Payer: Self-pay

## 2018-10-16 VITALS — BP 129/86 | HR 60 | Temp 97.4°F | Resp 18

## 2018-10-16 DIAGNOSIS — D500 Iron deficiency anemia secondary to blood loss (chronic): Secondary | ICD-10-CM | POA: Diagnosis not present

## 2018-10-16 MED ORDER — SODIUM CHLORIDE 0.9 % IV SOLN
Freq: Once | INTRAVENOUS | Status: AC
  Administered 2018-10-16: 14:00:00 via INTRAVENOUS
  Filled 2018-10-16: qty 250

## 2018-10-16 MED ORDER — IRON SUCROSE 20 MG/ML IV SOLN
200.0000 mg | Freq: Once | INTRAVENOUS | Status: AC
  Administered 2018-10-16: 200 mg via INTRAVENOUS
  Filled 2018-10-16: qty 10

## 2018-10-23 ENCOUNTER — Other Ambulatory Visit: Payer: Self-pay

## 2018-10-23 ENCOUNTER — Inpatient Hospital Stay: Payer: BC Managed Care – PPO

## 2018-10-23 VITALS — BP 135/84 | HR 66 | Resp 18

## 2018-10-23 DIAGNOSIS — D500 Iron deficiency anemia secondary to blood loss (chronic): Secondary | ICD-10-CM

## 2018-10-23 MED ORDER — SODIUM CHLORIDE 0.9 % IV SOLN
Freq: Once | INTRAVENOUS | Status: AC
  Administered 2018-10-23: 14:00:00 via INTRAVENOUS
  Filled 2018-10-23: qty 250

## 2018-10-23 MED ORDER — IRON SUCROSE 20 MG/ML IV SOLN
200.0000 mg | Freq: Once | INTRAVENOUS | Status: AC
  Administered 2018-10-23: 200 mg via INTRAVENOUS
  Filled 2018-10-23: qty 10

## 2018-10-24 ENCOUNTER — Encounter: Payer: Self-pay | Admitting: Oncology

## 2018-10-26 ENCOUNTER — Ambulatory Visit: Payer: BC Managed Care – PPO | Admitting: Obstetrics & Gynecology

## 2018-10-27 ENCOUNTER — Other Ambulatory Visit: Payer: Self-pay

## 2018-10-30 ENCOUNTER — Inpatient Hospital Stay: Payer: BC Managed Care – PPO

## 2018-10-30 ENCOUNTER — Other Ambulatory Visit: Payer: Self-pay

## 2018-10-30 VITALS — BP 140/78 | HR 60 | Resp 18

## 2018-10-30 DIAGNOSIS — D500 Iron deficiency anemia secondary to blood loss (chronic): Secondary | ICD-10-CM | POA: Diagnosis not present

## 2018-10-30 MED ORDER — SODIUM CHLORIDE 0.9 % IV SOLN
Freq: Once | INTRAVENOUS | Status: AC
  Administered 2018-10-30: 14:00:00 via INTRAVENOUS
  Filled 2018-10-30: qty 250

## 2018-10-30 MED ORDER — IRON SUCROSE 20 MG/ML IV SOLN
200.0000 mg | Freq: Once | INTRAVENOUS | Status: AC
  Administered 2018-10-30: 200 mg via INTRAVENOUS

## 2018-11-07 ENCOUNTER — Other Ambulatory Visit: Payer: Self-pay

## 2018-11-07 ENCOUNTER — Ambulatory Visit (INDEPENDENT_AMBULATORY_CARE_PROVIDER_SITE_OTHER): Payer: BC Managed Care – PPO | Admitting: Family Medicine

## 2018-11-07 ENCOUNTER — Encounter: Payer: Self-pay | Admitting: Family Medicine

## 2018-11-07 DIAGNOSIS — B9789 Other viral agents as the cause of diseases classified elsewhere: Secondary | ICD-10-CM | POA: Diagnosis not present

## 2018-11-07 DIAGNOSIS — J069 Acute upper respiratory infection, unspecified: Secondary | ICD-10-CM | POA: Diagnosis not present

## 2018-11-07 DIAGNOSIS — Z20822 Contact with and (suspected) exposure to covid-19: Secondary | ICD-10-CM

## 2018-11-07 NOTE — Patient Instructions (Signed)
Drink fluids and rest  Isolate yourself until results return and you are symptom free mucinex DM or robitussin dm are good for cough Tylenol for pain or fever Lozenges as needed  Update if not starting to improve in a week or if worsening   Especially if worse cough or any shortness of breath or new symptoms  Go to the [employer] testing site on [address] for covid testing  We will alert you when results return

## 2018-11-07 NOTE — Progress Notes (Signed)
Virtual Visit via Video Note  I connected with Meagan Conway on 11/07/18 at 10:00 AM EDT by a video enabled telemedicine application and verified that I am speaking with the correct person using two identifiers.  Location: Patient: home Provider: office   I discussed the limitations of evaluation and management by telemedicine and the availability of in person appointments. The patient expressed understanding and agreed to proceed.  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 Viral URI with cough 11/07/2018  . IDA (iron deficiency anemia) 10/03/2018  . Special screening for malignant neoplasms, colon   . Ulceration of intestine   . Iron deficiency anemia 12/29/2014  . Colon cancer screening 07/08/2014  . Prediabetes 07/04/2013  . Routine general medical examination at a health care facility 06/04/2011  . GERD (gastroesophageal reflux disease) 03/10/2011  . Morbid obesity ([place]) 03/10/2011  . COLONIC POLYPS 04/22/2010  . ANXIETY DEPRESSION 02/28/2008   Past Medical History:   Diagnosis Date  . Anemia   . Depression    with anxious features  . GERD (gastroesophageal reflux disease)   . Hypertension 05/2005   H/O. no meds currently  . IDA (iron deficiency anemia) 10/03/2018  . Obesity   . Vertigo    last episode 03/18   Past Surgical History:  Procedure Laterality Date  . COLONOSCOPY WITH PROPOFOL N/A 08/27/2016   Procedure: COLONOSCOPY WITH PROPOFOL;  Surgeon: Lucilla Lame, MD;  Location: [place];  Service: Endoscopy;  Laterality: N/A;  . ENDOMETRIAL ABLATION W/ NOVASURE    . ESOPHAGOGASTRODUODENOSCOPY (EGD) WITH PROPOFOL N/A 08/29/2017   Procedure: ESOPHAGOGASTRODUODENOSCOPY (EGD) WITH PROPOFOL;  Surgeon: Lucilla Lame, MD;  Location: [place];  Service: Endoscopy;  Laterality: N/A;  pt request early arrival time  . GIVENS CAPSULE STUDY N/A 09/28/2017   Procedure: GIVENS CAPSULE STUDY;  Surgeon: Lucilla Lame, MD;  Location[facility] ENDOSCOPY;  Service: Endoscopy;  Laterality: N/A;  . OVARIAN CYST SURGERY  08/2002  . VESICO-VAGINAL FISTULA REPAIR  1994   Social History   Tobacco Use  . Smoking status: Former Smoker    Quit date: 2014    Years since quitting: 6.6  . Smokeless tobacco: Never Used  . Tobacco comment: quit many years ago.  Substance Use Topics  . Alcohol use: Yes    Alcohol/week: 5.0 standard drinks    Types: 2 Glasses  of wine, 3 Cans of beer per week    Comment: occ  . Drug use: No   Family History  Problem Relation Age of Onset  . Hypertension Mother        died of GI bleed-unexpected   . Hypertension Father   . Heart disease Father 2       MI  . Alcohol abuse Father   . Heart disease Maternal Grandmother   . Heart disease Paternal Grandmother    No Known Allergies Current Outpatient Medications on File Prior to Visit  Medication Sig Dispense Refill  . FLUoxetine (PROZAC) 20 MG capsule Take 1 capsule (20 mg total) by mouth daily. 90 capsule 1  . pantoprazole (PROTONIX) 40 MG tablet Take 1 tablet (40 mg  total) by mouth daily. 90 tablet 1  . iron polysaccharides (NIFEREX) 150 MG capsule Take 1 capsule (150 mg total) by mouth 2 (two) times daily. (Patient not taking: Reported on 11/07/2018) 180 capsule 3   No current facility-administered medications on file prior to visit.     Review of Systems  Constitutional: Negative for chills, diaphoresis, fever and weight loss.  HENT: Positive for sore throat. Negative for congestion, ear discharge, ear pain and sinus pain.   Eyes: Negative for blurred vision, discharge and redness.  Respiratory: Positive for cough and sputum production. Negative for shortness of breath and wheezing.   Cardiovascular: Negative for chest pain, palpitations and leg swelling.  Gastrointestinal: Negative for diarrhea, nausea and vomiting.  Musculoskeletal: Negative for myalgias.  Skin: Negative for rash.  Neurological: Positive for headaches. Negative for dizziness.       Observations/Objective: Pt looks well/like her usual self (initially-then visit changed to phone) No facial swelling or skin change or pallor Not distressed Slight hoarse voice Few dry coughs but no shortness of breath  Good historian/ nl cognition  Mood is good -not seemingly anxious or depressed   Assessment and Plan: Problem List Items Addressed This Visit      Respiratory   Viral URI with cough    Mild prod cough with yellow sputum and mild ST Some runny nose  Ordered covid screen-pt going to [employer] site now inst to watch for worse cough or sob  Try expectorant with DM for symptoms and antihistamine for runny nose, tylenol for pain or temp Update if not starting to improve in a week or if worsening  -esp fever/sob/change in taste/smell or GI symptoms  inst to isolate until results return and symptom free She voiced understanding      Relevant Orders   Novel Coronavirus, NAA ([facility])       Follow Up Instructions: Drink fluids and rest  Isolate yourself until results return  and you are symptom free mucinex DM or robitussin dm are good for cough Tylenol for pain or fever Lozenges as needed  Update if not starting to improve in a week or if worsening   Especially if worse cough or any shortness of breath or new symptoms  Go to the [employer] testing site on [address] for covid testing  We will alert you when results return    I discussed the assessment and treatment plan with the patient. The patient was provided an opportunity to ask questions and all were answered. The patient agreed with the plan and demonstrated an understanding of the instructions.   The patient was advised to call back or seek an in-person evaluation if the symptoms worsen or if the condition  fails to improve as anticipated.     Loura Pardon, MD

## 2018-11-07 NOTE — Assessment & Plan Note (Signed)
Mild prod cough with yellow sputum and mild ST Some runny nose  Ordered covid screen-pt going to [employer] site now inst to watch for worse cough or sob  Try expectorant with DM for symptoms and antihistamine for runny nose, tylenol for pain or temp Update if not starting to improve in a week or if worsening  -esp fever/sob/change in taste/smell or GI symptoms  inst to isolate until results return and symptom free She voiced understanding

## 2018-11-08 LAB — NOVEL CORONAVIRUS, NAA: SARS-CoV-2, NAA: NOT DETECTED

## 2018-11-09 ENCOUNTER — Telehealth: Payer: Self-pay | Admitting: *Deleted

## 2018-11-09 ENCOUNTER — Encounter: Payer: Self-pay | Admitting: Family Medicine

## 2018-11-09 NOTE — Telephone Encounter (Signed)
Left VM advising pt Covid test was neg and requested pt to call and update [place] on how she's doing

## 2018-11-09 NOTE — Telephone Encounter (Signed)
Pt sent mychart message with update.

## 2018-11-10 MED ORDER — HYDROCODONE-HOMATROPINE 5-1.5 MG/5ML PO SYRP: 5.0000 mL | ORAL_SOLUTION | Freq: Three times a day (TID) | ORAL | 0 refills | Status: DC | PRN

## 2018-11-22 ENCOUNTER — Telehealth: Payer: Self-pay | Admitting: Oncology

## 2018-11-22 NOTE — Telephone Encounter (Signed)
Moved PT's appt time to later per PT request via phone message and left PT a message with new appt time 11/22/2018

## 2018-11-23 ENCOUNTER — Other Ambulatory Visit: Payer: Self-pay

## 2018-11-24 ENCOUNTER — Inpatient Hospital Stay: Payer: BC Managed Care – PPO | Attending: Oncology

## 2018-11-24 ENCOUNTER — Other Ambulatory Visit: Payer: BC Managed Care – PPO

## 2018-11-24 ENCOUNTER — Other Ambulatory Visit: Payer: Self-pay

## 2018-11-24 DIAGNOSIS — Z79899 Other long term (current) drug therapy: Secondary | ICD-10-CM | POA: Insufficient documentation

## 2018-11-24 DIAGNOSIS — D500 Iron deficiency anemia secondary to blood loss (chronic): Secondary | ICD-10-CM | POA: Diagnosis not present

## 2018-11-24 DIAGNOSIS — I10 Essential (primary) hypertension: Secondary | ICD-10-CM | POA: Insufficient documentation

## 2018-11-24 DIAGNOSIS — D649 Anemia, unspecified: Secondary | ICD-10-CM

## 2018-11-24 LAB — CBC WITH DIFFERENTIAL/PLATELET
Abs Immature Granulocytes: 0.04 10*3/uL (ref 0.00–0.07)
Basophils Absolute: 0.1 10*3/uL (ref 0.0–0.1)
Basophils Relative: 1 %
Eosinophils Absolute: 0.2 10*3/uL (ref 0.0–0.5)
Eosinophils Relative: 2 %
HCT: 40.8 % (ref 36.0–46.0)
Hemoglobin: 12.9 g/dL (ref 12.0–15.0)
Immature Granulocytes: 0 %
Lymphocytes Relative: 24 %
Lymphs Abs: 2.4 10*3/uL (ref 0.7–4.0)
MCH: 26.2 pg (ref 26.0–34.0)
MCHC: 31.6 g/dL (ref 30.0–36.0)
MCV: 82.8 fL (ref 80.0–100.0)
Monocytes Absolute: 0.5 10*3/uL (ref 0.1–1.0)
Monocytes Relative: 5 %
Neutro Abs: 6.8 10*3/uL (ref 1.7–7.7)
Neutrophils Relative %: 68 %
Platelets: 266 10*3/uL (ref 150–400)
RBC: 4.93 MIL/uL (ref 3.87–5.11)
RDW: 24.4 % — ABNORMAL HIGH (ref 11.5–15.5)
WBC: 10 10*3/uL (ref 4.0–10.5)
nRBC: 0 % (ref 0.0–0.2)

## 2018-11-24 LAB — IRON AND TIBC
Iron: 50 ug/dL (ref 28–170)
Saturation Ratios: 15 % (ref 10.4–31.8)
TIBC: 340 ug/dL (ref 250–450)
UIBC: 290 ug/dL

## 2018-11-24 LAB — FERRITIN: Ferritin: 71 ng/mL (ref 11–307)

## 2018-11-24 NOTE — Progress Notes (Signed)
Patient is coming in for follow up. Developed cough 8/24 and tested negative for COVID. She is doing well.

## 2018-11-27 ENCOUNTER — Inpatient Hospital Stay (HOSPITAL_BASED_OUTPATIENT_CLINIC_OR_DEPARTMENT_OTHER): Payer: BC Managed Care – PPO | Admitting: Oncology

## 2018-11-27 ENCOUNTER — Encounter: Payer: Self-pay | Admitting: Oncology

## 2018-11-27 ENCOUNTER — Other Ambulatory Visit: Payer: Self-pay

## 2018-11-27 ENCOUNTER — Inpatient Hospital Stay: Payer: BC Managed Care – PPO

## 2018-11-27 VITALS — BP 144/86 | HR 59 | Temp 98.6°F | Resp 16 | Wt 241.0 lb

## 2018-11-27 DIAGNOSIS — Z79899 Other long term (current) drug therapy: Secondary | ICD-10-CM | POA: Diagnosis not present

## 2018-11-27 DIAGNOSIS — D500 Iron deficiency anemia secondary to blood loss (chronic): Secondary | ICD-10-CM

## 2018-11-27 DIAGNOSIS — I10 Essential (primary) hypertension: Secondary | ICD-10-CM | POA: Diagnosis not present

## 2018-11-27 NOTE — Progress Notes (Signed)
Hematology/Oncology follow up note [facility] Telephone:([phone]) [phone] [phone]   Patient Care Team: Tower, Wynelle Fanny, MD as PCP - General  REFERRING PROVIDER: Tower, Wynelle Fanny, MD CHIEF COMPLAINTS/REASON FOR VISIT:  Evaluation of iron deficiency anemia  HISTORY OF PRESENTING ILLNESS:  Meagan Conway is a  58 y.o.  female with PMH listed below was seen in consultation at the request of Tower, Wynelle Fanny, MD   for evaluation of iron deficiency anemia.   Reviewed patient's recent labs done on 09/26/2018. CBC showed hemoglobin of 10.1, MCV 71.3, platelet 353,000, WBC 9.8, normal differential Ferritin was checked and was decreased at 4.8.    Reviewed patient's previous labs ordered by other physicians, anemia is chronic onset , duration is since 2016. No aggravating or improving factors.  Associated signs and symptoms: Patient reports fatigue.  Reports SOB with exertion.  She also feels very stressed as her son passed away 3 weeks ago.  denies weight loss, easy bruising, hematochezia, hemoptysis, hematuria. Context:  History of iron deficiency: Reports chronic history of iron deficiency.  She has been taking oral iron supplementation for the past year.  Hemoglobin is not improved. Rectal bleeding: Denies Menstrual bleeding/ Vaginal bleeding : Denies Hematemesis or hemoptysis : denies Blood in urine : denies  Last endoscopy:08/29/2017 upper endoscopy showed large hiatal hernia.  Normal stomach. 08/27/2016 colonoscopy showed focal active colitis with ulceration.  Negative for dysplasia. 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 Fatigue: Yes.  SOB: deneis  Denies hematochezia, hematuria, hematemesis, epistaxis, black tarry stool or easy bruising.   INTERVAL HISTORY Meagan Conway is a 58 y.o. female who has above history reviewed by me today presents for follow up visit for management of iron  deficiency anemia Problems and complaints are listed below: Since last visit, patient has received IV Venofer weekly x4 Patient reports that fatigue has improved a lot. No new complaints today.    Review of Systems  Constitutional: Negative for appetite change, chills, fatigue and fever.  HENT:   Negative for hearing loss and voice change.   Eyes: Negative for eye problems.  Respiratory: Negative for chest tightness and cough.   Cardiovascular: Negative for chest pain.  Gastrointestinal: Negative for abdominal distention, abdominal pain and blood in stool.  Endocrine: Negative for hot flashes.  Genitourinary: Negative for difficulty urinating and frequency.   Musculoskeletal: Negative for arthralgias.  Skin: Negative for itching and rash.  Neurological: Negative for extremity weakness.  Hematological: Negative for adenopathy.  Psychiatric/Behavioral: Negative for confusion and depression.    MEDICAL HISTORY:  Past Medical History:  Diagnosis Date  . Anemia   . Depression    with anxious features  . GERD (gastroesophageal reflux disease)   . Hypertension 05/2005   H/O. no meds currently  . IDA (iron deficiency anemia) 10/03/2018  . Obesity   . Vertigo    last episode 03/18    SURGICAL HISTORY: Past Surgical History:  Procedure Laterality Date  . COLONOSCOPY WITH PROPOFOL N/A 08/27/2016   Procedure: COLONOSCOPY WITH PROPOFOL;  Surgeon: Lucilla Lame, MD;  Location: [facility];  Service: Endoscopy;  Laterality: N/A;  . ENDOMETRIAL ABLATION W/ NOVASURE    . ESOPHAGOGASTRODUODENOSCOPY (EGD) WITH PROPOFOL N/A 08/29/2017   Procedure: ESOPHAGOGASTRODUODENOSCOPY (EGD) WITH PROPOFOL;  Surgeon: Lucilla Lame, MD;  Location: [facility];  Service: Endoscopy;  Laterality: N/A;  pt request early arrival time  . GIVENS CAPSULE STUDY N/A 09/28/2017  Procedure: GIVENS CAPSULE STUDY;  Surgeon: Lucilla Lame, MD;  Location[facility] ENDOSCOPY;  Service: Endoscopy;  Laterality:  N/A;  . OVARIAN CYST SURGERY  08/2002  . VESICO-VAGINAL FISTULA REPAIR  1994    SOCIAL HISTORY: Social History   Socioeconomic History  . Marital status: Married    Spouse name: Not on file  . Number of children: Not on file  . Years of education: Not on file  . Highest education level: Not on file  Occupational History  . Not on file  Social Needs  . Financial resource strain: Not on file  . Food insecurity    Worry: Not on file    Inability: Not on file  . Transportation needs    Medical: Not on file    Non-medical: Not on file  Tobacco Use  . Smoking status: Former Smoker    Quit date: 2014    Years since quitting: 6.7  . Smokeless tobacco: Never Used  . Tobacco comment: quit many years ago.  Substance and Sexual Activity  . Alcohol use: Yes    Alcohol/week: 5.0 standard drinks    Types: 2 Glasses of wine, 3 Cans of beer per week    Comment: occ  . Drug use: No  . Sexual activity: Yes    Partners: Male    Birth control/protection: None    Comment: husband has a vasectomy  Lifestyle  . Physical activity    Days per week: Not on file    Minutes per session: Not on file  . Stress: Not on file  Relationships  . Social Herbalist on phone: Not on file    Gets together: Not on file    Attends religious service: Not on file    Active member of club or organization: Not on file    Attends meetings of clubs or organizations: Not on file    Relationship status: Not on file  . Intimate partner violence    Fear of current or ex partner: Not on file    Emotionally abused: Not on file    Physically abused: Not on file    Forced sexual activity: Not on file  Other Topics Concern  . Not on file  Social History Narrative  . Not on file    FAMILY HISTORY: Family History  Problem Relation Age of Onset  . Hypertension Mother        died of GI bleed-unexpected   . Hypertension Father   . Heart disease Father 74       MI  . Alcohol abuse Father   .  Heart disease Maternal Grandmother   . Heart disease Paternal Grandmother     ALLERGIES:  has No Known Allergies.  MEDICATIONS:  Current Outpatient Medications  Medication Sig Dispense Refill  . FLUoxetine (PROZAC) 20 MG capsule Take 1 capsule (20 mg total) by mouth daily. 90 capsule 1  . pantoprazole (PROTONIX) 40 MG tablet Take 1 tablet (40 mg total) by mouth daily. 90 tablet 1  . HYDROcodone-homatropine (HYCODAN) 5-1.5 MG/5ML syrup Take 5 mLs by mouth every 8 (eight) hours as needed for cough. (Patient not taking: Reported on 11/24/2018) 120 mL 0  . iron polysaccharides (NIFEREX) 150 MG capsule Take 1 capsule (150 mg total) by mouth 2 (two) times daily. (Patient not taking: Reported on 11/07/2018) 180 capsule 3   No current facility-administered medications for this visit.      PHYSICAL EXAMINATION: ECOG PERFORMANCE STATUS: 1 - Symptomatic but completely  ambulatory Vitals:   11/27/18 1304  BP: (!) 144/86  Pulse: (!) 59  Resp: 16  Temp: 98.6 F (37 C)   Filed Weights   11/27/18 1304  Weight: 241 lb (109.3 kg)    Physical Exam Constitutional:      General: She is not in acute distress. HENT:     Head: Normocephalic and atraumatic.  Eyes:     General: No scleral icterus.    Pupils: Pupils are equal, round, and reactive to light.  Neck:     Musculoskeletal: Normal range of motion and neck supple.  Cardiovascular:     Rate and Rhythm: Normal rate and regular rhythm.     Heart sounds: Normal heart sounds.  Pulmonary:     Effort: Pulmonary effort is normal. No respiratory distress.     Breath sounds: No wheezing.  Abdominal:     General: Bowel sounds are normal. There is no distension.     Palpations: Abdomen is soft. There is no mass.     Tenderness: There is no abdominal tenderness.  Musculoskeletal: Normal range of motion.        General: No deformity.  Skin:    General: Skin is warm and dry.     Findings: No erythema or rash.  Neurological:     Mental Status:  She is alert and oriented to person, place, and time.     Cranial Nerves: No cranial nerve deficit.     Coordination: Coordination normal.  Psychiatric:        Behavior: Behavior normal.        Thought Content: Thought content normal.       CMP Latest Ref Rng & Units 07/19/2018  Glucose 70 - 99 mg/dL 109(H)  BUN 6 - 23 mg/dL 17  Creatinine 0.40 - 1.20 mg/dL 0.95  Sodium 135 - 145 mEq/L 140  Potassium 3.5 - 5.1 mEq/L 4.3  Chloride 96 - 112 mEq/L 105  CO2 19 - 32 mEq/L 26  Calcium 8.4 - 10.5 mg/dL 8.9  Total Protein 6.0 - 8.3 g/dL 6.7  Total Bilirubin 0.2 - 1.2 mg/dL 0.3  Alkaline Phos 39 - 117 U/L 76  AST 0 - 37 U/L 15  ALT 0 - 35 U/L 14   CBC Latest Ref Rng & Units 11/24/2018  WBC 4.0 - 10.5 K/uL 10.0  Hemoglobin 12.0 - 15.0 g/dL 12.9  Hematocrit 36.0 - 46.0 % 40.8  Platelets 150 - 400 K/uL 266     LABORATORY DATA:  I have reviewed the data as listed Lab Results  Component Value Date   WBC 10.0 11/24/2018   HGB 12.9 11/24/2018   HCT 40.8 11/24/2018   MCV 82.8 11/24/2018   PLT 266 11/24/2018   Recent Labs    07/19/18 0849  NA 140  K 4.3  CL 105  CO2 26  GLUCOSE 109*  BUN 17  CREATININE 0.95  CALCIUM 8.9  PROT 6.7  ALBUMIN 4.1  AST 15  ALT 14  ALKPHOS 76  BILITOT 0.3   Iron/TIBC/Ferritin/ %Sat    Component Value Date/Time   IRON 50 11/24/2018 1436   TIBC 340 11/24/2018 1436   FERRITIN 71 11/24/2018 1436   IRONPCTSAT 15 11/24/2018 1436     No results found.    ASSESSMENT & PLAN:  1. Iron deficiency anemia due to chronic blood loss    Labs are reviewed and discussed with patient.  Hemoglobin has normalized to 12.9. Iron panel also significant improved.  Ferritin has improved  from 4 to 22. Hold additional IV Venofer at this point.  Patient can go back on Niferex 150 mg daily as maintenance. Continue follow-up with gastroenterology. Follow-up in 4 months with repeat blood work and evaluation.  Orders Placed This Encounter  Procedures  .  CBC with Differential/Platelet    Standing Status:   Future    Standing Expiration Date:   11/27/2019  . Ferritin    Standing Status:   Future    Standing Expiration Date:   11/27/2019  . Iron and TIBC    Standing Status:   Future    Standing Expiration Date:   11/27/2019    All questions were answered. The patient knows to call the clinic with any problems questions or concerns.  Cc Tower, Wynelle Fanny, MD  Return of visit: 4 months.  We spent sufficient time to discuss many aspect of care, questions were answered to patient's satisfaction. Total face to face encounter time for this patient visit was 15 min. >50% of the time was  spent in counseling and coordination of care.    Earlie Server, MD, PhD Hematology Oncology [facility] Pager- [phone] 11/27/2018

## 2019-01-02 ENCOUNTER — Other Ambulatory Visit: Payer: Self-pay | Admitting: *Deleted

## 2019-01-02 DIAGNOSIS — Z01419 Encounter for gynecological examination (general) (routine) without abnormal findings: Secondary | ICD-10-CM

## 2019-01-02 DIAGNOSIS — Z1231 Encounter for screening mammogram for malignant neoplasm of breast: Secondary | ICD-10-CM

## 2019-01-08 ENCOUNTER — Encounter: Payer: Self-pay | Admitting: Radiology

## 2019-01-08 ENCOUNTER — Encounter: Payer: Self-pay | Admitting: Obstetrics & Gynecology

## 2019-01-08 ENCOUNTER — Other Ambulatory Visit: Payer: Self-pay

## 2019-01-08 ENCOUNTER — Ambulatory Visit (INDEPENDENT_AMBULATORY_CARE_PROVIDER_SITE_OTHER): Payer: BC Managed Care – PPO | Admitting: Obstetrics & Gynecology

## 2019-01-08 VITALS — BP 145/95 | HR 72 | Wt 240.2 lb

## 2019-01-08 DIAGNOSIS — Z01419 Encounter for gynecological examination (general) (routine) without abnormal findings: Secondary | ICD-10-CM

## 2019-01-08 DIAGNOSIS — Z124 Encounter for screening for malignant neoplasm of cervix: Secondary | ICD-10-CM

## 2019-01-08 DIAGNOSIS — Z1151 Encounter for screening for human papillomavirus (HPV): Secondary | ICD-10-CM

## 2019-01-08 DIAGNOSIS — Z1231 Encounter for screening mammogram for malignant neoplasm of breast: Secondary | ICD-10-CM | POA: Diagnosis not present

## 2019-01-08 NOTE — Progress Notes (Signed)
Subjective:    Meagan Conway is a 58 y.o. P2 (3 grands) who presents for an annual exam. The patient has no complaints today. The patient is sexually active. GYN screening history: last pap: was normal. The patient wears seatbelts: yes. The patient participates in regular exercise: no. Has the patient ever been transfused or tattooed?: yes. (eyeliner) The patient reports that there is not domestic violence in her life.   Menstrual History: OB History    Gravida  2   Para  2   Term  2   Preterm      AB      Living  2     SAB      TAB      Ectopic      Multiple      Live Births  2           Menarche age: 30 No LMP recorded. Patient has had an ablation.    The following portions of the patient's history were reviewed and updated as appropriate: allergies, current medications, past family history, past medical history, past social history, past surgical history and problem list.  Review of Systems Pertinent items are noted in HPI.   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 Glori Bickers does her blood work Working in person for [employer] She had an ablation in 2010, no bleeding since    Objective:    BP (!) 145/95   Pulse 72   Wt 240 lb 3.2 oz (109 kg)   BMI 38.83 kg/m   General Appearance:    Alert, cooperative, no distress, appears stated age  Head:    Normocephalic, without obvious abnormality, atraumatic  Eyes:    PERRL, conjunctiva/corneas clear, EOM's intact, fundi    benign, both eyes  Ears:    Normal TM's and external ear canals, both ears  Nose:   Nares normal, septum midline, mucosa normal, no drainage    or sinus tenderness  Throat:   Lips, mucosa, and tongue normal; teeth and gums normal  Neck:   Supple, symmetrical, trachea midline, no adenopathy;    thyroid:  no enlargement/tenderness/nodules;  no carotid   bruit or JVD  Back:     Symmetric, no curvature, ROM normal, no CVA tenderness  Lungs:     Clear to auscultation bilaterally, respirations unlabored  Chest Wall:    No tenderness or deformity   Heart:    Regular rate and rhythm, S1 and S2 normal, no murmur, rub   or gallop  Breast Exam:    No tenderness, masses, or nipple abnormality  Abdomen:     Soft, non-tender, bowel sounds active all four quadrants,    no masses, no organomegaly  Genitalia:    Normal female without lesion, discharge or tenderness     Extremities:   Extremities normal, atraumatic, no cyanosis or edema  Pulses:   2+ and symmetric all extremities  Skin:   Skin color, texture, turgor normal, no rashes or lesions  Lymph nodes:   Cervical, supraclavicular, and axillary nodes normal  Neurologic:   CNII-XII intact, normal strength, sensation and reflexes    throughout  .    Assessment:    Healthy female exam.    Plan:     Thin prep Pap smear. with cotesting

## 2019-01-08 NOTE — Progress Notes (Signed)
Mammogram ordered on 01/02/2019 Last pap 09/2017- normal

## 2019-01-11 LAB — CYTOLOGY - PAP
Comment: NEGATIVE
Diagnosis: NEGATIVE
High risk HPV: NEGATIVE

## 2019-02-02 ENCOUNTER — Telehealth: Payer: Self-pay

## 2019-02-02 NOTE — Telephone Encounter (Signed)
Patient contacted office in regards to scheduling an office visit.  She states she has experienced running loose stool.  An office visit has been made for her in January with Dr. Allen Norris.  However, I will check with Ginger to see if she can be seen sooner than January.  Thanks,  Sharyn Lull

## 2019-02-06 ENCOUNTER — Other Ambulatory Visit: Payer: Self-pay | Admitting: Family Medicine

## 2019-03-22 ENCOUNTER — Ambulatory Visit (INDEPENDENT_AMBULATORY_CARE_PROVIDER_SITE_OTHER): Payer: BC Managed Care – PPO | Admitting: Gastroenterology

## 2019-03-22 ENCOUNTER — Encounter: Payer: Self-pay | Admitting: Gastroenterology

## 2019-03-22 ENCOUNTER — Other Ambulatory Visit: Payer: Self-pay

## 2019-03-22 VITALS — BP 149/84 | HR 66 | Temp 98.1°F | Ht 65.0 in | Wt 244.2 lb

## 2019-03-22 DIAGNOSIS — R194 Change in bowel habit: Secondary | ICD-10-CM

## 2019-03-22 DIAGNOSIS — R1013 Epigastric pain: Secondary | ICD-10-CM | POA: Diagnosis not present

## 2019-03-22 NOTE — Progress Notes (Signed)
Primary Care Physician: Tower, Wynelle Fanny, MD  Primary Gastroenterologist:  Dr. Lucilla Lame  No chief complaint on file.   HPI: Meagan Conway is a 59 y.o. female here for report of diarrhea.  The patient is seen in the past for iron deficiency anemia and had a colonoscopy with ulcerations found which were biopsied and showed acute active inflammation but no sign of chronicity.  The patient also had an upper endoscopy for the anemia without any cause of the bleeding seen.  She underwent a capsule endoscopy which showed a nonbleeding AVM in the small bowel and was sent to [facility] for a double-balloon enteroscopy which was unable to locate the AVM.  The patient has had a stable hemoglobin but now comes due to a report of a change in bowel habits with the report of diarrhea.  The patient reports that her diarrhea is usually about 5 times a day with most of the occurrences having been noticed after breakfast and after lunch.  She is usually okay throughout the rest of the day.  There is no diarrhea that wakes her up in the melanite.  She also denies any abdominal pain or unexplained weight loss.  The patient does state that the diarrhea can be explosive at times.  She denies any black stools or bloody stools.  The patient also denies any dairy intake with only the occasional cheese intake.  The diarrhea has been present for approximately 1 year. The patient has also reported that she is having epigastric pain that happens also with eating.  She has a history of hiatal hernia and is wondering if the hiatal hernia can be causing her symptoms.  Current Outpatient Medications  Medication Sig Dispense Refill  . FLUoxetine (PROZAC) 20 MG capsule TAKE 1 CAPSULE DAILY 90 capsule 1  . HYDROcodone-homatropine (HYCODAN) 5-1.5 MG/5ML syrup Take 5 mLs by mouth every 8 (eight) hours as needed for cough. (Patient not taking: Reported on 11/24/2018) 120 mL 0  . iron polysaccharides (NIFEREX) 150 MG capsule Take 1  capsule (150 mg total) by mouth 2 (two) times daily. 180 capsule 3  . pantoprazole (PROTONIX) 40 MG tablet TAKE 1 TABLET DAILY 90 tablet 1   No current facility-administered medications for this visit.    Allergies as of 03/22/2019  . (No Known Allergies)    ROS:  General: Negative for anorexia, weight loss, fever, chills, fatigue, weakness. ENT: Negative for hoarseness, difficulty swallowing , nasal congestion. CV: Negative for chest pain, angina, palpitations, dyspnea on exertion, peripheral edema.  Respiratory: Negative for dyspnea at rest, dyspnea on exertion, cough, sputum, wheezing.  GI: See history of present illness. GU:  Negative for dysuria, hematuria, urinary incontinence, urinary frequency, nocturnal urination.  Endo: Negative for unusual weight change.    Physical Examination:   There were no vitals taken for this visit.  General: Well-nourished, well-developed in no acute distress.  Eyes: No icterus. Conjunctivae pink. Lungs: Clear to auscultation bilaterally. Non-labored. Heart: Regular rate and rhythm, no murmurs rubs or gallops.  Abdomen: Bowel sounds are normal, nontender, nondistended, no hepatosplenomegaly or masses, no abdominal bruits or hernia , no rebound or guarding.   Extremities: No lower extremity edema. No clubbing or deformities. Neuro: Alert and oriented x 3.  Grossly intact. Skin: Warm and dry, no jaundice.   Psych: Alert and cooperative, normal mood and affect.  Labs:    Imaging Studies: No results found.  Assessment and Plan:   Meagan Conway is a 58 y.o.  y/o female who comes in with a change in bowel habits with diarrhea for the last year.  She reports that the diarrhea is explosive and usually in the morning and may happen after lunch at work.  The patient has been told that this may be related to something she is eating and has been told to avoid artificial sweeteners and keep a food log to see if there is anything that is causing her to  have worse symptoms in regards to her food intake.  The patient has also been told to start Citrucel before she goes to sleep at night to help with the morning diarrhea and add Imodium before lunch if she continues to have diarrhea.  The patient has also been told to double up on her Protonix to see if that helps her epigastric discomfort.  If her symptoms do not improve she has been told to contact me and she may need a EGD and or colonoscopy to look for the cause of her epigastric pain and change in bowel habits.  The patient has been explained the plan and agrees with it.      Lucilla Lame, MD. [name]    Note: This dictation was prepared with Dragon dictation along with smaller phrase technology. Any transcriptional errors that result from this process are unintentional.

## 2019-03-23 ENCOUNTER — Inpatient Hospital Stay: Payer: BC Managed Care – PPO | Attending: Oncology

## 2019-03-23 ENCOUNTER — Other Ambulatory Visit: Payer: Self-pay

## 2019-03-23 DIAGNOSIS — D500 Iron deficiency anemia secondary to blood loss (chronic): Secondary | ICD-10-CM | POA: Insufficient documentation

## 2019-03-23 LAB — CBC WITH DIFFERENTIAL/PLATELET
Abs Immature Granulocytes: 0.05 10*3/uL (ref 0.00–0.07)
Basophils Absolute: 0.1 10*3/uL (ref 0.0–0.1)
Basophils Relative: 1 %
Eosinophils Absolute: 0.2 10*3/uL (ref 0.0–0.5)
Eosinophils Relative: 2 %
HCT: 41.9 % (ref 36.0–46.0)
Hemoglobin: 13.3 g/dL (ref 12.0–15.0)
Immature Granulocytes: 1 %
Lymphocytes Relative: 27 %
Lymphs Abs: 2.5 10*3/uL (ref 0.7–4.0)
MCH: 30.4 pg (ref 26.0–34.0)
MCHC: 31.7 g/dL (ref 30.0–36.0)
MCV: 95.9 fL (ref 80.0–100.0)
Monocytes Absolute: 0.6 10*3/uL (ref 0.1–1.0)
Monocytes Relative: 6 %
Neutro Abs: 6 10*3/uL (ref 1.7–7.7)
Neutrophils Relative %: 63 %
Platelets: 278 10*3/uL (ref 150–400)
RBC: 4.37 MIL/uL (ref 3.87–5.11)
RDW: 13.5 % (ref 11.5–15.5)
WBC: 9.3 10*3/uL (ref 4.0–10.5)
nRBC: 0 % (ref 0.0–0.2)

## 2019-03-23 LAB — IRON AND TIBC
Iron: 50 ug/dL (ref 28–170)
Saturation Ratios: 14 % (ref 10.4–31.8)
TIBC: 371 ug/dL (ref 250–450)
UIBC: 321 ug/dL

## 2019-03-23 LAB — FERRITIN: Ferritin: 18 ng/mL (ref 11–307)

## 2019-03-26 ENCOUNTER — Inpatient Hospital Stay: Payer: BC Managed Care – PPO

## 2019-03-26 ENCOUNTER — Inpatient Hospital Stay (HOSPITAL_BASED_OUTPATIENT_CLINIC_OR_DEPARTMENT_OTHER): Payer: BC Managed Care – PPO | Admitting: Oncology

## 2019-03-26 ENCOUNTER — Other Ambulatory Visit: Payer: Self-pay

## 2019-03-26 ENCOUNTER — Encounter: Payer: Self-pay | Admitting: Oncology

## 2019-03-26 DIAGNOSIS — D500 Iron deficiency anemia secondary to blood loss (chronic): Secondary | ICD-10-CM | POA: Diagnosis not present

## 2019-03-26 NOTE — Progress Notes (Signed)
HEMATOLOGY-ONCOLOGY TeleHEALTH VISIT PROGRESS NOTE  I connected with Meagan Conway on 03/26/19 at  9:45 AM EST by video enabled telemedicine visit and verified that I am speaking with the correct person using two identifiers. I discussed the limitations, risks, security and privacy concerns of performing an evaluation and management service by telemedicine and the availability of in-person appointments. I also discussed with the patient that there may be a patient responsible charge related to this service. The patient expressed understanding and agreed to proceed.   Other persons participating in the visit and their role in the encounter:  None  Patient's location: work Provider's location: office Chief Complaint: Iron deficiency anemia   INTERVAL HISTORY Meagan Conway is a 59 y.o. female who has above history reviewed by me today presents for follow up visit for management of iron deficiency anemia Problems and complaints are listed below:  Patient reports feeling well.  She continues on niferex 100 mg daily as maintenance.  No new complaints.  Review of Systems  Constitutional: Negative for appetite change, chills, fatigue and fever.  HENT:   Negative for hearing loss and voice change.   Eyes: Negative for eye problems.  Respiratory: Negative for chest tightness and cough.   Cardiovascular: Negative for chest pain.  Gastrointestinal: Negative for abdominal distention, abdominal pain and blood in stool.  Endocrine: Negative for hot flashes.  Genitourinary: Negative for difficulty urinating and frequency.   Musculoskeletal: Negative for arthralgias.  Skin: Negative for itching and rash.  Neurological: Negative for extremity weakness.  Hematological: Negative for adenopathy.  Psychiatric/Behavioral: Negative for confusion.    Past Medical History:  Diagnosis Date  . Anemia   . Depression    with anxious features  . GERD (gastroesophageal reflux disease)   . Hypertension  05/2005   H/O. no meds currently  . IDA (iron deficiency anemia) 10/03/2018  . Obesity   . Vertigo    last episode 03/18   Past Surgical History:  Procedure Laterality Date  . COLONOSCOPY WITH PROPOFOL N/A 08/27/2016   Procedure: COLONOSCOPY WITH PROPOFOL;  Surgeon: Lucilla Lame, MD;  Location: [place];  Service: Endoscopy;  Laterality: N/A;  . ENDOMETRIAL ABLATION W/ NOVASURE    . ESOPHAGOGASTRODUODENOSCOPY (EGD) WITH PROPOFOL N/A 08/29/2017   Procedure: ESOPHAGOGASTRODUODENOSCOPY (EGD) WITH PROPOFOL;  Surgeon: Lucilla Lame, MD;  Location: [place];  Service: Endoscopy;  Laterality: N/A;  pt request early arrival time  . GIVENS CAPSULE STUDY N/A 09/28/2017   Procedure: GIVENS CAPSULE STUDY;  Surgeon: Lucilla Lame, MD;  Location: [facility] ENDOSCOPY;  Service: Endoscopy;  Laterality: N/A;  . OVARIAN CYST SURGERY  08/2002  . VESICO-VAGINAL FISTULA REPAIR  1994    Family History  Problem Relation Age of Onset  . Hypertension Mother        died of GI bleed-unexpected   . Hypertension Father   . Heart disease Father 56       MI  . Alcohol abuse Father   . Heart disease Maternal Grandmother   . Heart disease Paternal Grandmother     Social History   Socioeconomic History  . Marital status: Married    Spouse name: Not on file  . Number of children: Not on file  . Years of education: Not on file  . Highest education level: Not on file  Occupational History  . Not on file  Tobacco Use  . Smoking status: Former Smoker    Quit date: 2014    Years since quitting: 7.0  .  Smokeless tobacco: Never Used  . Tobacco comment: quit many years ago.  Substance and Sexual Activity  . Alcohol use: Yes    Alcohol/week: 5.0 standard drinks    Types: 2 Glasses of wine, 3 Cans of beer per week    Comment: occ  . Drug use: No  . Sexual activity: Yes    Partners: Male    Birth control/protection: None    Comment: husband has a vasectomy  Other Topics Concern  . Not on file   Social History Narrative  . Not on file   Social Determinants of Health   Financial Resource Strain:   . Difficulty of Paying Living Expenses: Not on file  Food Insecurity:   . Worried About Charity fundraiser in the Last Year: Not on file  . Ran Out of Food in the Last Year: Not on file  Transportation Needs:   . Lack of Transportation (Medical): Not on file  . Lack of Transportation (Non-Medical): Not on file  Physical Activity:   . Days of Exercise per Week: Not on file  . Minutes of Exercise per Session: Not on file  Stress:   . Feeling of Stress : Not on file  Social Connections:   . Frequency of Communication with Friends and Family: Not on file  . Frequency of Social Gatherings with Friends and Family: Not on file  . Attends Religious Services: Not on file  . Active Member of Clubs or Organizations: Not on file  . Attends Archivist Meetings: Not on file  . Marital Status: Not on file  Intimate Partner Violence:   . Fear of Current or Ex-Partner: Not on file  . Emotionally Abused: Not on file  . Physically Abused: Not on file  . Sexually Abused: Not on file    Current Outpatient Medications on File Prior to Visit  Medication Sig Dispense Refill  . FLUoxetine (PROZAC) 20 MG capsule TAKE 1 CAPSULE DAILY 90 capsule 1  . iron polysaccharides (NIFEREX) 150 MG capsule Take 1 capsule (150 mg total) by mouth 2 (two) times daily. 180 capsule 3  . methylcellulose (CITRUCEL) oral powder Take by mouth daily.    . pantoprazole (PROTONIX) 40 MG tablet TAKE 1 TABLET DAILY 90 tablet 1   No current facility-administered medications on file prior to visit.    No Known Allergies     Observations/Objective: Today's Vitals   03/26/19 0944  PainSc: 0-No pain   There is no height or weight on file to calculate BMI.  Physical Exam  Constitutional: She is oriented to person, place, and time. No distress.  Neurological: She is alert and oriented to person, place, and  time.  Psychiatric: Mood normal.    CBC    Component Value Date/Time   WBC 9.3 03/23/2019 1446   RBC 4.37 03/23/2019 1446   HGB 13.3 03/23/2019 1446   HCT 41.9 03/23/2019 1446   PLT 278 03/23/2019 1446   MCV 95.9 03/23/2019 1446   MCH 30.4 03/23/2019 1446   MCHC 31.7 03/23/2019 1446   RDW 13.5 03/23/2019 1446   LYMPHSABS 2.5 03/23/2019 1446   MONOABS 0.6 03/23/2019 1446   EOSABS 0.2 03/23/2019 1446   BASOSABS 0.1 03/23/2019 1446    CMP     Component Value Date/Time   NA 140 07/19/2018 0849   K 4.3 07/19/2018 0849   CL 105 07/19/2018 0849   CO2 26 07/19/2018 0849   GLUCOSE 109 (H) 07/19/2018 0849   BUN 17 07/19/2018  0849   CREATININE 0.95 07/19/2018 0849   CALCIUM 8.9 07/19/2018 0849   PROT 6.7 07/19/2018 0849   ALBUMIN 4.1 07/19/2018 0849   AST 15 07/19/2018 0849   ALT 14 07/19/2018 0849   ALKPHOS 76 07/19/2018 0849   BILITOT 0.3 07/19/2018 0849   GFRNONAA >60 06/09/2016 0446   GFRAA >60 06/09/2016 0446     Assessment and Plan: 1. Iron deficiency anemia due to chronic blood loss    CBC showed normal hemoglobin. Iron panel showed stable iron store.  Iron saturation remained stable.  Ferritin 18, decreased from 11/24/2018 level.Continue oral iron supplementation. Hold additional IV iron treatment.    Follow Up Instructions: No need for follow-up at this point.  Patient will be discharged.  Advised patient to call or be referred back to [place] if additional symptoms or concerns  I discussed the assessment and treatment plan with the patient. The patient was provided an opportunity to ask questions and all were answered. The patient agreed with the plan and demonstrated an understanding of the instructions.  The patient was advised to call back or seek an in-person evaluation if the symptoms worsen or if the condition fails to improve as anticipated.   Earlie Server, MD 03/26/2019 8:08 PM

## 2019-03-26 NOTE — Progress Notes (Signed)
Patient verified using two identifiers for virtual visit via telephone today.  Patient does not offer any problems today.  

## 2019-04-03 ENCOUNTER — Ambulatory Visit: Payer: BC Managed Care – PPO | Admitting: Gastroenterology

## 2019-04-16 DIAGNOSIS — U071 COVID-19: Secondary | ICD-10-CM | POA: Diagnosis not present

## 2019-04-16 DIAGNOSIS — Z20822 Contact with and (suspected) exposure to covid-19: Secondary | ICD-10-CM | POA: Diagnosis not present

## 2019-08-05 ENCOUNTER — Other Ambulatory Visit: Payer: Self-pay | Admitting: Family Medicine

## 2019-08-06 NOTE — Telephone Encounter (Signed)
Please schedule PE and refill until then  

## 2019-08-06 NOTE — Telephone Encounter (Signed)
It's been a year since last CPE and no future appts., last OV was a doxy for ?UTI on 11/07/18, please advise

## 2019-08-07 NOTE — Telephone Encounter (Signed)
Med refilled once and [facility] will reach out and try and get CPE scheduled.

## 2019-08-17 ENCOUNTER — Other Ambulatory Visit: Payer: Self-pay

## 2019-08-17 ENCOUNTER — Encounter: Payer: Self-pay | Admitting: Family Medicine

## 2019-08-17 ENCOUNTER — Ambulatory Visit (INDEPENDENT_AMBULATORY_CARE_PROVIDER_SITE_OTHER): Payer: BC Managed Care – PPO | Admitting: Family Medicine

## 2019-08-17 VITALS — BP 148/90 | HR 70 | Temp 96.9°F | Ht 64.25 in | Wt 247.1 lb

## 2019-08-17 DIAGNOSIS — I10 Essential (primary) hypertension: Secondary | ICD-10-CM | POA: Diagnosis not present

## 2019-08-17 DIAGNOSIS — R7303 Prediabetes: Secondary | ICD-10-CM

## 2019-08-17 DIAGNOSIS — Z0000 Encounter for general adult medical examination without abnormal findings: Secondary | ICD-10-CM | POA: Diagnosis not present

## 2019-08-17 DIAGNOSIS — F341 Dysthymic disorder: Secondary | ICD-10-CM | POA: Diagnosis not present

## 2019-08-17 DIAGNOSIS — D509 Iron deficiency anemia, unspecified: Secondary | ICD-10-CM

## 2019-08-17 MED ORDER — POLYSACCHARIDE IRON COMPLEX 150 MG PO CAPS: 150.0000 mg | ORAL_CAPSULE | Freq: Every day | ORAL | 3 refills | Status: DC

## 2019-08-17 MED ORDER — FLUOXETINE HCL 20 MG PO CAPS: 20.0000 mg | ORAL_CAPSULE | Freq: Every day | ORAL | 3 refills | Status: DC

## 2019-08-17 MED ORDER — LISINOPRIL 10 MG PO TABS
10.0000 mg | ORAL_TABLET | Freq: Every day | ORAL | 3 refills | Status: DC
Start: 2019-08-17 — End: 2020-07-24

## 2019-08-17 MED ORDER — PANTOPRAZOLE SODIUM 40 MG PO TBEC: 40.0000 mg | DELAYED_RELEASE_TABLET | Freq: Every day | ORAL | 3 refills | Status: DC

## 2019-08-17 NOTE — Patient Instructions (Addendum)
Think about starting exercise (walking) - it is good mentally /physically   Take lisinopril once daily for blood pressure   Labs today   Stop up front- we will send for your pap report   Take care of yourself

## 2019-08-17 NOTE — Progress Notes (Signed)
Subjective:    Patient ID: Meagan Conway, female    DOB: [birth year], 59 y.o.   MRN: [id number]  This visit occurred during the SARS-CoV-2 public health emergency.  Safety protocols were in place, including screening questions prior to the visit, additional usage of staff PPE, and extensive cleaning of exam room while observing appropriate contact time as indicated for disinfecting solutions.    HPI Here for health maintenance exam and to review chronic medical problems    Wt Readings from Last 3 Encounters:  08/17/19 247 lb 2 oz (112.1 kg)  03/22/19 244 lb 3.2 oz (110.8 kg)  01/08/19 240 lb 3.2 oz (109 kg)   42.09 kg/m  Lost her son this year Just had a grandchild 2 weeks ago    covid status -had covid in February  Unsure if she will get immunized   Mammogram was 01/08/19 at [place] breast exam -no lumps   report EXAM: MAMMO SCREENING BILATERAL DATE: 01/08/2019 11:38 AM ACCESSION: [id number] UN DICTATED: 01/08/2019 1:37 PM INTERPRETATION LOCATION: [facility]  CLINICAL INDICATION: 60 years old Female: SCREENING MAMMOGRAM - Z12.31 - Encounter for screening mammogram for malignant neoplasm of breast   COMPARISON: 2019, 2018 and 2017   TECHNIQUE: Bilateral full-field digital mammography, MLO and CC projections  BREAST DENSITY: a - The breasts are almost entirely fatty.  FINDINGS: There are no suspicious masses, malignant calcifications, sites of architectural distortion, or concerning asymmetries in either breast.  ASSESSMENT: BI-RADS Category: 1-Mammo1Yr : Negative. Mammogram due in 1 year.  Recommendation Laterality: Both  Annual routine screening mammogram is recommended.    Gyn care - goes to gyn at [facility]/had a pap last fall with Dr Hulan Fray    Flu shot- had one last fall  Tdap 10/16  Colonoscopy 6/18 with 10 y recall   bp was elevated on first check today  BP Readings from Last 3 Encounters:  08/17/19 (!) 148/90  03/22/19 (!)  149/84  01/08/19 (!) 145/95   She has family history  Was on bp med in the past  Not watching her diet and not ready yet    Is interested in walking   Pulse Readings from Last 3 Encounters:  08/17/19 70  03/22/19 66  01/08/19 72   Has had IV iron for iron def Sees hematology  On oral iron  now   (taking one daily)  Lab Results  Component Value Date   WBC 9.3 03/23/2019   HGB 13.3 03/23/2019   HCT 41.9 03/23/2019   MCV 95.9 03/23/2019   PLT 278 03/23/2019    Patient Active Problem List   Diagnosis Date Noted  . IDA (iron deficiency anemia) 10/03/2018  . Special screening for malignant neoplasms, colon   . Ulceration of intestine   . Iron deficiency anemia 12/29/2014  . Colon cancer screening 07/08/2014  . Prediabetes 07/04/2013  . Routine general medical examination at a health care facility 06/04/2011  . GERD (gastroesophageal reflux disease) 03/10/2011  . Morbid obesity ([place]) 03/10/2011  . COLONIC POLYPS 04/22/2010  . ANXIETY DEPRESSION 02/28/2008  . Hypertension 01/15/2008   Past Medical History:  Diagnosis Date  . Anemia   . Depression    with anxious features  . GERD (gastroesophageal reflux disease)   . Hypertension 05/2005   H/O. no meds currently  . IDA (iron deficiency anemia) 10/03/2018  . Obesity   . Vertigo    last episode 03/18   Past Surgical History:  Procedure Laterality Date  . COLONOSCOPY  WITH PROPOFOL N/A 08/27/2016   Procedure: COLONOSCOPY WITH PROPOFOL;  Surgeon: Lucilla Lame, MD;  Location: [place];  Service: Endoscopy;  Laterality: N/A;  . ENDOMETRIAL ABLATION W/ NOVASURE    . ESOPHAGOGASTRODUODENOSCOPY (EGD) WITH PROPOFOL N/A 08/29/2017   Procedure: ESOPHAGOGASTRODUODENOSCOPY (EGD) WITH PROPOFOL;  Surgeon: Lucilla Lame, MD;  Location: [place];  Service: Endoscopy;  Laterality: N/A;  pt request early arrival time  . GIVENS CAPSULE STUDY N/A 09/28/2017   Procedure: GIVENS CAPSULE STUDY;  Surgeon: Lucilla Lame, MD;   Location: [facility] ENDOSCOPY;  Service: Endoscopy;  Laterality: N/A;  . OVARIAN CYST SURGERY  08/2002  . VESICO-VAGINAL FISTULA REPAIR  1994   Social History   Tobacco Use  . Smoking status: Former Smoker    Quit date: 2014    Years since quitting: 7.4  . Smokeless tobacco: Never Used  . Tobacco comment: quit many years ago.  Substance Use Topics  . Alcohol use: Yes    Alcohol/week: 5.0 standard drinks    Types: 2 Glasses of wine, 3 Cans of beer per week    Comment: occ  . Drug use: No   Family History  Problem Relation Age of Onset  . Hypertension Mother        died of GI bleed-unexpected   . Hypertension Father   . Heart disease Father 83       MI  . Alcohol abuse Father   . Heart disease Maternal Grandmother   . Heart disease Paternal Grandmother    No Known Allergies Current Outpatient Medications on File Prior to Visit  Medication Sig Dispense Refill  . methylcellulose (CITRUCEL) oral powder Take by mouth daily.     No current facility-administered medications on file prior to visit.    Review of Systems  Constitutional: Negative for activity change, appetite change, fatigue, fever and unexpected weight change.  HENT: Negative for congestion, ear pain, rhinorrhea, sinus pressure and sore throat.   Eyes: Negative for pain, redness and visual disturbance.  Respiratory: Negative for cough, shortness of breath and wheezing.   Cardiovascular: Negative for chest pain and palpitations.  Gastrointestinal: Negative for abdominal pain, blood in stool, constipation and diarrhea.  Endocrine: Negative for polydipsia and polyuria.  Genitourinary: Negative for dysuria, frequency and urgency.  Musculoskeletal: Negative for arthralgias, back pain and myalgias.  Skin: Negative for pallor and rash.  Allergic/Immunologic: Negative for environmental allergies.  Neurological: Negative for dizziness, syncope and headaches.  Hematological: Negative for adenopathy. Does not bruise/bleed  easily.  Psychiatric/Behavioral: Positive for dysphoric mood. Negative for decreased concentration. The patient is not nervous/anxious.        Grief Lost her son this year        Objective:   Physical Exam Constitutional:      General: She is not in acute distress.    Appearance: Normal appearance. She is well-developed. She is obese. She is not ill-appearing or diaphoretic.  HENT:     Head: Normocephalic and atraumatic.     Right Ear: Tympanic membrane, ear canal and external ear normal.     Left Ear: Tympanic membrane, ear canal and external ear normal.     Nose: Nose normal. No congestion.     Mouth/Throat:     Mouth: Mucous membranes are moist.     Pharynx: Oropharynx is clear. No posterior oropharyngeal erythema.  Eyes:     General: No scleral icterus.    Extraocular Movements: Extraocular movements intact.     Conjunctiva/sclera: Conjunctivae normal.  Pupils: Pupils are equal, round, and reactive to light.  Neck:     Thyroid: No thyromegaly.     Vascular: No carotid bruit or JVD.  Cardiovascular:     Rate and Rhythm: Normal rate and regular rhythm.     Pulses: Normal pulses.     Heart sounds: Normal heart sounds. No gallop.   Pulmonary:     Effort: Pulmonary effort is normal. No respiratory distress.     Breath sounds: Normal breath sounds. No wheezing.     Comments: Good air exch Chest:     Chest wall: No tenderness.  Abdominal:     General: Bowel sounds are normal. There is no distension or abdominal bruit.     Palpations: Abdomen is soft. There is no mass.     Tenderness: There is no abdominal tenderness.     Hernia: No hernia is present.  Genitourinary:    Comments: Breast and pelvic exam done by gyn  Musculoskeletal:        General: No tenderness. Normal range of motion.     Cervical back: Normal range of motion and neck supple. No rigidity. No muscular tenderness.     Right lower leg: No edema.     Left lower leg: No edema.  Lymphadenopathy:      Cervical: No cervical adenopathy.  Skin:    General: Skin is warm and dry.     Coloration: Skin is not pale.     Findings: No erythema or rash.     Comments: Solar lentigines diffusely   Tanned Olive complexion   Neurological:     Mental Status: She is alert. Mental status is at baseline.     Cranial Nerves: No cranial nerve deficit.     Motor: No abnormal muscle tone.     Coordination: Coordination normal.     Gait: Gait normal.     Deep Tendon Reflexes: Reflexes are normal and symmetric. Reflexes normal.  Psychiatric:        Mood and Affect: Mood normal.        Cognition and Memory: Cognition and memory normal.           Assessment & Plan:   Problem List Items Addressed This Visit      Cardiovascular and Mediastinum   Hypertension    bp has been rising  Disc lifestyle change and wt loss as a goal  Px lisinopril 10 mg daily - inst to call if side effects  F/u planned      Relevant Medications   lisinopril (ZESTRIL) 10 MG tablet   Other Relevant Orders   Comprehensive metabolic panel (Completed)   CBC with Differential/Platelet (Completed)   Lipid panel (Completed)   TSH (Completed)     Other   ANXIETY DEPRESSION    Pt lost her son this year- states she is adjusting  Lack of mtivation and fatigued noted Continues fluoxetine  Reviewed stressors/ coping techniques/symptoms/ support sources/ tx options and side effects in detail today       Relevant Medications   FLUoxetine (PROZAC) 20 MG capsule   Morbid obesity ([place])    Discussed how this problem influences overall health and the risks it imposes  Reviewed plan for weight loss with lower calorie diet (via better food choices and also portion control or program like weight watchers) and exercise building up to or more than 30 minutes 5 days per week including some aerobic activity         Routine general medical  examination at a health care facility - Primary    Reviewed health habits including diet and  exercise and skin cancer prevention Reviewed appropriate screening tests for age  Also reviewed health mt list, fam hx and immunization status , as well as social and family history   See HPI  Labs reviewed  Sent for last pap report  Mammogram reviewed report on care everywhere 01/08/19 Pt had covid disease in feb- enc her to consider vaccine  Will start treating HTN       Prediabetes    A1C drawn today  Wt gain noted disc imp of low glycemic diet and wt loss to prevent DM2       Relevant Orders   Hemoglobin A1c (Completed)   Iron deficiency anemia    CBC improved in January Sees hematology for IV iron  Cbc and ferritin drawn today      Relevant Medications   iron polysaccharides (NIFEREX) 150 MG capsule   Other Relevant Orders   CBC with Differential/Platelet (Completed)   Ferritin (Completed)

## 2019-08-18 LAB — CBC WITH DIFFERENTIAL/PLATELET
Absolute Monocytes: 446 cells/uL (ref 200–950)
Basophils Absolute: 58 cells/uL (ref 0–200)
Basophils Relative: 0.6 %
Eosinophils Absolute: 165 cells/uL (ref 15–500)
Eosinophils Relative: 1.7 %
HCT: 40.9 % (ref 35.0–45.0)
Hemoglobin: 13.6 g/dL (ref 11.7–15.5)
Lymphs Abs: 2270 cells/uL (ref 850–3900)
MCH: 30.6 pg (ref 27.0–33.0)
MCHC: 33.3 g/dL (ref 32.0–36.0)
MCV: 92.1 fL (ref 80.0–100.0)
MPV: 10.9 fL (ref 7.5–12.5)
Monocytes Relative: 4.6 %
Neutro Abs: 6761 cells/uL (ref 1500–7800)
Neutrophils Relative %: 69.7 %
Platelets: 298 10*3/uL (ref 140–400)
RBC: 4.44 10*6/uL (ref 3.80–5.10)
RDW: 12.9 % (ref 11.0–15.0)
Total Lymphocyte: 23.4 %
WBC: 9.7 10*3/uL (ref 3.8–10.8)

## 2019-08-18 LAB — HEMOGLOBIN A1C
Hgb A1c MFr Bld: 5.4 % of total Hgb (ref <5.7)
Mean Plasma Glucose: 108 (calc)
eAG (mmol/L): 6 (calc)

## 2019-08-18 LAB — COMPREHENSIVE METABOLIC PANEL
AG Ratio: 2 (calc) (ref 1.0–2.5)
ALT: 22 U/L (ref 6–29)
AST: 19 U/L (ref 10–35)
Albumin: 4.4 g/dL (ref 3.6–5.1)
Alkaline phosphatase (APISO): 72 U/L (ref 37–153)
BUN: 22 mg/dL (ref 7–25)
CO2: 27 mmol/L (ref 20–32)
Calcium: 9.7 mg/dL (ref 8.6–10.4)
Chloride: 102 mmol/L (ref 98–110)
Creat: 0.96 mg/dL (ref 0.50–1.05)
Globulin: 2.2 g/dL (calc) (ref 1.9–3.7)
Glucose, Bld: 104 mg/dL — ABNORMAL HIGH (ref 65–99)
Potassium: 4.5 mmol/L (ref 3.5–5.3)
Sodium: 139 mmol/L (ref 135–146)
Total Bilirubin: 0.4 mg/dL (ref 0.2–1.2)
Total Protein: 6.6 g/dL (ref 6.1–8.1)

## 2019-08-18 LAB — LIPID PANEL
Cholesterol: 214 mg/dL — ABNORMAL HIGH (ref <200)
HDL: 83 mg/dL (ref >=50)
LDL Cholesterol (Calc): 111 mg/dL (calc) — ABNORMAL HIGH
Non-HDL Cholesterol (Calc): 131 mg/dL (calc) — ABNORMAL HIGH (ref <130)
Total CHOL/HDL Ratio: 2.6 (calc) (ref <5.0)
Triglycerides: 94 mg/dL (ref <150)

## 2019-08-18 LAB — FERRITIN: Ferritin: 19 ng/mL (ref 16–232)

## 2019-08-18 LAB — TSH: TSH: 2.18 mIU/L (ref 0.40–4.50)

## 2019-08-18 NOTE — Assessment & Plan Note (Signed)
Discussed how this problem influences overall health and the risks it imposes  Reviewed plan for weight loss with lower calorie diet (via better food choices and also portion control or program like weight watchers) and exercise building up to or more than 30 minutes 5 days per week including some aerobic activity    

## 2019-08-18 NOTE — Assessment & Plan Note (Signed)
Reviewed health habits including diet and exercise and skin cancer prevention Reviewed appropriate screening tests for age  Also reviewed health mt list, fam hx and immunization status , as well as social and family history   See HPI  Labs reviewed  Sent for last pap report  Mammogram reviewed report on care everywhere 01/08/19 Pt had covid disease in feb- enc her to consider vaccine  Will start treating HTN

## 2019-08-18 NOTE — Assessment & Plan Note (Signed)
CBC improved in January Sees hematology for IV iron  Cbc and ferritin drawn today

## 2019-08-18 NOTE — Assessment & Plan Note (Signed)
A1C drawn today  Wt gain noted disc imp of low glycemic diet and wt loss to prevent DM2

## 2019-08-18 NOTE — Assessment & Plan Note (Addendum)
Pt lost her son this year- states she is adjusting  Lack of mtivation and fatigued noted Continues fluoxetine  Reviewed stressors/ coping techniques/symptoms/ support sources/ tx options and side effects in detail today

## 2019-08-18 NOTE — Assessment & Plan Note (Signed)
bp has been rising  Disc lifestyle change and wt loss as a goal  Px lisinopril 10 mg daily - inst to call if side effects  F/u planned

## 2019-08-23 ENCOUNTER — Telehealth: Payer: Self-pay | Admitting: Family Medicine

## 2019-08-23 NOTE — Telephone Encounter (Signed)
Merrill at [facility] called today. They received a records release request for record of patient last pap smear. They wanted to reach out because since we are in the same system they stated we should be able to see it.   They wanted to check with [place]. If needed they said they would fax it but thought it was already in her chart    Phone [phone]

## 2019-08-23 NOTE — Telephone Encounter (Signed)
I found that-thanks  That is all I need

## 2019-08-23 NOTE — Telephone Encounter (Signed)
See prev note, I can see pap smear in Epic on 01/08/19, ?? If pt needed records from somewhere else?, please advise

## 2019-10-05 ENCOUNTER — Encounter: Payer: Self-pay | Admitting: Family Medicine

## 2019-10-05 ENCOUNTER — Ambulatory Visit (INDEPENDENT_AMBULATORY_CARE_PROVIDER_SITE_OTHER): Payer: BC Managed Care – PPO | Admitting: Family Medicine

## 2019-10-05 ENCOUNTER — Ambulatory Visit: Payer: BC Managed Care – PPO | Admitting: Family Medicine

## 2019-10-05 ENCOUNTER — Other Ambulatory Visit: Payer: Self-pay

## 2019-10-05 VITALS — BP 128/84 | HR 63 | Temp 97.0°F | Ht 64.25 in | Wt 248.5 lb

## 2019-10-05 DIAGNOSIS — I10 Essential (primary) hypertension: Secondary | ICD-10-CM

## 2019-10-05 NOTE — Progress Notes (Signed)
Subjective:    Patient ID: Meagan Conway, female    DOB: July 11, 1960, 59 y.o.   MRN: [id number]  This visit occurred during the SARS-CoV-2 public health emergency.  Safety protocols were in place, including screening questions prior to the visit, additional usage of staff PPE, and extensive cleaning of exam room while observing appropriate contact time as indicated for disinfecting solutions.     HPI Here for f/u of HTN   Wt Readings from Last 3 Encounters:  10/05/19 (!) 248 lb 8 oz (112.7 kg)  08/17/19 247 lb 2 oz (112.1 kg)  03/22/19 244 lb 3.2 oz (110.8 kg)   42.32 kg/m  Last visit started lisinopril 10 mg daily for HTN  HTN bp is stable today  No cp or palpitations or headaches or edema  No side effects to medicines  BP Readings from Last 3 Encounters:  10/05/19 128/84  08/17/19 (!) 148/90  03/22/19 (!) 149/84     No side effects or issues  bp better at home also   No change in self care of habits  Patient Active Problem List   Diagnosis Date Noted  . IDA (iron deficiency anemia) 10/03/2018  . Special screening for malignant neoplasms, colon   . Ulceration of intestine   . Iron deficiency anemia 12/29/2014  . Colon cancer screening 07/08/2014  . Prediabetes 07/04/2013  . Routine general medical examination at a health care facility 06/04/2011  . GERD (gastroesophageal reflux disease) 03/10/2011  . Morbid obesity ([place]) 03/10/2011  . COLONIC POLYPS 04/22/2010  . ANXIETY DEPRESSION 02/28/2008  . Hypertension 01/15/2008   Past Medical History:  Diagnosis Date  . Anemia   . Depression    with anxious features  . GERD (gastroesophageal reflux disease)   . Hypertension 05/2005   H/O. no meds currently  . IDA (iron deficiency anemia) 10/03/2018  . Obesity   . Vertigo    last episode 03/18   Past Surgical History:  Procedure Laterality Date  . COLONOSCOPY WITH PROPOFOL N/A 08/27/2016   Procedure: COLONOSCOPY WITH PROPOFOL;  Surgeon: Lucilla Lame, MD;   Location: [place];  Service: Endoscopy;  Laterality: N/A;  . ENDOMETRIAL ABLATION W/ NOVASURE    . ESOPHAGOGASTRODUODENOSCOPY (EGD) WITH PROPOFOL N/A 08/29/2017   Procedure: ESOPHAGOGASTRODUODENOSCOPY (EGD) WITH PROPOFOL;  Surgeon: Lucilla Lame, MD;  Location: [place];  Service: Endoscopy;  Laterality: N/A;  pt request early arrival time  . GIVENS CAPSULE STUDY N/A 09/28/2017   Procedure: GIVENS CAPSULE STUDY;  Surgeon: Lucilla Lame, MD;  Location: [facility] ENDOSCOPY;  Service: Endoscopy;  Laterality: N/A;  . OVARIAN CYST SURGERY  08/2002  . VESICO-VAGINAL FISTULA REPAIR  1994   Social History   Tobacco Use  . Smoking status: Former Smoker    Quit date: 2014    Years since quitting: 7.5  . Smokeless tobacco: Never Used  . Tobacco comment: quit many years ago.  Substance Use Topics  . Alcohol use: Yes    Alcohol/week: 5.0 standard drinks    Types: 2 Glasses of wine, 3 Cans of beer per week    Comment: occ  . Drug use: No   Family History  Problem Relation Age of Onset  . Hypertension Mother        died of GI bleed-unexpected   . Hypertension Father   . Heart disease Father 9       MI  . Alcohol abuse Father   . Heart disease Maternal Grandmother   . Heart disease Paternal  Grandmother    No Known Allergies Current Outpatient Medications on File Prior to Visit  Medication Sig Dispense Refill  . FLUoxetine (PROZAC) 20 MG capsule Take 1 capsule (20 mg total) by mouth daily. 90 capsule 3  . iron polysaccharides (NIFEREX) 150 MG capsule Take 1 capsule (150 mg total) by mouth daily. 90 capsule 3  . lisinopril (ZESTRIL) 10 MG tablet Take 1 tablet (10 mg total) by mouth daily. 90 tablet 3  . methylcellulose (CITRUCEL) oral powder Take by mouth daily.    . pantoprazole (PROTONIX) 40 MG tablet Take 1 tablet (40 mg total) by mouth daily. 90 tablet 3   No current facility-administered medications on file prior to visit.    Review of Systems  Constitutional:  Negative for activity change, appetite change, fatigue, fever and unexpected weight change.  HENT: Negative for congestion, ear pain, rhinorrhea, sinus pressure and sore throat.   Eyes: Negative for pain, redness and visual disturbance.  Respiratory: Negative for cough, shortness of breath and wheezing.   Cardiovascular: Negative for chest pain and palpitations.  Gastrointestinal: Negative for abdominal pain, blood in stool, constipation and diarrhea.  Endocrine: Negative for polydipsia and polyuria.  Genitourinary: Negative for dysuria, frequency and urgency.  Musculoskeletal: Negative for arthralgias, back pain and myalgias.  Skin: Negative for pallor and rash.  Allergic/Immunologic: Negative for environmental allergies.  Neurological: Negative for dizziness, syncope and headaches.  Hematological: Negative for adenopathy. Does not bruise/bleed easily.  Psychiatric/Behavioral: Negative for decreased concentration and dysphoric mood. The patient is not nervous/anxious.        Objective:   Physical Exam Constitutional:      General: She is not in acute distress.    Appearance: Normal appearance. She is well-developed. She is obese.  HENT:     Head: Normocephalic and atraumatic.  Eyes:     Conjunctiva/sclera: Conjunctivae normal.     Pupils: Pupils are equal, round, and reactive to light.  Neck:     Thyroid: No thyromegaly.     Vascular: No carotid bruit or JVD.  Cardiovascular:     Rate and Rhythm: Normal rate and regular rhythm.     Heart sounds: Normal heart sounds. No gallop.   Pulmonary:     Effort: Pulmonary effort is normal. No respiratory distress.     Breath sounds: Normal breath sounds. No wheezing or rales.  Abdominal:     General: Bowel sounds are normal. There is no distension or abdominal bruit.     Palpations: Abdomen is soft. There is no mass.     Tenderness: There is no abdominal tenderness.  Musculoskeletal:     Cervical back: Normal range of motion and neck  supple.  Lymphadenopathy:     Cervical: No cervical adenopathy.  Skin:    General: Skin is warm and dry.     Findings: No rash.  Neurological:     Mental Status: She is alert.     Coordination: Coordination normal.     Deep Tendon Reflexes: Reflexes are normal and symmetric. Reflexes normal.  Psychiatric:        Mood and Affect: Mood normal.           Assessment & Plan:   Problem List Items Addressed This Visit      Cardiovascular and Mediastinum   Hypertension - Primary    bp in fair control at this time  BP Readings from Last 1 Encounters:  10/05/19 128/84   No changes needed Most recent labs reviewed  Disc lifstyle  change with low sodium diet and exercise  Well controlled with low dose lisinopril Will continue this         Other   Morbid obesity ([place])    Discussed how this problem influences overall health and the risks it imposes  Reviewed plan for weight loss with lower calorie diet (via better food choices and also portion control or program like weight watchers) and exercise building up to or more than 30 minutes 5 days per week including some aerobic activity   Pt is not very motivated for change right now

## 2019-10-05 NOTE — Patient Instructions (Signed)
Continue the lisinopril   Watch your sodium /processed food intake Stay active  Blood pressure looks better

## 2019-10-07 NOTE — Assessment & Plan Note (Signed)
Discussed how this problem influences overall health and the risks it imposes  Reviewed plan for weight loss with lower calorie diet (via better food choices and also portion control or program like weight watchers) and exercise building up to or more than 30 minutes 5 days per week including some aerobic activity   Pt is not very motivated for change right now

## 2019-10-07 NOTE — Assessment & Plan Note (Signed)
bp in fair control at this time  BP Readings from Last 1 Encounters:  10/05/19 128/84   No changes needed Most recent labs reviewed  Disc lifstyle change with low sodium diet and exercise  Well controlled with low dose lisinopril Will continue this

## 2019-11-23 ENCOUNTER — Encounter: Payer: Self-pay | Admitting: Radiology

## 2020-01-14 ENCOUNTER — Ambulatory Visit: Payer: BC Managed Care – PPO | Admitting: Obstetrics & Gynecology

## 2020-02-12 ENCOUNTER — Other Ambulatory Visit: Payer: Self-pay

## 2020-02-12 ENCOUNTER — Encounter: Payer: Self-pay | Admitting: Obstetrics & Gynecology

## 2020-02-12 ENCOUNTER — Ambulatory Visit (INDEPENDENT_AMBULATORY_CARE_PROVIDER_SITE_OTHER): Payer: BC Managed Care – PPO | Admitting: Obstetrics & Gynecology

## 2020-02-12 VITALS — BP 142/83 | HR 64 | Ht 65.0 in | Wt 250.4 lb

## 2020-02-12 DIAGNOSIS — Z01419 Encounter for gynecological examination (general) (routine) without abnormal findings: Secondary | ICD-10-CM | POA: Diagnosis not present

## 2020-02-12 DIAGNOSIS — Z1231 Encounter for screening mammogram for malignant neoplasm of breast: Secondary | ICD-10-CM

## 2020-02-12 NOTE — Patient Instructions (Signed)

## 2020-02-12 NOTE — Progress Notes (Signed)
GYNECOLOGY ANNUAL PREVENTATIVE CARE ENCOUNTER NOTE  History:     Meagan Conway is a 59 y.o. G27P2002 female here for a routine annual annual gynecologic exam.  Current complaints: none.   Denies abnormal vaginal bleeding, discharge, pelvic pain, problems with intercourse or other gynecologic concerns.    Gynecologic History No LMP recorded. Patient has had an ablation. Contraception: post menopausal status Last Pap: 01/08/2019. Results were: normal with negative HPV Last mammogram: 09/26/2017. Results were: normal  Obstetric History OB History  Gravida Para Term Preterm AB Living  2 2 2     2   SAB TAB Ectopic Multiple Live Births          2    # Outcome Date GA Lbr Len/2nd Weight Sex Delivery Anes PTL Lv  2 Term 1998    M Vag-Spont   LIV  1 Term 1988    M Vag-Spont   LIV    Past Medical History:  Diagnosis Date  . Anemia   . Depression    with anxious features  . GERD (gastroesophageal reflux disease)   . Hypertension 05/2005   H/O. no meds currently  . IDA (iron deficiency anemia) 10/03/2018  . Obesity   . Vertigo    last episode 03/18    Past Surgical History:  Procedure Laterality Date  . COLONOSCOPY WITH PROPOFOL N/A 08/27/2016   Procedure: COLONOSCOPY WITH PROPOFOL;  Surgeon: Lucilla Lame, MD;  Location: [place];  Service: Endoscopy;  Laterality: N/A;  . ENDOMETRIAL ABLATION W/ NOVASURE    . ESOPHAGOGASTRODUODENOSCOPY (EGD) WITH PROPOFOL N/A 08/29/2017   Procedure: ESOPHAGOGASTRODUODENOSCOPY (EGD) WITH PROPOFOL;  Surgeon: Lucilla Lame, MD;  Location: [place];  Service: Endoscopy;  Laterality: N/A;  pt request early arrival time  . GIVENS CAPSULE STUDY N/A 09/28/2017   Procedure: GIVENS CAPSULE STUDY;  Surgeon: Lucilla Lame, MD;  Location: [facility] ENDOSCOPY;  Service: Endoscopy;  Laterality: N/A;  . OVARIAN CYST SURGERY  08/2002  . VESICO-VAGINAL FISTULA REPAIR  1994    Current Outpatient Medications on File Prior to Visit  Medication Sig  Dispense Refill  . FLUoxetine (PROZAC) 20 MG capsule Take 1 capsule (20 mg total) by mouth daily. 90 capsule 3  . iron polysaccharides (NIFEREX) 150 MG capsule Take 1 capsule (150 mg total) by mouth daily. 90 capsule 3  . lisinopril (ZESTRIL) 10 MG tablet Take 1 tablet (10 mg total) by mouth daily. 90 tablet 3  . methylcellulose (CITRUCEL) oral powder Take by mouth daily.    . pantoprazole (PROTONIX) 40 MG tablet Take 1 tablet (40 mg total) by mouth daily. 90 tablet 3   No current facility-administered medications on file prior to visit.    No Known Allergies  Social History:  reports that she quit smoking about 7 years ago. She has never used smokeless tobacco. She reports current alcohol use of about 5.0 standard drinks of alcohol per week. She reports that she does not use drugs.  Family History  Problem Relation Age of Onset  . Hypertension Mother        died of GI bleed-unexpected   . Hypertension Father   . Heart disease Father 87       MI  . Alcohol abuse Father   . Heart disease Maternal Grandmother   . Heart disease Paternal Grandmother     The following portions of the patient's history were reviewed and updated as appropriate: allergies, current medications, past family history, past medical history, past social history,  past surgical history and problem list.  Review of Systems Pertinent items noted in HPI and remainder of comprehensive ROS otherwise negative.  Physical Exam:  BP (!) 142/83   Pulse 64   Ht 5\' 5"  (1.651 m)   Wt 250 lb 6.4 oz (113.6 kg)   BMI 41.67 kg/m  CONSTITUTIONAL: Well-developed, well-nourished female in no acute distress.  HENT:  Normocephalic, atraumatic, External right and left ear normal.  EYES: Conjunctivae and EOM are normal. Pupils are equal, round, and reactive to light. No scleral icterus.  NECK: Normal range of motion, supple, no masses.  Normal thyroid.  SKIN: Skin is warm and dry. No rash noted. Not diaphoretic. No erythema. No  pallor. MUSCULOSKELETAL: Normal range of motion. No tenderness.  No cyanosis, clubbing, or edema.   NEUROLOGIC: Alert and oriented to person, place, and time. Normal reflexes, muscle tone coordination.  PSYCHIATRIC: Normal mood and affect. Normal behavior. Normal judgment and thought content. CARDIOVASCULAR: Normal heart rate noted, regular rhythm RESPIRATORY: Clear to auscultation bilaterally. Effort and breath sounds normal, no problems with respiration noted. BREASTS: Symmetric in size. No masses, tenderness, skin changes, nipple drainage, or lymphadenopathy bilaterally. Performed in the presence of a chaperone. ABDOMEN: Soft, no distention noted.  No tenderness, rebound or guarding.  PELVIC: [place]   Assessment and Plan:      1. Breast cancer screening by mammogram Mammogram ordered - MM 3D SCREEN BREAST BILATERAL; Future  2. Well woman exam Up to date on pap smear, no GYN concerns. Routine preventative health maintenance measures emphasized. Please refer to After Visit Summary for other counseling recommendations.      Verita Schneiders, MD, [place] for [employer], [place]

## 2020-02-28 DIAGNOSIS — Z1231 Encounter for screening mammogram for malignant neoplasm of breast: Secondary | ICD-10-CM | POA: Diagnosis not present

## 2020-02-29 ENCOUNTER — Encounter: Payer: Self-pay | Admitting: Radiology

## 2020-03-26 DIAGNOSIS — Z03818 Encounter for observation for suspected exposure to other biological agents ruled out: Secondary | ICD-10-CM | POA: Diagnosis not present

## 2020-03-26 DIAGNOSIS — Z20822 Contact with and (suspected) exposure to covid-19: Secondary | ICD-10-CM | POA: Diagnosis not present

## 2020-06-23 ENCOUNTER — Ambulatory Visit
Admission: EM | Admit: 2020-06-23 | Discharge: 2020-06-23 | Disposition: A | Discharge status: Home / Self Care | Payer: BC Managed Care – PPO | Attending: Emergency Medicine | Admitting: Emergency Medicine

## 2020-06-23 DIAGNOSIS — R059 Cough, unspecified: Secondary | ICD-10-CM

## 2020-06-23 DIAGNOSIS — M545 Low back pain, unspecified: Secondary | ICD-10-CM

## 2020-06-23 MED ORDER — METHOCARBAMOL 500 MG PO TABS
500.0000 mg | ORAL_TABLET | Freq: Two times a day (BID) | ORAL | 0 refills | Status: DC
Start: 2020-06-23 — End: 2020-08-25

## 2020-06-23 MED ORDER — IBUPROFEN 600 MG PO TABS
600.0000 mg | ORAL_TABLET | Freq: Four times a day (QID) | ORAL | 0 refills | Status: DC | PRN
Start: 2020-06-23 — End: 2020-08-25

## 2020-06-23 NOTE — ED Provider Notes (Signed)
Meagan Conway    CSN: [id number] Arrival date & time: 06/23/20  1037      History   Chief Complaint Chief Complaint  Patient presents with  . Cough    X month   . Back Pain    HPI Meagan Conway is a 60 y.o. female.   Patient presents with 1 month history of cough.  She denies fever, chills, chest pain, shortness of breath.  Treatment attempted at home with cough drops.  She also reports bilateral low back pain x2 days.  She states the back pain started when she twisted the wrong way.  She denies numbness, weakness, paresthesias, saddle anesthesia, loss of bowel/bladder control, abdominal pain, dysuria, or other symptoms.  Treatment attempted at home with ibuprofen and warm compresses.  Her medical history includes hypertension, obesity, vertigo, iron deficiency anemia, GERD, depression.  The history is provided by the patient and medical records.    Past Medical History:  Diagnosis Date  . Anemia   . Depression    with anxious features  . GERD (gastroesophageal reflux disease)   . Hypertension 05/2005   H/O. no meds currently  . IDA (iron deficiency anemia) 10/03/2018  . Obesity   . Vertigo    last episode 03/18    Patient Active Problem List   Diagnosis Date Noted  . IDA (iron deficiency anemia) 10/03/2018  . Special screening for malignant neoplasms, colon   . Ulceration of intestine   . Iron deficiency anemia 12/29/2014  . Colon cancer screening 07/08/2014  . Prediabetes 07/04/2013  . Routine general medical examination at a health care facility 06/04/2011  . GERD (gastroesophageal reflux disease) 03/10/2011  . Morbid obesity ([place]) 03/10/2011  . COLONIC POLYPS 04/22/2010  . ANXIETY DEPRESSION 02/28/2008  . Hypertension 01/15/2008    Past Surgical History:  Procedure Laterality Date  . COLONOSCOPY WITH PROPOFOL N/A 08/27/2016   Procedure: COLONOSCOPY WITH PROPOFOL;  Surgeon: Lucilla Lame, MD;  Location: [place];  Service: Endoscopy;   Laterality: N/A;  . ENDOMETRIAL ABLATION W/ NOVASURE    . ESOPHAGOGASTRODUODENOSCOPY (EGD) WITH PROPOFOL N/A 08/29/2017   Procedure: ESOPHAGOGASTRODUODENOSCOPY (EGD) WITH PROPOFOL;  Surgeon: Lucilla Lame, MD;  Location: [place];  Service: Endoscopy;  Laterality: N/A;  pt request early arrival time  . GIVENS CAPSULE STUDY N/A 09/28/2017   Procedure: GIVENS CAPSULE STUDY;  Surgeon: Lucilla Lame, MD;  Location: [facility] ENDOSCOPY;  Service: Endoscopy;  Laterality: N/A;  . OVARIAN CYST SURGERY  08/2002  . VESICO-VAGINAL FISTULA REPAIR  1994    OB History    Gravida  2   Para  2   Term  2   Preterm      AB      Living  2     SAB      IAB      Ectopic      Multiple      Live Births  2            Home Medications    Prior to Admission medications   Medication Sig Start Date End Date Taking? Authorizing Provider  ibuprofen (ADVIL) 600 MG tablet Take 1 tablet (600 mg total) by mouth every 6 (six) hours as needed. 06/23/20  Yes Sharion Balloon, NP  methocarbamol (ROBAXIN) 500 MG tablet Take 1 tablet (500 mg total) by mouth 2 (two) times daily. 06/23/20  Yes Sharion Balloon, NP  FLUoxetine (PROZAC) 20 MG capsule Take 1 capsule (20 mg total) by  mouth daily. 08/17/19   Tower, Wynelle Fanny, MD  iron polysaccharides (NIFEREX) 150 MG capsule Take 1 capsule (150 mg total) by mouth daily. 08/17/19   Tower, Wynelle Fanny, MD  lisinopril (ZESTRIL) 10 MG tablet Take 1 tablet (10 mg total) by mouth daily. 08/17/19   Tower, Wynelle Fanny, MD  methylcellulose (CITRUCEL) oral powder Take by mouth daily.    [provider]  pantoprazole (PROTONIX) 40 MG tablet Take 1 tablet (40 mg total) by mouth daily. 08/17/19   Tower, Wynelle Fanny, MD    Family History Family History  Problem Relation Age of Onset  . Hypertension Mother        died of GI bleed-unexpected   . Hypertension Father   . Heart disease Father 64       MI  . Alcohol abuse Father   . Heart disease Maternal Grandmother   . Heart disease  Paternal Grandmother     Social History Social History   Tobacco Use  . Smoking status: Former Smoker    Quit date: 2014    Years since quitting: 8.2  . Smokeless tobacco: Never Used  . Tobacco comment: quit many years ago.  Substance Use Topics  . Alcohol use: Yes    Alcohol/week: 5.0 standard drinks    Types: 2 Glasses of wine, 3 Cans of beer per week    Comment: occ  . Drug use: No     Allergies   Patient has no known allergies.   Review of Systems Review of Systems  Constitutional: Negative for chills and fever.  HENT: Negative for ear pain and sore throat.   Eyes: Negative for pain and visual disturbance.  Respiratory: Positive for cough. Negative for shortness of breath.   Cardiovascular: Negative for chest pain and palpitations.  Gastrointestinal: Negative for abdominal pain and vomiting.  Genitourinary: Negative for dysuria and hematuria.  Musculoskeletal: Positive for back pain. Negative for arthralgias and gait problem.  Skin: Negative for color change and rash.  Neurological: Negative for syncope, weakness and numbness.  All other systems reviewed and are negative.    Physical Exam Triage Vital Signs ED Triage Vitals  Enc Vitals Group     BP      Pulse      Resp      Temp      Temp src      SpO2      Weight      Height      Head Circumference      Peak Flow      Pain Score      Pain Loc      Pain Edu?      Excl. in [place]?    No data found.  Updated Vital Signs BP 128/88 (BP Location: Left Arm)   Pulse 69   Temp 99 F (37.2 C) (Oral)   Resp 18   Wt 250 lb (113.4 kg)   SpO2 95%   BMI 41.60 kg/m   Visual Acuity Right Eye Distance:   Left Eye Distance:   Bilateral Distance:    Right Eye Near:   Left Eye Near:    Bilateral Near:     Physical Exam Vitals and nursing note reviewed.  Constitutional:      General: She is not in acute distress.    Appearance: She is well-developed. She is obese. She is not ill-appearing.  HENT:      Head: Normocephalic and atraumatic.     Right Ear:  Tympanic membrane normal.     Left Ear: Tympanic membrane normal.     Nose: Nose normal.     Mouth/Throat:     Mouth: Mucous membranes are moist.     Comments: Clear postnasal drip. Eyes:     Conjunctiva/sclera: Conjunctivae normal.  Cardiovascular:     Rate and Rhythm: Normal rate and regular rhythm.     Heart sounds: Normal heart sounds.  Pulmonary:     Effort: Pulmonary effort is normal. No respiratory distress.     Breath sounds: Normal breath sounds.  Abdominal:     Palpations: Abdomen is soft.     Tenderness: There is no abdominal tenderness.  Musculoskeletal:        General: No swelling, tenderness or deformity. Normal range of motion.     Cervical back: Neck supple.       Back:  Skin:    General: Skin is warm and dry.     Findings: No bruising, erythema, lesion or rash.  Neurological:     General: No focal deficit present.     Mental Status: She is alert and oriented to person, place, and time.     Sensory: No sensory deficit.     Motor: No weakness.     Gait: Gait normal.  Psychiatric:        Mood and Affect: Mood normal.        Behavior: Behavior normal.      UC Treatments / Results  Labs (all labs ordered are listed, but only abnormal results are displayed) Labs Reviewed - No data to display  EKG   Radiology No results found.  Procedures Procedures (including critical care time)  Medications Ordered in UC Medications - No data to display  Initial Impression / Assessment and Plan / UC Course  I have reviewed the triage vital signs and the nursing notes.  Pertinent labs & imaging results that were available during my care of the patient were reviewed by me and considered in my medical decision making (see chart for details).   Acute bilateral low back pain without sciatica.  Cough.  Patient is well-appearing and her exam is reassuring.  Treating low back pain with ibuprofen and Robaxin.   Precautions for drowsiness with muscle relaxer discussed.  Treating cough with over-the-counter antihistamine such as Claritin, Allegra, or Zyrtec.  Discussed with patient that her cough may be due to seasonal allergies or possibly due to the lisinopril she takes for her hypertension.  Discussed that she should follow-up with her PCP if the OTC antihistamine does not resolve her cough.  She agrees to plan of care.   Final Clinical Impressions(s) / UC Diagnoses   Final diagnoses:  Acute bilateral low back pain without sciatica  Cough     Discharge Instructions     Take ibuprofen as needed for discomfort.  Take the muscle relaxer as needed for muscle spasm; Do not drive, operate machinery, or drink alcohol with this medication as it can cause drowsiness.   Take an over-the-counter antihistamine such as Claritin, Allegra, or Zyrtec.  If your cough is not improving with this medication, follow-up with your primary care provider to discuss other potential causes of the cough such as the lisinopril.          ED Prescriptions    Medication Sig Dispense Auth. Provider   ibuprofen (ADVIL) 600 MG tablet Take 1 tablet (600 mg total) by mouth every 6 (six) hours as needed. 30 tablet Sharion Balloon, NP  methocarbamol (ROBAXIN) 500 MG tablet Take 1 tablet (500 mg total) by mouth 2 (two) times daily. 20 tablet Sharion Balloon, NP     PDMP not reviewed this encounter.   Sharion Balloon, NP 06/23/20 1123

## 2020-06-23 NOTE — Discharge Instructions (Signed)
Take ibuprofen as needed for discomfort.  Take the muscle relaxer as needed for muscle spasm; Do not drive, operate machinery, or drink alcohol with this medication as it can cause drowsiness.   Take an over-the-counter antihistamine such as Claritin, Allegra, or Zyrtec.  If your cough is not improving with this medication, follow-up with your primary care provider to discuss other potential causes of the cough such as the lisinopril.

## 2020-06-23 NOTE — ED Triage Notes (Signed)
Patient presents to Urgent Care with complaints of productive cough x 1 Month and lower back pain x 2 days. She is unsure if the lower back pain was caused by twisting the wrong way. Treating cough with cough drops. Treating back pain with ibuprofen and heat compress.   Denies any urinary symptoms, fever.

## 2020-07-24 ENCOUNTER — Other Ambulatory Visit: Payer: Self-pay | Admitting: Family Medicine

## 2020-08-25 ENCOUNTER — Encounter: Payer: Self-pay | Admitting: Family Medicine

## 2020-08-25 ENCOUNTER — Ambulatory Visit (INDEPENDENT_AMBULATORY_CARE_PROVIDER_SITE_OTHER): Payer: BC Managed Care – PPO | Admitting: Family Medicine

## 2020-08-25 ENCOUNTER — Ambulatory Visit: Payer: BC Managed Care – PPO | Admitting: Family Medicine

## 2020-08-25 ENCOUNTER — Other Ambulatory Visit: Payer: Self-pay

## 2020-08-25 VITALS — BP 122/76 | HR 65 | Temp 96.9°F | Ht 64.25 in | Wt 253.4 lb

## 2020-08-25 DIAGNOSIS — D509 Iron deficiency anemia, unspecified: Secondary | ICD-10-CM | POA: Diagnosis not present

## 2020-08-25 DIAGNOSIS — R5382 Chronic fatigue, unspecified: Secondary | ICD-10-CM

## 2020-08-25 DIAGNOSIS — I10 Essential (primary) hypertension: Secondary | ICD-10-CM | POA: Diagnosis not present

## 2020-08-25 DIAGNOSIS — Z79899 Other long term (current) drug therapy: Secondary | ICD-10-CM | POA: Diagnosis not present

## 2020-08-25 DIAGNOSIS — R5383 Other fatigue: Secondary | ICD-10-CM | POA: Insufficient documentation

## 2020-08-25 DIAGNOSIS — R7303 Prediabetes: Secondary | ICD-10-CM | POA: Diagnosis not present

## 2020-08-25 DIAGNOSIS — K219 Gastro-esophageal reflux disease without esophagitis: Secondary | ICD-10-CM

## 2020-08-25 DIAGNOSIS — F341 Dysthymic disorder: Secondary | ICD-10-CM

## 2020-08-25 MED ORDER — POLYSACCHARIDE IRON COMPLEX 150 MG PO CAPS: 150.0000 mg | ORAL_CAPSULE | Freq: Every day | ORAL | 3 refills | Status: DC

## 2020-08-25 MED ORDER — LISINOPRIL 10 MG PO TABS: 1.0000 | ORAL_TABLET | Freq: Every day | ORAL | 3 refills | Status: DC

## 2020-08-25 MED ORDER — FLUOXETINE HCL 20 MG PO CAPS: 20.0000 mg | ORAL_CAPSULE | Freq: Every day | ORAL | 3 refills | Status: DC

## 2020-08-25 NOTE — Patient Instructions (Addendum)
Try taking protonix every other day  Let us know if that does not work out If doing we after a month stop it   Lab today  Take care of yourself  Be more active  Drink lots of water  Use sunscreen

## 2020-08-25 NOTE — Progress Notes (Signed)
Subjective:    Patient ID: Meagan Conway, female    DOB: [birth year], 60 y.o.   MRN: [id number]  This visit occurred during the SARS-CoV-2 public health emergency.  Safety protocols were in place, including screening questions prior to the visit, additional usage of staff PPE, and extensive cleaning of exam room while observing appropriate contact time as indicated for disinfecting solutions.   HPI Pt presents for f/u of HTN and interested in checking iron   Wt Readings from Last 3 Encounters:  08/25/20 253 lb 6 oz (114.9 kg)  06/23/20 250 lb (113.4 kg)  02/12/20 250 lb 6.4 oz (113.6 kg)   43.15 kg/m More tired Not exercising (too tired)  Not a fan of exercise   Diet has not changed  Stress level is moderate and not changed (job is easy and she gets bored)   [place] the [place]  Is not motivated to do anything after work  At times Conservator, museum/gallery with grief     Still takes protonix  Ok as long as she takes it     HTN bp is stable today  No cp or palpitations or headaches or edema  No side effects to medicines  BP Readings from Last 3 Encounters:  08/25/20 122/76  06/23/20 128/88  02/12/20 (!) 142/83      Takes lisinopril 10 mg daily   Pulse Readings from Last 3 Encounters:  08/25/20 65  06/23/20 69  02/12/20 64   Iron def anemia  Much more tired lately for the past few month  A little more restless legs daily   Not craving ice   No vaginal bleeding  Has dark stool due to iron  Lab Results  Component Value Date   WBC 9.7 08/17/2019   HGB 13.6 08/17/2019   HCT 40.9 08/17/2019   MCV 92.1 08/17/2019   PLT 298 08/17/2019   Lab Results  Component Value Date   IRON 50 03/23/2019   TIBC 371 03/23/2019   FERRITIN 19 08/17/2019   Has had IV iron in the past  Takes oral iron 150 mg daily   Colonoscopy 6/18   Prediabetes Lab Results  Component Value Date   HGBA1C 5.4 08/17/2019    Patient Active Problem List   Diagnosis  Date Noted   Current use of proton pump inhibitor 08/25/2020   Fatigue 08/25/2020   IDA (iron deficiency anemia) 10/03/2018   Special screening for malignant neoplasms, colon    Ulceration of intestine    Iron deficiency anemia 12/29/2014   Colon cancer screening 07/08/2014   Prediabetes 07/04/2013   Routine general medical examination at a health care facility 06/04/2011   GERD (gastroesophageal reflux disease) 03/10/2011   Morbid obesity ([place]) 03/10/2011   COLONIC POLYPS 04/22/2010   ANXIETY DEPRESSION 02/28/2008   Hypertension 01/15/2008   Past Medical History:  Diagnosis Date   Anemia    Depression    with anxious features   GERD (gastroesophageal reflux disease)    Hypertension 05/2005   H/O. no meds currently   IDA (iron deficiency anemia) 10/03/2018   Obesity    Vertigo    last episode 03/18   Past Surgical History:  Procedure Laterality Date   COLONOSCOPY WITH PROPOFOL N/A 08/27/2016   Procedure: COLONOSCOPY WITH PROPOFOL;  Surgeon: Lucilla Lame, MD;  Location: [place];  Service: Endoscopy;  Laterality: N/A;   ENDOMETRIAL ABLATION W/ NOVASURE     ESOPHAGOGASTRODUODENOSCOPY (EGD) WITH PROPOFOL N/A 08/29/2017  Procedure: ESOPHAGOGASTRODUODENOSCOPY (EGD) WITH PROPOFOL;  Surgeon: Lucilla Lame, MD;  Location: [place];  Service: Endoscopy;  Laterality: N/A;  pt request early arrival time   GIVENS CAPSULE STUDY N/A 09/28/2017   Procedure: GIVENS CAPSULE STUDY;  Surgeon: Lucilla Lame, MD;  Location: [facility] ENDOSCOPY;  Service: Endoscopy;  Laterality: N/A;   OVARIAN CYST SURGERY  08/2002   VESICO-VAGINAL FISTULA REPAIR  1994   Social History   Tobacco Use   Smoking status: Former    Pack years: 0.00    Types: Cigarettes    Quit date: 2014    Years since quitting: 8.4   Smokeless tobacco: Never   Tobacco comments:    quit many years ago.  Substance Use Topics   Alcohol use: Yes    Alcohol/week: 5.0 standard drinks    Types: 2 Glasses of wine, 3  Cans of beer per week    Comment: occ   Drug use: No   Family History  Problem Relation Age of Onset   Hypertension Mother        died of GI bleed-unexpected    Hypertension Father    Heart disease Father 52       MI   Alcohol abuse Father    Heart disease Maternal Grandmother    Heart disease Paternal Grandmother    No Known Allergies Current Outpatient Medications on File Prior to Visit  Medication Sig Dispense Refill   methylcellulose (CITRUCEL) oral powder Take by mouth daily.     pantoprazole (PROTONIX) 40 MG tablet Take 1 tablet (40 mg total) by mouth daily. 90 tablet 3   No current facility-administered medications on file prior to visit.     Review of Systems  Constitutional:  Positive for fatigue. Negative for activity change, appetite change, fever and unexpected weight change.  HENT:  Negative for congestion, ear pain, rhinorrhea, sinus pressure and sore throat.   Eyes:  Negative for pain, redness and visual disturbance.  Respiratory:  Negative for cough, shortness of breath and wheezing.   Cardiovascular:  Negative for chest pain and palpitations.  Gastrointestinal:  Negative for abdominal pain, blood in stool, constipation and diarrhea.  Endocrine: Negative for polydipsia and polyuria.  Genitourinary:  Negative for dysuria, frequency and urgency.  Musculoskeletal:  Negative for arthralgias, back pain and myalgias.  Skin:  Negative for pallor and rash.  Allergic/Immunologic: Negative for environmental allergies.  Neurological:  Negative for dizziness, syncope and headaches.       Restless legs  Hematological:  Negative for adenopathy. Does not bruise/bleed easily.  Psychiatric/Behavioral:  Negative for decreased concentration and dysphoric mood. The patient is not nervous/anxious.       Objective:   Physical Exam Constitutional:      General: She is not in acute distress.    Appearance: Normal appearance. She is well-developed. She is obese. She is not  ill-appearing.  HENT:     Head: Normocephalic and atraumatic.  Eyes:     Conjunctiva/sclera: Conjunctivae normal.     Pupils: Pupils are equal, round, and reactive to light.  Neck:     Thyroid: No thyromegaly.     Vascular: No carotid bruit or JVD.  Cardiovascular:     Rate and Rhythm: Normal rate and regular rhythm.     Heart sounds: Normal heart sounds.    No gallop.  Pulmonary:     Effort: Pulmonary effort is normal. No respiratory distress.     Breath sounds: Normal breath sounds. No wheezing or rales.  Abdominal:     General: Bowel sounds are normal. There is no distension or abdominal bruit.     Palpations: Abdomen is soft. There is no mass.     Tenderness: There is no abdominal tenderness.  Musculoskeletal:     Cervical back: Normal range of motion and neck supple.     Right lower leg: No edema.     Left lower leg: No edema.  Lymphadenopathy:     Cervical: No cervical adenopathy.  Skin:    General: Skin is warm and dry.     Coloration: Skin is not jaundiced or pale.     Findings: No erythema or rash.  Neurological:     Mental Status: She is alert.     Cranial Nerves: No cranial nerve deficit.     Motor: No weakness.     Coordination: Coordination normal.     Deep Tendon Reflexes: Reflexes are normal and symmetric. Reflexes normal.  Psychiatric:        Mood and Affect: Mood normal.          Assessment & Plan:   Problem List Items Addressed This Visit       Cardiovascular and Mediastinum   Hypertension    bp in fair control at this time  BP Readings from Last 1 Encounters:  08/25/20 122/76  No changes needed Most recent labs reviewed  Disc lifstyle change with low sodium diet and exercise  Plan to continue lisinopril 10 mg daily       Relevant Medications   lisinopril (ZESTRIL) 10 MG tablet   Other Relevant Orders   CBC with Differential/Platelet   Basic metabolic panel     Digestive   GERD (gastroesophageal reflux disease)    Taking protonix  40 mg daily  Disc dec abs of vitamins that can occur with ppi as well as long term renal risks  Will plan to try and wean off of it  Starting with dosing every other day and then update /stop if able  Disc expectation of acid rebound for short periods but if this persists to return to it  Enc to watch diet for triggers         Other   ANXIETY DEPRESSION   Relevant Medications   FLUoxetine (PROZAC) 20 MG capsule   Morbid obesity ([place])    Discussed how this problem influences overall health and the risks it imposes  Reviewed plan for weight loss with lower calorie diet (via better food choices and also portion control or program like weight watchers) and exercise building up to or more than 30 minutes 5 days per week including some aerobic activity          Prediabetes    A1C today  Diet is fair       Relevant Orders   Hemoglobin A1c   Iron deficiency anemia - Primary    Has not had iron inf in over a year  Last hematology note reviewed Cbc today with iron studies Has fatigue and some RLS symptoms Takes niferex 150 mg daily  No new GI symptoms        Relevant Medications   iron polysaccharides (NIFEREX) 150 MG capsule   Other Relevant Orders   CBC with Differential/Platelet   Ferritin   Iron   Current use of proton pump inhibitor    Taking protonix 40 mg daily  Disc dec abs of vitamins that can occur with ppi as well as long term renal risks  Will plan  to try and wean off of it  Starting with dosing every other day and then update /stop if able  Disc expectation of acid rebound for short periods but if this persists to return to it  Enc to watch diet for triggers Labs today for B12, D, iron       Relevant Orders   VITAMIN D 25 Hydroxy (Vit-D Deficiency, Fractures)   Vitamin B12   T4, free   Fatigue    May be multifactorial but anemia is possible given hx  Lab today incl iron studies,cbc, b12 and tsh  Disc depression and grief/fairly stable at this  time       Relevant Orders   CBC with Differential/Platelet   TSH   VITAMIN D 25 Hydroxy (Vit-D Deficiency, Fractures)   Ferritin   Iron   Vitamin B12   T4, free

## 2020-08-25 NOTE — Assessment & Plan Note (Signed)
A1C today  Diet is fair

## 2020-08-25 NOTE — Assessment & Plan Note (Signed)
Taking protonix 40 mg daily  Disc dec abs of vitamins that can occur with ppi as well as long term renal risks  Will plan to try and wean off of it  Starting with dosing every other day and then update /stop if able  Disc expectation of acid rebound for short periods but if this persists to return to it  Enc to watch diet for triggers Labs today for B12, D, iron

## 2020-08-25 NOTE — Assessment & Plan Note (Signed)
May be multifactorial but anemia is possible given hx  Lab today incl iron studies,cbc, b12 and tsh  Disc depression and grief/fairly stable at this time

## 2020-08-25 NOTE — Assessment & Plan Note (Signed)
Taking protonix 40 mg daily  Disc dec abs of vitamins that can occur with ppi as well as long term renal risks  Will plan to try and wean off of it  Starting with dosing every other day and then update /stop if able  Disc expectation of acid rebound for short periods but if this persists to return to it  Enc to watch diet for triggers

## 2020-08-25 NOTE — Assessment & Plan Note (Signed)
Has not had iron inf in over a year  Last hematology note reviewed Cbc today with iron studies Has fatigue and some RLS symptoms Takes niferex 150 mg daily  No new GI symptoms

## 2020-08-25 NOTE — Assessment & Plan Note (Signed)
bp in fair control at this time  BP Readings from Last 1 Encounters:  08/25/20 122/76   No changes needed Most recent labs reviewed  Disc lifstyle change with low sodium diet and exercise  Plan to continue lisinopril 10 mg daily

## 2020-08-25 NOTE — Assessment & Plan Note (Signed)
Discussed how this problem influences overall health and the risks it imposes  Reviewed plan for weight loss with lower calorie diet (via better food choices and also portion control or program like weight watchers) and exercise building up to or more than 30 minutes 5 days per week including some aerobic activity    

## 2020-08-26 LAB — CBC WITH DIFFERENTIAL/PLATELET
Basophils Absolute: 0.1 10*3/uL (ref 0.0–0.1)
Basophils Relative: 1 % (ref 0.0–3.0)
Eosinophils Absolute: 0.1 10*3/uL (ref 0.0–0.7)
Eosinophils Relative: 1.6 % (ref 0.0–5.0)
HCT: 38.1 % (ref 36.0–46.0)
Hemoglobin: 12.6 g/dL (ref 12.0–15.0)
Lymphocytes Relative: 23.1 % (ref 12.0–46.0)
Lymphs Abs: 2 10*3/uL (ref 0.7–4.0)
MCHC: 33.2 g/dL (ref 30.0–36.0)
MCV: 89.6 fl (ref 78.0–100.0)
Monocytes Absolute: 0.5 10*3/uL (ref 0.1–1.0)
Monocytes Relative: 5.1 % (ref 3.0–12.0)
Neutro Abs: 6.1 10*3/uL (ref 1.4–7.7)
Neutrophils Relative %: 69.2 % (ref 43.0–77.0)
Platelets: 271 10*3/uL (ref 150.0–400.0)
RBC: 4.25 Mil/uL (ref 3.87–5.11)
RDW: 13.8 % (ref 11.5–15.5)
WBC: 8.9 10*3/uL (ref 4.0–10.5)

## 2020-08-26 LAB — VITAMIN B12: Vitamin B-12: 703 pg/mL (ref 211–911)

## 2020-08-26 LAB — BASIC METABOLIC PANEL
BUN: 16 mg/dL (ref 6–23)
CO2: 29 mEq/L (ref 19–32)
Calcium: 9 mg/dL (ref 8.4–10.5)
Chloride: 104 mEq/L (ref 96–112)
Creatinine, Ser: 0.84 mg/dL (ref 0.40–1.20)
GFR: 77.07 mL/min (ref >60.00)
Glucose, Bld: 100 mg/dL — ABNORMAL HIGH (ref 70–99)
Potassium: 4.2 mEq/L (ref 3.5–5.1)
Sodium: 141 mEq/L (ref 135–145)

## 2020-08-26 LAB — HEMOGLOBIN A1C: Hgb A1c MFr Bld: 6.1 % (ref 4.6–6.5)

## 2020-08-26 LAB — VITAMIN D 25 HYDROXY (VIT D DEFICIENCY, FRACTURES): VITD: 24.39 ng/mL — ABNORMAL LOW (ref 30.00–100.00)

## 2020-08-26 LAB — TSH: TSH: 1.94 u[IU]/mL (ref 0.35–4.50)

## 2020-08-26 LAB — FERRITIN: Ferritin: 10 ng/mL (ref 10.0–291.0)

## 2020-08-26 LAB — T4, FREE: Free T4: 0.63 ng/dL (ref 0.60–1.60)

## 2020-08-26 LAB — IRON: Iron: 35 ug/dL — ABNORMAL LOW (ref 42–145)

## 2020-08-27 ENCOUNTER — Telehealth: Payer: Self-pay | Admitting: *Deleted

## 2020-08-27 MED ORDER — POLYSACCHARIDE IRON COMPLEX 150 MG PO CAPS: 150.0000 mg | ORAL_CAPSULE | Freq: Two times a day (BID) | ORAL | 1 refills | Status: DC

## 2020-08-27 NOTE — Telephone Encounter (Signed)
Pt notified of Dr. Marliss Coots comments, also placed them on mychart. New Rx sent and lab appt scheduled

## 2020-08-27 NOTE — Telephone Encounter (Signed)
-----   Message from Abner Greenspan, MD sent at 08/26/2020  7:20 PM EDT ----- Cbc is still in the normal range but iron level is low and iron stores are low normal  Increase the niferex 150 mg to bid and re check cbc, iron and ferritin in a month  Vitamin D level is low , please get vit D3 otc and take 2000 iu daily A1C is up to 6.1- prediabetic level  Try to get back to a low glycemic diet  Try to get most of your carbohydrates from produce (with the exception of white potatoes)  Eat less bread/pasta/rice/snack foods/cereals/sweets and other items from the middle of the grocery store (processed carbs) Other labs nl  Hopefully doing these things will help fatigue

## 2020-09-17 ENCOUNTER — Encounter: Payer: BC Managed Care – PPO | Admitting: Family Medicine

## 2020-09-29 ENCOUNTER — Telehealth: Payer: Self-pay | Admitting: Family Medicine

## 2020-09-29 DIAGNOSIS — D509 Iron deficiency anemia, unspecified: Secondary | ICD-10-CM

## 2020-09-29 NOTE — Telephone Encounter (Signed)
-----   Message from Ellamae Sia sent at 09/18/2020  3:59 PM EDT ----- Regarding: Lab orders for Tuesday, 7.19.22 Lab order for 1 month f/u lab

## 2020-09-30 ENCOUNTER — Other Ambulatory Visit: Payer: BC Managed Care – PPO

## 2020-09-30 ENCOUNTER — Other Ambulatory Visit (INDEPENDENT_AMBULATORY_CARE_PROVIDER_SITE_OTHER): Payer: BC Managed Care – PPO

## 2020-09-30 ENCOUNTER — Other Ambulatory Visit: Payer: Self-pay

## 2020-09-30 DIAGNOSIS — D509 Iron deficiency anemia, unspecified: Secondary | ICD-10-CM | POA: Diagnosis not present

## 2020-09-30 LAB — CBC WITH DIFFERENTIAL/PLATELET
Basophils Absolute: 0.1 10*3/uL (ref 0.0–0.1)
Basophils Relative: 0.6 % (ref 0.0–3.0)
Eosinophils Absolute: 0.2 10*3/uL (ref 0.0–0.7)
Eosinophils Relative: 2.3 % (ref 0.0–5.0)
HCT: 38.2 % (ref 36.0–46.0)
Hemoglobin: 12.5 g/dL (ref 12.0–15.0)
Lymphocytes Relative: 26.2 % (ref 12.0–46.0)
Lymphs Abs: 2.3 10*3/uL (ref 0.7–4.0)
MCHC: 32.8 g/dL (ref 30.0–36.0)
MCV: 90 fl (ref 78.0–100.0)
Monocytes Absolute: 0.4 10*3/uL (ref 0.1–1.0)
Monocytes Relative: 4.7 % (ref 3.0–12.0)
Neutro Abs: 5.8 10*3/uL (ref 1.4–7.7)
Neutrophils Relative %: 66.2 % (ref 43.0–77.0)
Platelets: 272 10*3/uL (ref 150.0–400.0)
RBC: 4.25 Mil/uL (ref 3.87–5.11)
RDW: 14.4 % (ref 11.5–15.5)
WBC: 8.8 10*3/uL (ref 4.0–10.5)

## 2020-09-30 LAB — FERRITIN: Ferritin: 9.8 ng/mL — ABNORMAL LOW (ref 10.0–291.0)

## 2020-09-30 LAB — IRON: Iron: 30 ug/dL — ABNORMAL LOW (ref 42–145)

## 2020-10-02 ENCOUNTER — Telehealth: Payer: Self-pay

## 2020-10-02 NOTE — Progress Notes (Signed)
Message sent to scheduling 

## 2020-10-02 NOTE — Telephone Encounter (Signed)
-----   Message from Earlie Server, MD sent at 10/02/2020  8:29 AM EDT ----- Dr.Tower,  Thanks for the update. Since she was seen by me before,no need for another referral.   Benjamine Mola,  Please schedule her to see me- just MD visit. I have opening tmr, if she can come. Thanks.   Zhou ----- Message ----- From: Abner Greenspan, MD Sent: 10/01/2020   8:49 PM EDT To: Tammi Sou, CMA, Earlie Server, MD  Since she has some fatigue and restless leg symptoms I would like her to f/u with her hematologist (Dr Tasia Catchings signed off in 2021)  Continue the bid iron  Of she needs a referral, I can do that  Will cc to Dr Tasia Catchings

## 2020-10-02 NOTE — Telephone Encounter (Signed)
Please schedule patient for MD only tomorrow in [place]. Please notify pt of appt. Thanks

## 2020-10-06 ENCOUNTER — Encounter: Payer: Self-pay | Admitting: Oncology

## 2020-10-06 ENCOUNTER — Inpatient Hospital Stay: Payer: BC Managed Care – PPO | Attending: Oncology | Admitting: Oncology

## 2020-10-06 VITALS — BP 149/91 | HR 64 | Temp 97.7°F | Resp 18 | Wt 254.3 lb

## 2020-10-06 DIAGNOSIS — D500 Iron deficiency anemia secondary to blood loss (chronic): Secondary | ICD-10-CM | POA: Insufficient documentation

## 2020-10-06 DIAGNOSIS — Q2730 Arteriovenous malformation, site unspecified: Secondary | ICD-10-CM | POA: Insufficient documentation

## 2020-10-06 LAB — URINALYSIS, DIPSTICK ONLY
Bilirubin Urine: NEGATIVE
Glucose, UA: NEGATIVE mg/dL
Ketones, ur: NEGATIVE mg/dL
Nitrite: NEGATIVE
Protein, ur: NEGATIVE mg/dL
Specific Gravity, Urine: 1.017 (ref 1.005–1.030)
pH: 5 (ref 5.0–8.0)

## 2020-10-06 NOTE — Progress Notes (Signed)
Patient here for follow up. No new concerns voiced.  °

## 2020-10-06 NOTE — Progress Notes (Signed)
Hematology/Oncology follow up note [facility] Telephone:([phone] [phone]) [phone]   Patient Care Team: Tower, Wynelle Fanny, MD as PCP - General  REFERRING PROVIDER: Tower, Wynelle Fanny, MD CHIEF COMPLAINTS/REASON FOR VISIT:  Evaluation of iron deficiency anemia  HISTORY OF PRESENTING ILLNESS:  Meagan Conway is a  60 y.o.  female with PMH listed below was seen in consultation at the request of Tower, Wynelle Fanny, MD   for evaluation of iron deficiency anemia.   Reviewed patient's recent labs done on 09/26/2018. CBC showed hemoglobin of 10.1, MCV 71.3, platelet 353,000, WBC 9.8, normal differential Ferritin was checked and was decreased at 4.8.    Reviewed patient's previous labs ordered by other physicians, anemia is chronic onset , duration is since 2016. No aggravating or improving factors.  Associated signs and symptoms: Patient reports fatigue.  Reports SOB with exertion.  She also feels very stressed as her son passed away 3 weeks ago.  denies weight loss, easy bruising, hematochezia, hemoptysis, hematuria. Context:  History of iron deficiency: Reports chronic history of iron deficiency.  She has been taking oral iron supplementation for the past year.  Hemoglobin is not improved. Rectal bleeding: Denies Menstrual bleeding/ Vaginal bleeding : Denies Hematemesis or hemoptysis : denies Blood in urine : denies  Last endoscopy:08/29/2017 upper endoscopy showed large hiatal hernia.  Normal stomach. 08/27/2016 colonoscopy showed focal active colitis with ulceration.  Negative for dysplasia. 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  Denies hematochezia, hematuria, hematemesis, epistaxis, black tarry stool or easy bruising.   INTERVAL HISTORY Meagan Conway is a 60 y.o. female who has above history reviewed by me today presents for follow up visit for management of iron deficiency anemia Patient has had  IV venofer treatments in the past and tolerated well.  She was advise by PCP to re-establish care with hematology due to recurrent IDA.  Denies hematochezia, hematuria, hematemesis, epistaxis, black tarry stool or easy bruising.   She has been on oral iron supplementation   Review of Systems  Constitutional:  Negative for appetite change, chills, fatigue and fever.  HENT:   Negative for hearing loss and voice change.   Eyes:  Negative for eye problems.  Respiratory:  Negative for chest tightness and cough.   Cardiovascular:  Negative for chest pain.  Gastrointestinal:  Negative for abdominal distention, abdominal pain and blood in stool.  Endocrine: Negative for hot flashes.  Genitourinary:  Negative for difficulty urinating and frequency.   Musculoskeletal:  Negative for arthralgias.  Skin:  Negative for itching and rash.  Neurological:  Negative for extremity weakness.  Hematological:  Negative for adenopathy.  Psychiatric/Behavioral:  Negative for confusion and depression.    MEDICAL HISTORY:  Past Medical History:  Diagnosis Date   Anemia    Depression    with anxious features   GERD (gastroesophageal reflux disease)    Hypertension 05/2005   H/O. no meds currently   IDA (iron deficiency anemia) 10/03/2018   Obesity    Vertigo    last episode 03/18    SURGICAL HISTORY: Past Surgical History:  Procedure Laterality Date   COLONOSCOPY WITH PROPOFOL N/A 08/27/2016   Procedure: COLONOSCOPY WITH PROPOFOL;  Surgeon: Lucilla Lame, MD;  Location: [place];  Service: Endoscopy;  Laterality: N/A;   ENDOMETRIAL ABLATION W/ NOVASURE     ESOPHAGOGASTRODUODENOSCOPY (EGD) WITH PROPOFOL N/A 08/29/2017   Procedure: ESOPHAGOGASTRODUODENOSCOPY (EGD) WITH PROPOFOL;  Surgeon: Lucilla Lame, MD;  Location: [place];  Service: Endoscopy;  Laterality: N/A;  pt request early arrival time   GIVENS CAPSULE STUDY N/A 09/28/2017   Procedure: GIVENS CAPSULE STUDY;  Surgeon: Lucilla Lame, MD;  [facility]COPY;  Service: Endoscopy;  Laterality: N/A;   OVARIAN CYST SURGERY  08/2002   VESICO-VAGINAL FISTULA REPAIR  1994    SOCIAL HISTORY: Social History   Socioeconomic History   Marital status: Married    Spouse name: Not on file   Number of children: Not on file   Years of education: Not on file   Highest education level: Not on file  Occupational History   Not on file  Tobacco Use   Smoking status: Former    Types: Cigarettes    Quit date: 2014    Years since quitting: 8.5   Smokeless tobacco: Never   Tobacco comments:    quit many years ago.  Substance and Sexual Activity   Alcohol use: Yes    Alcohol/week: 5.0 standard drinks    Types: 2 Glasses of wine, 3 Cans of beer per week    Comment: occ   Drug use: No   Sexual activity: Yes    Partners: Male    Birth control/protection: None    Comment: husband has a vasectomy  Other Topics Concern   Not on file  Social History Narrative   Not on file   Social Determinants of Health   Financial Resource Strain: Not on file  Food Insecurity: Not on file  Transportation Needs: Not on file  Physical Activity: Not on file  Stress: Not on file  Social Connections: Not on file  Intimate Partner Violence: Not on file    FAMILY HISTORY: Family History  Problem Relation Age of Onset   Hypertension Mother        died of GI bleed-unexpected    Hypertension Father    Heart disease Father 37       MI   Alcohol abuse Father    Heart disease Maternal Grandmother    Heart disease Paternal Grandmother     ALLERGIES:  has No Known Allergies.  MEDICATIONS:  Current Outpatient Medications  Medication Sig Dispense Refill   FLUoxetine (PROZAC) 20 MG capsule Take 1 capsule (20 mg total) by mouth daily. 90 capsule 3   iron polysaccharides (NIFEREX) 150 MG capsule Take 1 capsule (150 mg total) by mouth 2 (two) times daily. 180 capsule 1   lisinopril (ZESTRIL) 10 MG tablet Take 1 tablet (10 mg  total) by mouth daily. 90 tablet 3   methylcellulose (CITRUCEL) oral powder Take by mouth daily.     pantoprazole (PROTONIX) 40 MG tablet Take 1 tablet (40 mg total) by mouth daily. (Patient taking differently: Take 40 mg by mouth every other day.) 90 tablet 3   No current facility-administered medications for this visit.     PHYSICAL EXAMINATION: ECOG PERFORMANCE STATUS: 1 - Symptomatic but completely ambulatory Vitals:   10/06/20 1049  BP: (!) 149/91  Pulse: 64  Resp: 18  Temp: 97.7 F (36.5 C)   Filed Weights   10/06/20 1049  Weight: 254 lb 4.8 oz (115.3 kg)    Physical Exam Constitutional:      General: She is not in acute distress. HENT:     Head: Normocephalic and atraumatic.  Eyes:     General: No scleral icterus.    Pupils: Pupils are equal, round, and reactive to light.  Cardiovascular:     Rate and Rhythm:  Normal rate and regular rhythm.     Heart sounds: Normal heart sounds.  Pulmonary:     Effort: Pulmonary effort is normal. No respiratory distress.     Breath sounds: No wheezing.  Abdominal:     General: Bowel sounds are normal. There is no distension.     Palpations: Abdomen is soft. There is no mass.     Tenderness: There is no abdominal tenderness.  Musculoskeletal:        General: No deformity. Normal range of motion.     Cervical back: Normal range of motion and neck supple.  Skin:    General: Skin is warm and dry.     Findings: No erythema or rash.  Neurological:     Mental Status: She is alert and oriented to person, place, and time.     Cranial Nerves: No cranial nerve deficit.     Coordination: Coordination normal.  Psychiatric:        Behavior: Behavior normal.        Thought Content: Thought content normal.      CMP Latest Ref Rng & Units 08/25/2020  Glucose 70 - 99 mg/dL 100(H)  BUN 6 - 23 mg/dL 16  Creatinine 0.40 - 1.20 mg/dL 0.84  Sodium 135 - 145 mEq/L 141  Potassium 3.5 - 5.1 mEq/L 4.2  Chloride 96 - 112 mEq/L 104  CO2 19 -  32 mEq/L 29  Calcium 8.4 - 10.5 mg/dL 9.0  Total Protein 6.1 - 8.1 g/dL -  Total Bilirubin 0.2 - 1.2 mg/dL -  Alkaline Phos 39 - 117 U/L -  AST 10 - 35 U/L -  ALT 6 - 29 U/L -   CBC Latest Ref Rng & Units 09/30/2020  WBC 4.0 - 10.5 K/uL 8.8  Hemoglobin 12.0 - 15.0 g/dL 12.5  Hematocrit 36.0 - 46.0 % 38.2  Platelets 150.0 - 400.0 K/uL 272.0     LABORATORY DATA:  I have reviewed the data as listed Lab Results  Component Value Date   WBC 8.8 09/30/2020   HGB 12.5 09/30/2020   HCT 38.2 09/30/2020   MCV 90.0 09/30/2020   PLT 272.0 09/30/2020   Recent Labs    08/25/20 1553  NA 141  K 4.2  CL 104  CO2 29  GLUCOSE 100*  BUN 16  CREATININE 0.84  CALCIUM 9.0    Iron/TIBC/Ferritin/ %Sat    Component Value Date/Time   IRON 30 (L) 09/30/2020 1313   TIBC 371 03/23/2019 1446   FERRITIN 9.8 (L) 09/30/2020 1313   IRONPCTSAT 14 03/23/2019 1446     No results found.    ASSESSMENT & PLAN:  1. Iron deficiency anemia due to chronic blood loss   2. AVM (arteriovenous malformation)    Labs are reviewed and discussed with patient. Ferritin 9.8, despite oral iron supplementation. Hb is normal.  Recommend IV venofer weekly x 3. Patient agrees with the plan. Check Urine dipstick.-  Urine showed moderate hemoglobin.  - will add microscopic analysis.   History of small bowel AVM. Recommend patient to contact G office for re-evaluation for need of enteroscopy.   Orders Placed This Encounter  Procedures   CBC with Differential/Platelet    Standing Status:   Future    Standing Expiration Date:   10/06/2021   Ferritin    Standing Status:   Future    Standing Expiration Date:   10/06/2021   Iron and TIBC    Standing Status:   Future    Standing  Expiration Date:   10/06/2021   Urinalysis, dipstick only    Standing Status:   Future    Number of Occurrences:   1    Standing Expiration Date:   10/06/2021    All questions were answered. The patient knows to call the clinic with any  problems questions or concerns.  Cc Tower, Wynelle Fanny, MD  Return of visit: 4 months.  We spent sufficient time to discuss many aspect of care, questions were answered to patient's satisfaction.   Earlie Server, MD, PhD Hematology Oncology [place] at [facility] 10/06/2020

## 2020-10-07 ENCOUNTER — Inpatient Hospital Stay: Payer: BC Managed Care – PPO

## 2020-10-07 ENCOUNTER — Other Ambulatory Visit: Payer: Self-pay

## 2020-10-07 VITALS — BP 115/78 | HR 66 | Temp 97.0°F | Resp 18

## 2020-10-07 DIAGNOSIS — D500 Iron deficiency anemia secondary to blood loss (chronic): Secondary | ICD-10-CM | POA: Diagnosis not present

## 2020-10-07 DIAGNOSIS — Q2730 Arteriovenous malformation, site unspecified: Secondary | ICD-10-CM | POA: Diagnosis not present

## 2020-10-07 LAB — URINALYSIS, DIPSTICK ONLY
Bilirubin Urine: NEGATIVE
Glucose, UA: NEGATIVE mg/dL
Hgb urine dipstick: NEGATIVE
Ketones, ur: NEGATIVE mg/dL
Leukocytes,Ua: NEGATIVE
Nitrite: NEGATIVE
Protein, ur: NEGATIVE mg/dL
Specific Gravity, Urine: 1.024 (ref 1.005–1.030)
pH: 6 (ref 5.0–8.0)

## 2020-10-07 MED ORDER — IRON SUCROSE 20 MG/ML IV SOLN
200.0000 mg | Freq: Once | INTRAVENOUS | Status: AC
  Administered 2020-10-07: 200 mg via INTRAVENOUS
  Filled 2020-10-07: qty 10

## 2020-10-07 MED ORDER — SODIUM CHLORIDE 0.9 % IV SOLN
INTRAVENOUS | Status: DC
  Filled 2020-10-07: qty 250

## 2020-10-10 ENCOUNTER — Inpatient Hospital Stay: Payer: BC Managed Care – PPO

## 2020-10-10 VITALS — BP 102/54 | HR 63 | Temp 97.4°F | Resp 16

## 2020-10-10 DIAGNOSIS — D500 Iron deficiency anemia secondary to blood loss (chronic): Secondary | ICD-10-CM | POA: Diagnosis not present

## 2020-10-10 DIAGNOSIS — Q2730 Arteriovenous malformation, site unspecified: Secondary | ICD-10-CM | POA: Diagnosis not present

## 2020-10-10 MED ORDER — IRON SUCROSE 20 MG/ML IV SOLN
200.0000 mg | Freq: Once | INTRAVENOUS | Status: AC
  Administered 2020-10-10: 200 mg via INTRAVENOUS
  Filled 2020-10-10: qty 10

## 2020-10-10 MED ORDER — SODIUM CHLORIDE 0.9 % IV SOLN
Freq: Once | INTRAVENOUS | Status: AC
  Filled 2020-10-10: qty 250

## 2020-10-10 NOTE — Patient Instructions (Signed)

## 2020-10-19 DIAGNOSIS — N390 Urinary tract infection, site not specified: Secondary | ICD-10-CM | POA: Diagnosis not present

## 2020-10-19 DIAGNOSIS — R300 Dysuria: Secondary | ICD-10-CM | POA: Diagnosis not present

## 2020-10-20 ENCOUNTER — Inpatient Hospital Stay: Payer: BC Managed Care – PPO | Attending: Oncology

## 2020-10-20 VITALS — BP 120/76 | HR 60 | Temp 98.0°F

## 2020-10-20 DIAGNOSIS — Q2730 Arteriovenous malformation, site unspecified: Secondary | ICD-10-CM | POA: Insufficient documentation

## 2020-10-20 DIAGNOSIS — D500 Iron deficiency anemia secondary to blood loss (chronic): Secondary | ICD-10-CM | POA: Diagnosis not present

## 2020-10-20 MED ORDER — IRON SUCROSE 20 MG/ML IV SOLN
200.0000 mg | Freq: Once | INTRAVENOUS | Status: AC
  Administered 2020-10-20: 200 mg via INTRAVENOUS
  Filled 2020-10-20: qty 10

## 2020-10-20 MED ORDER — SODIUM CHLORIDE 0.9 % IV SOLN
Freq: Once | INTRAVENOUS | Status: AC
  Filled 2020-10-20: qty 250

## 2020-10-20 NOTE — Patient Instructions (Signed)
[facility] [place] [facility]  Discharge Instructions: Thank you for choosing [facility] to provide your oncology and hematology care.  If you have a lab appointment with the Cancer Center, please go directly to the Cancer Center and check in at the registration area.  Wear comfortable clothing and clothing appropriate for easy access to any Portacath or PICC line.   We strive to give you quality time with your provider. You may need to reschedule your appointment if you arrive late (15 or more minutes).  Arriving late affects you and other patients whose appointments are after yours.  Also, if you miss three or more appointments without notifying the office, you may be dismissed from the clinic at the provider's discretion.      For prescription refill requests, have your pharmacy contact our office and allow 72 hours for refills to be completed.    Today you received the following chemotherapy and/or immunotherapy agents venofer   To help prevent nausea and vomiting after your treatment, we encourage you to take your nausea medication as directed.  BELOW ARE SYMPTOMS THAT SHOULD BE REPORTED IMMEDIATELY: *FEVER GREATER THAN 100.4 F (38 C) OR HIGHER *CHILLS OR SWEATING *NAUSEA AND VOMITING THAT IS NOT CONTROLLED WITH YOUR NAUSEA MEDICATION *UNUSUAL SHORTNESS OF BREATH *UNUSUAL BRUISING OR BLEEDING *URINARY PROBLEMS (pain or burning when urinating, or frequent urination) *BOWEL PROBLEMS (unusual diarrhea, constipation, pain near the anus) TENDERNESS IN MOUTH AND THROAT WITH OR WITHOUT PRESENCE OF ULCERS (sore throat, sores in mouth, or a toothache) UNUSUAL RASH, SWELLING OR PAIN  UNUSUAL VAGINAL DISCHARGE OR ITCHING   Items with * indicate a potential emergency and should be followed up as soon as possible or go to the Emergency Department if any problems should occur.  Please show the CHEMOTHERAPY ALERT CARD or IMMUNOTHERAPY ALERT CARD at check-in to the  Emergency Department and triage nurse.  Should you have questions after your visit or need to cancel or reschedule your appointment, please contact [facility]  [phone] and follow the prompts.  Office hours are 8:00 a.m. to 4:30 p.m. Monday - Friday. Please note that voicemails left after 4:00 p.m. may not be returned until the following business day.  We are closed weekends and major holidays. You have access to a nurse at all times for urgent questions. Please call the main number to the clinic [phone] and follow the prompts.  For any non-urgent questions, you may also contact your provider using MyChart. We now offer e-Visits for anyone 18 and older to request care online for non-urgent symptoms. For details visit [email or web address].   Also download the MyChart app! Go to the app store, search "MyChart", open the app, select [facility], and log in with your MyChart username and password.  Due to Covid, a mask is required upon entering the hospital/clinic. If you do not have a mask, one will be given to you upon arrival. For doctor visits, patients may have 1 support person aged 18 or older with them. For treatment visits, patients cannot have anyone with them due to current Covid guidelines and our immunocompromised population.  

## 2020-10-23 ENCOUNTER — Encounter: Payer: Self-pay | Admitting: Oncology

## 2020-10-23 NOTE — Telephone Encounter (Signed)
Pt completed 3 venofer infusions. Follow up is scheduled in NOV. Please advise.

## 2020-12-23 ENCOUNTER — Encounter: Payer: Self-pay | Admitting: Emergency Medicine

## 2020-12-23 ENCOUNTER — Ambulatory Visit
Admission: EM | Admit: 2020-12-23 | Discharge: 2020-12-23 | Disposition: A | Discharge status: Home / Self Care | Payer: BC Managed Care – PPO | Attending: Emergency Medicine | Admitting: Emergency Medicine

## 2020-12-23 ENCOUNTER — Other Ambulatory Visit: Payer: Self-pay

## 2020-12-23 ENCOUNTER — Ambulatory Visit: Payer: Self-pay

## 2020-12-23 DIAGNOSIS — R350 Frequency of micturition: Secondary | ICD-10-CM | POA: Diagnosis not present

## 2020-12-23 DIAGNOSIS — Z87440 Personal history of urinary (tract) infections: Secondary | ICD-10-CM | POA: Diagnosis not present

## 2020-12-23 DIAGNOSIS — R1030 Lower abdominal pain, unspecified: Secondary | ICD-10-CM | POA: Insufficient documentation

## 2020-12-23 DIAGNOSIS — R3915 Urgency of urination: Secondary | ICD-10-CM | POA: Insufficient documentation

## 2020-12-23 LAB — POCT URINALYSIS DIP (MANUAL ENTRY)
Bilirubin, UA: NEGATIVE
Blood, UA: NEGATIVE
Glucose, UA: NEGATIVE mg/dL
Ketones, POC UA: NEGATIVE mg/dL
Leukocytes, UA: NEGATIVE
Nitrite, UA: NEGATIVE
Protein Ur, POC: NEGATIVE mg/dL
Spec Grav, UA: 1.025 (ref 1.010–1.025)
Urobilinogen, UA: 0.2 E.U./dL
pH, UA: 6 (ref 5.0–8.0)

## 2020-12-23 MED ORDER — PHENAZOPYRIDINE HCL 200 MG PO TABS: 200.0000 mg | ORAL_TABLET | Freq: Three times a day (TID) | ORAL | 0 refills | Status: DC | PRN

## 2020-12-23 NOTE — ED Provider Notes (Signed)
HPI  SUBJECTIVE:  Meagan Conway is a 60 y.o. female who presents with 2 weeks of cloudy urine, urgency, frequency, low midline abdominal pressure after urinating only.  She does not have any other abdominal or pelvic pain at any other time.  Denies fevers, nausea, vomiting, dysuria, odorous urine, hematuria, back pain.  No vaginal odor, bleeding, discharge, rash.  She has not had intercourse since August.  No polyuria, polydipsia, unintentional weight loss.  No antibiotics in the past month.  No antipyretic in the past 6 hours.  No aggravating or alleviating factors.  She has not tried anything for her symptoms.  She has a past medical history of vaginal yeast infections, UTIs states this feels similar to previous UTIs.  No history of pyelonephritis.  No history of diabetes, STDs, BV, overactive bladder.  HQI:ONGEX, Wynelle Fanny, MD  Past Medical History:  Diagnosis Date   Anemia    Depression    with anxious features   GERD (gastroesophageal reflux disease)    Hypertension 05/2005   H/O. no meds currently   IDA (iron deficiency anemia) 10/03/2018   Obesity    Vertigo    last episode 03/18    Past Surgical History:  Procedure Laterality Date   COLONOSCOPY WITH PROPOFOL N/A 08/27/2016   Procedure: COLONOSCOPY WITH PROPOFOL;  Surgeon: Lucilla Lame, MD;  Location: [facility];  Service: Endoscopy;  Laterality: N/A;   ENDOMETRIAL ABLATION W/ NOVASURE     ESOPHAGOGASTRODUODENOSCOPY (EGD) WITH PROPOFOL N/A 08/29/2017   Procedure: ESOPHAGOGASTRODUODENOSCOPY (EGD) WITH PROPOFOL;  Surgeon: Lucilla Lame, MD;  Location: [facility];  Service: Endoscopy;  Laterality: N/A;  pt request early arrival time   GIVENS CAPSULE STUDY N/A 09/28/2017   Procedure: GIVENS CAPSULE STUDY;  Surgeon: Lucilla Lame, MD;  Location: [facility] ENDOSCOPY;  Service: Endoscopy;  Laterality: N/A;   OVARIAN CYST SURGERY  08/2002   VESICO-VAGINAL FISTULA REPAIR  1994    Family History  Problem Relation Age of Onset    Hypertension Mother        died of GI bleed-unexpected    Hypertension Father    Heart disease Father 70       MI   Alcohol abuse Father    Heart disease Maternal Grandmother    Heart disease Paternal Grandmother     Social History   Tobacco Use   Smoking status: Former    Types: Cigarettes    Quit date: 2014    Years since quitting: 8.7   Smokeless tobacco: Never   Tobacco comments:    quit many years ago.  Vaping Use   Vaping Use: Never used  Substance Use Topics   Alcohol use: Yes    Alcohol/week: 5.0 standard drinks    Types: 2 Glasses of wine, 3 Cans of beer per week    Comment: occ   Drug use: No    No current facility-administered medications for this encounter.  Current Outpatient Medications:    FLUoxetine (PROZAC) 20 MG capsule, Take 1 capsule (20 mg total) by mouth daily., Disp: 90 capsule, Rfl: 3   iron polysaccharides (NIFEREX) 150 MG capsule, Take 1 capsule (150 mg total) by mouth 2 (two) times daily., Disp: 180 capsule, Rfl: 1   lisinopril (ZESTRIL) 10 MG tablet, Take 1 tablet (10 mg total) by mouth daily., Disp: 90 tablet, Rfl: 3   methylcellulose (CITRUCEL) oral powder, Take by mouth daily., Disp: , Rfl:    pantoprazole (PROTONIX) 40 MG tablet, Take 1 tablet (40 mg total)  by mouth daily. (Patient taking differently: Take 40 mg by mouth every other day.), Disp: 90 tablet, Rfl: 3   phenazopyridine (PYRIDIUM) 200 MG tablet, Take 1 tablet (200 mg total) by mouth 3 (three) times daily as needed for pain., Disp: 6 tablet, Rfl: 0  No Known Allergies   ROS  As noted in HPI.   Physical Exam  BP (!) 146/89 (BP Location: Left Arm)   Pulse 60   Temp 97.7 F (36.5 C) (Oral)   Resp 16   SpO2 96%   Constitutional: Well developed, well nourished, no acute distress Eyes:  EOMI, conjunctiva normal bilaterally HENT: Normocephalic, atraumatic,mucus membranes moist Respiratory: Normal inspiratory effort Cardiovascular: Normal rate GI: nondistended tender.   No suprapubic, flank tenderness Back: No CVAT skin: No rash, skin intact Musculoskeletal: no deformities Neurologic: Alert & oriented x 3, no focal neuro deficits Psychiatric: Speech and behavior appropriate   ED Course   Medications - No data to display  Orders Placed This Encounter  Procedures   Urine Culture    Standing Status:   Standing    Number of Occurrences:   1    Order Specific Question:   Indication    Answer:   Urgency/frequency   POCT urinalysis dipstick    Standing Status:   Standing    Number of Occurrences:   1    Results for orders placed or performed during the hospital encounter of 12/23/20 (from the past 24 hour(s))  POCT urinalysis dipstick     Status: None   Collection Time: 12/23/20 12:48 PM  Result Value Ref Range   Color, UA yellow yellow   Clarity, UA clear clear   Glucose, UA negative negative mg/dL   Bilirubin, UA negative negative   Ketones, POC UA negative negative mg/dL   Spec Grav, UA 1.025 1.010 - 1.025   Blood, UA negative negative   pH, UA 6.0 5.0 - 8.0   Protein Ur, POC negative negative mg/dL   Urobilinogen, UA 0.2 0.2 or 1.0 E.U./dL   Nitrite, UA Negative Negative   Leukocytes, UA Negative Negative   No results found.  ED Clinical Impression  1. Urinary urgency      ED Assessment/Plan  Urine dip negative for UTI.  However, she is having  symptoms, so  will send urine off for culture to confirm absence of UTI.  In the meantime, Pyridium.  will also check vaginal swab for yeast and BV.   do not think that we need to check for STDs as she has not been sexually active in several months.  Discussed with her that vaginal infections can sometimes give people UTI-like symptoms.  Follow-up with PMD as needed.  No recent previous urine cultures available through [facility] or [facility] Everywhere.  Last urine culture 2015.  Discussed labs, MDM, treatment plan, and plan for follow-up with patient. . patient agrees with plan.   Meds ordered  this encounter  Medications   phenazopyridine (PYRIDIUM) 200 MG tablet    Sig: Take 1 tablet (200 mg total) by mouth 3 (three) times daily as needed for pain.    Dispense:  6 tablet    Refill:  0      *This clinic note was created using [employer]. Therefore, there may be occasional mistakes despite careful proofreading.  ?    Melynda Ripple, MD 12/24/20 [phone]

## 2020-12-23 NOTE — ED Triage Notes (Signed)
Pr presents for lower abdomen pressure adter urinating x 1 week

## 2020-12-23 NOTE — Discharge Instructions (Addendum)
Urine dipstick looks like it is negative for an infection, but I am sending it off for culture to make absolutely sure that you do not have a UTI.  Your swab for BV and yeast will be back in several days as well.  We will contact you and prescribe the appropriate medication based on what we find.  In the meantime, continue drinking plenty of extra fluids.  The Pyridium will turn your urine orange, but should help with your symptoms.

## 2020-12-24 ENCOUNTER — Telehealth (HOSPITAL_COMMUNITY): Payer: Self-pay | Admitting: Emergency Medicine

## 2020-12-24 LAB — URINE CULTURE: Culture: 60000 — AB

## 2020-12-24 LAB — CERVICOVAGINAL ANCILLARY ONLY
Bacterial Vaginitis (gardnerella): POSITIVE — AB
Candida Glabrata: NEGATIVE
Candida Vaginitis: NEGATIVE
Comment: NEGATIVE
Comment: NEGATIVE
Comment: NEGATIVE

## 2020-12-24 MED ORDER — METRONIDAZOLE 500 MG PO TABS: 500.0000 mg | ORAL_TABLET | Freq: Two times a day (BID) | ORAL | 0 refills | Status: DC

## 2021-01-13 ENCOUNTER — Ambulatory Visit
Admission: RE | Admit: 2021-01-13 | Discharge: 2021-01-13 | Disposition: A | Discharge status: Home / Self Care | Payer: BC Managed Care – PPO | Source: Ambulatory Visit | Attending: Emergency Medicine | Admitting: Emergency Medicine

## 2021-01-13 ENCOUNTER — Other Ambulatory Visit: Payer: Self-pay

## 2021-01-13 VITALS — BP 125/81 | HR 73 | Temp 97.9°F | Resp 18

## 2021-01-13 DIAGNOSIS — M545 Low back pain, unspecified: Secondary | ICD-10-CM | POA: Diagnosis not present

## 2021-01-13 LAB — POCT URINALYSIS DIP (MANUAL ENTRY)
Blood, UA: NEGATIVE
Glucose, UA: NEGATIVE mg/dL
Ketones, POC UA: NEGATIVE mg/dL
Leukocytes, UA: NEGATIVE
Nitrite, UA: NEGATIVE
Protein Ur, POC: NEGATIVE mg/dL
Spec Grav, UA: 1.025 (ref 1.010–1.025)
Urobilinogen, UA: 0.2 E.U./dL
pH, UA: 6 (ref 5.0–8.0)

## 2021-01-13 MED ORDER — CYCLOBENZAPRINE HCL 10 MG PO TABS: 10.0000 mg | ORAL_TABLET | Freq: Two times a day (BID) | ORAL | 0 refills | Status: DC | PRN

## 2021-01-13 NOTE — Discharge Instructions (Addendum)
Take ibuprofen as needed for discomfort.  Take the muscle relaxer as needed for muscle spasm; Do not drive, operate machinery, or drink alcohol with this medication as it can cause drowsiness.   Follow up with your primary care provider or an orthopedist if your symptoms are not improving.     

## 2021-01-13 NOTE — ED Triage Notes (Signed)
Pt presents with constant dull R flank pain x 2 weeks.  Has had two UTIs in recent months, just finishing abx approx 13 days ago.  No urinary symptoms or abdominal pain currently.

## 2021-01-13 NOTE — ED Provider Notes (Signed)
[name]    CSN: [id number] Arrival date & time: 01/13/21  1530      History   Chief Complaint Chief Complaint  Patient presents with   Flank Pain    HPI KELIA GIBBON is a 60 y.o. female.  Presents with right low back pain x 2 weeks.  The pain started when she twisted and it feels like a pulled muscle.  She denies numbness, weakness, saddle anesthesia, loss of bowel/bladder control, dysuria, hematuria, abdominal pain, vaginal discharge, pelvic pain, or other symptoms.  Patient was seen at this urgent care on 12/23/2020; diagnosed with urinary urgency; urine culture grew 60,000 colonies of group B strep; vaginal swab was positive for bacterial vaginitis and patient was treated with metronidazole on 12/24/2020.  Her medical history includes hypertension, obesity, GERD, anemia, vertigo, depression.  The history is provided by the patient and medical records.   Past Medical History:  Diagnosis Date   Anemia    Depression    with anxious features   GERD (gastroesophageal reflux disease)    Hypertension 05/2005   H/O. no meds currently   IDA (iron deficiency anemia) 10/03/2018   Obesity    Vertigo    last episode 03/18    Patient Active Problem List   Diagnosis Date Noted   Current use of proton pump inhibitor 08/25/2020   Fatigue 08/25/2020   IDA (iron deficiency anemia) 10/03/2018   Special screening for malignant neoplasms, colon    Ulceration of intestine    Iron deficiency anemia 12/29/2014   Colon cancer screening 07/08/2014   Prediabetes 07/04/2013   Routine general medical examination at a health care facility 06/04/2011   GERD (gastroesophageal reflux disease) 03/10/2011   Morbid obesity ([place]) 03/10/2011   COLONIC POLYPS 04/22/2010   ANXIETY DEPRESSION 02/28/2008   Hypertension 01/15/2008    Past Surgical History:  Procedure Laterality Date   COLONOSCOPY WITH PROPOFOL N/A 08/27/2016   Procedure: COLONOSCOPY WITH PROPOFOL;  Surgeon: Lucilla Lame, MD;  Location: [facility];  Service: Endoscopy;  Laterality: N/A;   ENDOMETRIAL ABLATION W/ NOVASURE     ESOPHAGOGASTRODUODENOSCOPY (EGD) WITH PROPOFOL N/A 08/29/2017   Procedure: ESOPHAGOGASTRODUODENOSCOPY (EGD) WITH PROPOFOL;  Surgeon: Lucilla Lame, MD;  Location: [facility];  Service: Endoscopy;  Laterality: N/A;  pt request early arrival time   GIVENS CAPSULE STUDY N/A 09/28/2017   Procedure: GIVENS CAPSULE STUDY;  Surgeon: Lucilla Lame, MD;  Location[facility] ENDOSCOPY;  Service: Endoscopy;  Laterality: N/A;   OVARIAN CYST SURGERY  08/2002   VESICO-VAGINAL FISTULA REPAIR  1994    OB History     Gravida  2   Para  2   Term  2   Preterm      AB      Living  2      SAB      IAB      Ectopic      Multiple      Live Births  2            Home Medications    Prior to Admission medications   Medication Sig Start Date End Date Taking? Authorizing Provider  cyclobenzaprine (FLEXERIL) 10 MG tablet Take 1 tablet (10 mg total) by mouth 2 (two) times daily as needed for muscle spasms. 01/13/21  Yes Sharion Balloon, NP  FLUoxetine (PROZAC) 20 MG capsule Take 1 capsule (20 mg total) by mouth daily. 08/25/20   Tower, Wynelle Fanny, MD  iron polysaccharides (NIFEREX) 150  MG capsule Take 1 capsule (150 mg total) by mouth 2 (two) times daily. 08/27/20   Tower, Wynelle Fanny, MD  lisinopril (ZESTRIL) 10 MG tablet Take 1 tablet (10 mg total) by mouth daily. 08/25/20   Tower, Wynelle Fanny, MD  methylcellulose (CITRUCEL) oral powder Take by mouth daily.    [provider]  metroNIDAZOLE (FLAGYL) 500 MG tablet Take 1 tablet (500 mg total) by mouth 2 (two) times daily. 12/24/20   Chase Picket, MD  pantoprazole (PROTONIX) 40 MG tablet Take 1 tablet (40 mg total) by mouth daily. Patient taking differently: Take 40 mg by mouth every other day. 08/17/19   Tower, Wynelle Fanny, MD  phenazopyridine (PYRIDIUM) 200 MG tablet Take 1 tablet (200 mg total) by mouth 3 (three) times daily as  needed for pain. 12/23/20   Melynda Ripple, MD    Family History Family History  Problem Relation Age of Onset   Hypertension Mother        died of GI bleed-unexpected    Hypertension Father    Heart disease Father 66       MI   Alcohol abuse Father    Heart disease Maternal Grandmother    Heart disease Paternal Grandmother     Social History Social History   Tobacco Use   Smoking status: Former    Types: Cigarettes    Quit date: 2014    Years since quitting: 8.8   Smokeless tobacco: Never   Tobacco comments:    quit many years ago.  Vaping Use   Vaping Use: Never used  Substance Use Topics   Alcohol use: Yes    Alcohol/week: 5.0 standard drinks    Types: 2 Glasses of wine, 3 Cans of beer per week    Comment: occ   Drug use: No     Allergies   Patient has no known allergies.   Review of Systems Review of Systems  Constitutional:  Negative for chills and fever.  Respiratory:  Negative for cough and shortness of breath.   Cardiovascular:  Negative for chest pain and palpitations.  Gastrointestinal:  Negative for abdominal pain, diarrhea and vomiting.  Genitourinary:  Negative for dysuria, hematuria, pelvic pain and vaginal discharge.  Musculoskeletal:  Positive for back pain. Negative for gait problem.  Skin:  Negative for color change and rash.  Neurological:  Negative for weakness.  All other systems reviewed and are negative.   Physical Exam Triage Vital Signs ED Triage Vitals  Enc Vitals Group     BP      Pulse      Resp      Temp      Temp src      SpO2      Weight      Height      Head Circumference      Peak Flow      Pain Score      Pain Loc      Pain Edu?      Excl. in [place]?    No data found.  Updated Vital Signs BP 125/81 (BP Location: Left Arm)   Pulse 73   Temp 97.9 F (36.6 C)   Resp 18   SpO2 96%   Visual Acuity Right Eye Distance:   Left Eye Distance:   Bilateral Distance:    Right Eye Near:   Left Eye Near:     Bilateral Near:     Physical Exam Vitals and nursing note reviewed.  Constitutional:      General: She is not in acute distress.    Appearance: She is well-developed. She is obese.  HENT:     Head: Normocephalic and atraumatic.     Mouth/Throat:     Mouth: Mucous membranes are moist.  Eyes:     Conjunctiva/sclera: Conjunctivae normal.  Cardiovascular:     Rate and Rhythm: Normal rate and regular rhythm.     Heart sounds: Normal heart sounds.  Pulmonary:     Effort: Pulmonary effort is normal. No respiratory distress.     Breath sounds: Normal breath sounds.  Abdominal:     Palpations: Abdomen is soft.     Tenderness: There is no abdominal tenderness. There is no right CVA tenderness, left CVA tenderness, guarding or rebound.  Musculoskeletal:        General: No swelling, tenderness, deformity or signs of injury. Normal range of motion.     Cervical back: Neck supple.  Skin:    General: Skin is warm and dry.     Capillary Refill: Capillary refill takes less than 2 seconds.     Findings: No bruising, erythema, lesion or rash.  Neurological:     General: No focal deficit present.     Mental Status: She is alert and oriented to person, place, and time.     Sensory: No sensory deficit.     Motor: No weakness.     Gait: Gait normal.  Psychiatric:        Mood and Affect: Mood normal.        Behavior: Behavior normal.     UC Treatments / Results  Labs (all labs ordered are listed, but only abnormal results are displayed) Labs Reviewed  POCT URINALYSIS DIP (MANUAL ENTRY) - Abnormal; Notable for the following components:      Result Value   Bilirubin, UA small (*)    All other components within normal limits    EKG   Radiology No results found.  Procedures Procedures (including critical care time)  Medications Ordered in UC Medications - No data to display  Initial Impression / Assessment and Plan / UC Course  I have reviewed the triage vital signs and the  nursing notes.  Pertinent labs & imaging results that were available during my care of the patient were reviewed by me and considered in my medical decision making (see chart for details).  Acute right lower back pain without sciatica.  Urine does not show signs of infection.  Treating with ibuprofen and Flexeril.  Precautions for drowsiness with Flexeril discussed.  Instructed patient to follow-up with her PCP or an orthopedist if her symptoms are not improving.  She agrees to plan of care.   Final Clinical Impressions(s) / UC Diagnoses   Final diagnoses:  Acute right-sided low back pain without sciatica     Discharge Instructions      Take ibuprofen as needed for discomfort.  Take the muscle relaxer as needed for muscle spasm; Do not drive, operate machinery, or drink alcohol with this medication as it can cause drowsiness.   Follow up with your primary care provider or an orthopedist if your symptoms are not improving.         ED Prescriptions     Medication Sig Dispense Auth. Provider   cyclobenzaprine (FLEXERIL) 10 MG tablet Take 1 tablet (10 mg total) by mouth 2 (two) times daily as needed for muscle spasms. 20 tablet Sharion Balloon, NP      PDMP  not reviewed this encounter.   Sharion Balloon, NP 01/13/21 [phone]

## 2021-01-22 ENCOUNTER — Encounter: Payer: Self-pay | Admitting: Radiology

## 2021-02-09 ENCOUNTER — Inpatient Hospital Stay: Payer: BC Managed Care – PPO | Attending: Oncology

## 2021-02-09 ENCOUNTER — Other Ambulatory Visit: Payer: Self-pay

## 2021-02-09 DIAGNOSIS — Q2730 Arteriovenous malformation, site unspecified: Secondary | ICD-10-CM | POA: Insufficient documentation

## 2021-02-09 DIAGNOSIS — D500 Iron deficiency anemia secondary to blood loss (chronic): Secondary | ICD-10-CM | POA: Insufficient documentation

## 2021-02-09 LAB — CBC WITH DIFFERENTIAL/PLATELET
Abs Immature Granulocytes: 0.05 10*3/uL (ref 0.00–0.07)
Basophils Absolute: 0.1 10*3/uL (ref 0.0–0.1)
Basophils Relative: 1 %
Eosinophils Absolute: 0.2 10*3/uL (ref 0.0–0.5)
Eosinophils Relative: 2 %
HCT: 41.4 % (ref 36.0–46.0)
Hemoglobin: 13.2 g/dL (ref 12.0–15.0)
Immature Granulocytes: 1 %
Lymphocytes Relative: 31 %
Lymphs Abs: 2.7 10*3/uL (ref 0.7–4.0)
MCH: 30.9 pg (ref 26.0–34.0)
MCHC: 31.9 g/dL (ref 30.0–36.0)
MCV: 97 fL (ref 80.0–100.0)
Monocytes Absolute: 0.4 10*3/uL (ref 0.1–1.0)
Monocytes Relative: 5 %
Neutro Abs: 5.3 10*3/uL (ref 1.7–7.7)
Neutrophils Relative %: 60 %
Platelets: 315 10*3/uL (ref 150–400)
RBC: 4.27 MIL/uL (ref 3.87–5.11)
RDW: 13.2 % (ref 11.5–15.5)
WBC: 8.7 10*3/uL (ref 4.0–10.5)
nRBC: 0 % (ref 0.0–0.2)

## 2021-02-09 LAB — IRON AND TIBC
Iron: 68 ug/dL (ref 28–170)
Saturation Ratios: 17 % (ref 10.4–31.8)
TIBC: 393 ug/dL (ref 250–450)
UIBC: 325 ug/dL

## 2021-02-09 LAB — FERRITIN: Ferritin: 16 ng/mL (ref 11–307)

## 2021-02-11 ENCOUNTER — Inpatient Hospital Stay: Payer: BC Managed Care – PPO

## 2021-02-11 ENCOUNTER — Inpatient Hospital Stay: Payer: BC Managed Care – PPO | Admitting: Oncology

## 2021-02-12 ENCOUNTER — Inpatient Hospital Stay: Payer: BC Managed Care – PPO

## 2021-02-12 ENCOUNTER — Inpatient Hospital Stay: Payer: BC Managed Care – PPO | Attending: Oncology | Admitting: Nurse Practitioner

## 2021-02-12 ENCOUNTER — Other Ambulatory Visit: Payer: Self-pay

## 2021-02-12 VITALS — BP 133/82 | HR 61 | Temp 97.1°F | Resp 18

## 2021-02-12 DIAGNOSIS — E611 Iron deficiency: Secondary | ICD-10-CM

## 2021-02-12 DIAGNOSIS — D508 Other iron deficiency anemias: Secondary | ICD-10-CM | POA: Diagnosis not present

## 2021-02-12 DIAGNOSIS — D500 Iron deficiency anemia secondary to blood loss (chronic): Secondary | ICD-10-CM

## 2021-02-12 MED ORDER — SODIUM CHLORIDE 0.9 % IV SOLN
INTRAVENOUS | Status: DC
  Filled 2021-02-12: qty 250

## 2021-02-12 MED ORDER — IRON SUCROSE 20 MG/ML IV SOLN
200.0000 mg | Freq: Once | INTRAVENOUS | Status: AC
  Administered 2021-02-12: 200 mg via INTRAVENOUS
  Filled 2021-02-12: qty 10

## 2021-02-12 NOTE — Progress Notes (Addendum)
Hematology/Oncology follow up note Meagan Conway, [place] [phone] [phone]) [phone]  Virtual Visit Progress Note  I connected with Meagan Conway on 02/20/21 at  2:30 PM EST by telephone visit and verified that I am speaking with the correct person using two identifiers.   I discussed the limitations, risks, security and privacy concerns of performing an evaluation and management service by telemedicine and the availability of in-person appointments. I also discussed with the patient that there may be a patient responsible charge related to this service. The patient expressed understanding and agreed to proceed.   Other persons participating in the visit and their role in the encounter: none   Patient's location: home  Provider's location: clinic   Patient Care Team: Tower, Wynelle Fanny, MD as PCP - General  REFERRING PROVIDER: Tower, Wynelle Fanny, MD  CHIEF COMPLAINTS/REASON FOR VISIT:  Evaluation of iron deficiency anemia  HISTORY OF PRESENTING ILLNESS:  Meagan Conway is a  60 y.o.  female with PMH listed below was seen in consultation at the request of Tower, Wynelle Fanny, MD for evaluation of iron deficiency anemia.   Reviewed patient's recent labs done on 09/26/2018. CBC showed hemoglobin of 10.1, MCV 71.3, platelet 353,000, WBC 9.8, normal differential Ferritin was checked and was decreased at 4.8.    Reviewed patient's previous labs ordered by other physicians, anemia is chronic onset , duration is since 2016. No aggravating or improving factors.  Associated signs and symptoms: Patient reports fatigue.  Reports SOB with exertion.  She also feels very stressed as her son passed away 3 weeks ago.  denies weight loss, easy bruising, hematochezia, hemoptysis, hematuria. Context:  History of iron deficiency: Reports chronic history of iron deficiency.  She has been taking oral iron supplementation for the past year.  Hemoglobin is not improved. Rectal  bleeding: Denies Menstrual bleeding/ Vaginal bleeding : Denies Hematemesis or hemoptysis : denies Blood in urine : denies  Last endoscopy:08/29/2017 upper endoscopy showed large hiatal hernia.  Normal stomach. 08/27/2016 colonoscopy showed focal active colitis with ulceration.  Negative for dysplasia. 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  Denies hematochezia, hematuria, hematemesis, epistaxis, black tarry stool or easy bruising.   INTERVAL HISTORY Meagan Conway is a 60 y.o. female with above history who agrees to visit via telemedicine for follow-up for management of iron deficiency anemia.  She last received IV iron in August 2022.  Has persistent fatigue but otherwise feels at baseline.  Denies any neurologic complaints. Denies recent fevers or illnesses. Denies any easy bleeding or bruising. No melena or hematochezia. No pica or restless leg. Reports good appetite and denies weight loss. Denies chest pain. Denies any nausea, vomiting, constipation, or diarrhea. Denies urinary complaints. Patient offers no further specific complaints today.  Review of Systems  Constitutional:  Positive for fatigue. Negative for appetite change, chills and fever.  HENT:   Negative for hearing loss and voice change.   Eyes:  Negative for eye problems.  Respiratory:  Negative for chest tightness and cough.   Cardiovascular:  Negative for chest pain.  Gastrointestinal:  Negative for abdominal distention, abdominal pain and blood in stool.  Endocrine: Negative for hot flashes.  Genitourinary:  Negative for difficulty urinating and frequency.   Musculoskeletal:  Negative for arthralgias.  Skin:  Negative for itching and rash.  Neurological:  Negative for extremity weakness.  Hematological:  Negative for adenopathy.  Psychiatric/Behavioral:  Negative for confusion and depression.  MEDICAL HISTORY:  Past Medical History:  Diagnosis Date   Anemia     Depression    with anxious features   GERD (gastroesophageal reflux disease)    Hypertension 05/2005   H/O. no meds currently   IDA (iron deficiency anemia) 10/03/2018   Obesity    Vertigo    last episode 03/18    SURGICAL HISTORY: Past Surgical History:  Procedure Laterality Date   COLONOSCOPY WITH PROPOFOL N/A 08/27/2016   Procedure: COLONOSCOPY WITH PROPOFOL;  Surgeon: Lucilla Lame, MD;  Location: [place];  Service: Endoscopy;  Laterality: N/A;   ENDOMETRIAL ABLATION W/ NOVASURE     ESOPHAGOGASTRODUODENOSCOPY (EGD) WITH PROPOFOL N/A 08/29/2017   Procedure: ESOPHAGOGASTRODUODENOSCOPY (EGD) WITH PROPOFOL;  Surgeon: Lucilla Lame, MD;  Location: [place];  Service: Endoscopy;  Laterality: N/A;  pt request early arrival time   GIVENS CAPSULE STUDY N/A 09/28/2017   Procedure: GIVENS CAPSULE STUDY;  Surgeon: Lucilla Lame, MD;  Location: [facility] ENDOSCOPY;  Service: Endoscopy;  Laterality: N/A;   OVARIAN CYST SURGERY  08/2002   VESICO-VAGINAL FISTULA REPAIR  1994    SOCIAL HISTORY: Social History   Socioeconomic History   Marital status: Married    Spouse name: Not on file   Number of children: Not on file   Years of education: Not on file   Highest education level: Not on file  Occupational History   Not on file  Tobacco Use   Smoking status: Former    Types: Cigarettes    Quit date: 2014    Years since quitting: 8.9   Smokeless tobacco: Never   Tobacco comments:    quit many years ago.  Vaping Use   Vaping Use: Never used  Substance and Sexual Activity   Alcohol use: Yes    Alcohol/week: 5.0 standard drinks    Types: 2 Glasses of wine, 3 Cans of beer per week    Comment: occ   Drug use: No   Sexual activity: Yes    Partners: Male    Birth control/protection: None    Comment: husband has a vasectomy  Other Topics Concern   Not on file  Social History Narrative   Not on file   Social Determinants of Health   Financial Resource Strain: Not  on file  Food Insecurity: Not on file  Transportation Needs: Not on file  Physical Activity: Not on file  Stress: Not on file  Social Connections: Not on file  Intimate Partner Violence: Not on file    FAMILY HISTORY: Family History  Problem Relation Age of Onset   Hypertension Mother        died of GI bleed-unexpected    Hypertension Father    Heart disease Father 23       MI   Alcohol abuse Father    Heart disease Maternal Grandmother    Heart disease Paternal Grandmother     ALLERGIES:  has No Known Allergies.  MEDICATIONS:  Current Outpatient Medications  Medication Sig Dispense Refill   cyclobenzaprine (FLEXERIL) 10 MG tablet Take 1 tablet (10 mg total) by mouth 2 (two) times daily as needed for muscle spasms. 20 tablet 0   FLUoxetine (PROZAC) 20 MG capsule Take 1 capsule (20 mg total) by mouth daily. 90 capsule 3   iron polysaccharides (NIFEREX) 150 MG capsule Take 1 capsule (150 mg total) by mouth 2 (two) times daily. 180 capsule 1   lisinopril (ZESTRIL) 10 MG tablet Take 1 tablet (10 mg total) by mouth  daily. 90 tablet 3   methylcellulose (CITRUCEL) oral powder Take by mouth daily.     metroNIDAZOLE (FLAGYL) 500 MG tablet Take 1 tablet (500 mg total) by mouth 2 (two) times daily. 14 tablet 0   pantoprazole (PROTONIX) 40 MG tablet Take 1 tablet (40 mg total) by mouth daily. (Patient taking differently: Take 40 mg by mouth every other day.) 90 tablet 3   phenazopyridine (PYRIDIUM) 200 MG tablet Take 1 tablet (200 mg total) by mouth 3 (three) times daily as needed for pain. 6 tablet 0   No current facility-administered medications for this visit.    PHYSICAL EXAMINATION: ECOG PERFORMANCE STATUS: 1 - Symptomatic but completely ambulatory There were no vitals filed for this visit.  There were no vitals filed for this visit.   Physical Exam Constitutional:      General: She is not in acute distress. HENT:     Head: Normocephalic.  Pulmonary:     Effort: No  respiratory distress.  Neurological:     Mental Status: She is alert and oriented to person, place, and time.  Psychiatric:        Mood and Affect: Mood normal.        Behavior: Behavior normal.    LABORATORY DATA:  I have reviewed the data as listed CBC Latest Ref Rng & Units 02/09/2021 09/30/2020 08/25/2020  WBC 4.0 - 10.5 K/uL 8.7 8.8 8.9  Hemoglobin 12.0 - 15.0 g/dL 13.2 12.5 12.6  Hematocrit 36.0 - 46.0 % 41.4 38.2 38.1  Platelets 150 - 400 K/uL 315 272.0 271.0   Iron/TIBC/Ferritin/ %Sat    Component Value Date/Time   IRON 68 02/09/2021 1348   TIBC 393 02/09/2021 1348   FERRITIN 16 02/09/2021 1348   IRONPCTSAT 17 02/09/2021 1348   ASSESSMENT & PLAN:  1. Other iron deficiency anemia    1.  Iron deficiency anemia- hx of small bowel avm (see below). Hemoglobin now normal at 13.2, no microcytosis.  Ferritin slightly low at 16.  Iron saturation 17%.  Clinically she is symptomatic.  Recommend Venofer x 2.   2.  Hematuria-resolved.   3.  History of small bowel AVM- Again recommended she contact GI for possible enteroscopy.   Disposition: 3 months- labs (cbc, ferritin, iron studies). Day to week later see Dr. Tasia Catchings for possible venofer. Visit can also be virtual if patient prefers.   No orders of the defined types were placed in this encounter.   I discussed the assessment and treatment plan with the patient. The patient was provided an opportunity to ask questions and all were answered. The patient agreed with the plan and demonstrated an understanding of the instructions.   The patient was advised to call back or seek an in-person evaluation if the symptoms worsen or if the condition fails to improve as anticipated.   Video failed and we converted to telephone visit. A total of 15 minutes was spent on this visit.   Beckey Rutter, DNP, AGNP-C [place] at [facility] [phone] (clinic) 02/12/2021  Cc Tower, Wynelle Fanny, MD

## 2021-02-12 NOTE — Patient Instructions (Signed)

## 2021-02-19 ENCOUNTER — Other Ambulatory Visit: Payer: Self-pay

## 2021-02-19 ENCOUNTER — Inpatient Hospital Stay: Payer: BC Managed Care – PPO

## 2021-02-19 ENCOUNTER — Encounter: Payer: Self-pay | Admitting: Oncology

## 2021-02-19 VITALS — BP 137/83 | HR 55 | Temp 97.0°F | Resp 18

## 2021-02-19 DIAGNOSIS — E611 Iron deficiency: Secondary | ICD-10-CM | POA: Insufficient documentation

## 2021-02-19 DIAGNOSIS — D500 Iron deficiency anemia secondary to blood loss (chronic): Secondary | ICD-10-CM

## 2021-02-19 DIAGNOSIS — D508 Other iron deficiency anemias: Secondary | ICD-10-CM | POA: Diagnosis not present

## 2021-02-19 MED ORDER — SODIUM CHLORIDE 0.9 % IV SOLN
INTRAVENOUS | Status: DC
  Filled 2021-02-19: qty 250

## 2021-02-19 MED ORDER — IRON SUCROSE 20 MG/ML IV SOLN
200.0000 mg | Freq: Once | INTRAVENOUS | Status: AC
  Administered 2021-02-19: 200 mg via INTRAVENOUS
  Filled 2021-02-19: qty 10

## 2021-02-19 NOTE — Patient Instructions (Signed)

## 2021-03-15 DIAGNOSIS — K449 Diaphragmatic hernia without obstruction or gangrene: Secondary | ICD-10-CM

## 2021-03-15 HISTORY — DX: Diaphragmatic hernia without obstruction or gangrene: K44.9

## 2021-03-24 ENCOUNTER — Other Ambulatory Visit: Payer: Self-pay

## 2021-03-24 ENCOUNTER — Ambulatory Visit (INDEPENDENT_AMBULATORY_CARE_PROVIDER_SITE_OTHER): Payer: BC Managed Care – PPO | Admitting: Obstetrics & Gynecology

## 2021-03-24 ENCOUNTER — Other Ambulatory Visit (HOSPITAL_COMMUNITY)
Admission: RE | Admit: 2021-03-24 | Discharge: 2021-03-24 | Disposition: A | Discharge status: Home / Self Care | Payer: BC Managed Care – PPO | Source: Ambulatory Visit | Attending: Obstetrics & Gynecology | Admitting: Obstetrics & Gynecology

## 2021-03-24 ENCOUNTER — Encounter: Payer: Self-pay | Admitting: Obstetrics & Gynecology

## 2021-03-24 ENCOUNTER — Encounter: Payer: BC Managed Care – PPO | Admitting: Obstetrics & Gynecology

## 2021-03-24 VITALS — BP 134/86 | HR 62 | Ht 65.0 in | Wt 252.0 lb

## 2021-03-24 DIAGNOSIS — Z01419 Encounter for gynecological examination (general) (routine) without abnormal findings: Secondary | ICD-10-CM | POA: Diagnosis not present

## 2021-03-24 DIAGNOSIS — N820 Vesicovaginal fistula: Secondary | ICD-10-CM

## 2021-03-24 DIAGNOSIS — Z1231 Encounter for screening mammogram for malignant neoplasm of breast: Secondary | ICD-10-CM | POA: Diagnosis not present

## 2021-03-24 DIAGNOSIS — N898 Other specified noninflammatory disorders of vagina: Secondary | ICD-10-CM

## 2021-03-24 NOTE — Progress Notes (Signed)
GYNECOLOGY ANNUAL PREVENTATIVE CARE ENCOUNTER NOTE  History:     Meagan Conway is a 61 y.o. G67P2002 female here for a routine annual gynecologic exam.  Current complaints: history of vesico-vaginal fistula s/p incomplete repair in 1994. Still has issues with it and desires repeat evaluation and management. Also reports having abnormal vaginal discharge, wants evaluation for this.   Denies abnormal vaginal bleeding, pelvic pain, problems with intercourse or other gynecologic concerns.    Gynecologic History No LMP recorded. Patient has had an ablation. Contraception: post menopausal status Last Pap: 03/10/2019. Result was normal with negative HPV Last Mammogram: 02/28/2020.  Result was normal Last Colonoscopy: 08/27/2016.  Result was normal  Obstetric History OB History  Gravida Para Term Preterm AB Living  2 2 2     2   SAB IAB Ectopic Multiple Live Births          2    # Outcome Date GA Lbr Len/2nd Weight Sex Delivery Anes PTL Lv  2 Term 1998    M Vag-Spont   LIV  1 Term 1988    M Vag-Spont   LIV    Past Medical History:  Diagnosis Date   Anemia    Depression    with anxious features   GERD (gastroesophageal reflux disease)    Hypertension 05/2005   H/O. no meds currently   IDA (iron deficiency anemia) 10/03/2018   Obesity    Vertigo    last episode 03/18    Past Surgical History:  Procedure Laterality Date   COLONOSCOPY WITH PROPOFOL N/A 08/27/2016   Procedure: COLONOSCOPY WITH PROPOFOL;  Surgeon: Lucilla Lame, MD;  Location: [place];  Service: Endoscopy;  Laterality: N/A;   ENDOMETRIAL ABLATION W/ NOVASURE     ESOPHAGOGASTRODUODENOSCOPY (EGD) WITH PROPOFOL N/A 08/29/2017   Procedure: ESOPHAGOGASTRODUODENOSCOPY (EGD) WITH PROPOFOL;  Surgeon: Lucilla Lame, MD;  Location: [place];  Service: Endoscopy;  Laterality: N/A;  pt request early arrival time   GIVENS CAPSULE STUDY N/A 09/28/2017   Procedure: GIVENS CAPSULE STUDY;  Surgeon: Lucilla Lame,  MD;  Location: [facility] ENDOSCOPY;  Service: Endoscopy;  Laterality: N/A;   OVARIAN CYST SURGERY  08/2002   VESICO-VAGINAL FISTULA REPAIR  1994    Current Outpatient Medications on File Prior to Visit  Medication Sig Dispense Refill   FLUoxetine (PROZAC) 20 MG capsule Take 1 capsule (20 mg total) by mouth daily. 90 capsule 3   iron polysaccharides (NIFEREX) 150 MG capsule Take 1 capsule (150 mg total) by mouth 2 (two) times daily. 180 capsule 1   lisinopril (ZESTRIL) 10 MG tablet Take 1 tablet (10 mg total) by mouth daily. 90 tablet 3   No current facility-administered medications on file prior to visit.    No Known Allergies  Social History:  reports that she quit smoking about 9 years ago. Her smoking use included cigarettes. She has never used smokeless tobacco. She reports current alcohol use of about 5.0 standard drinks per week. She reports that she does not use drugs.  Family History  Problem Relation Age of Onset   Hypertension Mother        died of GI bleed-unexpected    Hypertension Father    Heart disease Father 21       MI   Alcohol abuse Father    Heart disease Maternal Grandmother    Heart disease Paternal Grandmother     The following portions of the patient's history were reviewed and updated as appropriate: allergies, current  medications, past family history, past medical history, past social history, past surgical history and problem list.  Review of Systems Pertinent items noted in HPI and remainder of comprehensive ROS otherwise negative.  Physical Exam:  BP 134/86    Pulse 62    Ht 5\' 5"  (1.651 m)    Wt 252 lb (114.3 kg)    BMI 41.93 kg/m  CONSTITUTIONAL: Well-developed, well-nourished female in no acute distress.  HENT:  Normocephalic, atraumatic, External right and left ear normal.  EYES: Conjunctivae and EOM are normal. Pupils are equal, round, and reactive to light. No scleral icterus.  NECK: Normal range of motion, supple, no masses.  Normal thyroid.   SKIN: Skin is warm and dry. No rash noted. Not diaphoretic. No erythema. No pallor. MUSCULOSKELETAL: Normal range of motion. No tenderness.  No cyanosis, clubbing, or edema. NEUROLOGIC: Alert and oriented to person, place, and time. Normal reflexes, muscle tone coordination.  PSYCHIATRIC: Normal mood and affect. Normal behavior. Normal judgment and thought content. CARDIOVASCULAR: Normal heart rate noted, regular rhythm RESPIRATORY: Clear to auscultation bilaterally. Effort and breath sounds normal, no problems with respiration noted. BREASTS: Symmetric in size. No masses, tenderness, skin changes, nipple drainage, or lymphadenopathy bilaterally. Performed in the presence of a chaperone. ABDOMEN: Soft, no distention noted.  No tenderness, rebound or guarding.  PELVIC: Normal appearing external genitalia and urethral meatus; normal appearing vaginal mucosa and cervix with mild atrophy.  Scant white vaginal discharge noted, testing sample obtained  Pap smear obtained.  Normal uterine size, no other palpable masses, no uterine or adnexal tenderness.  Performed in the presence of a chaperone.   Assessment and Plan:    1. Vaginal discharge - Cervicovaginal ancillary only done, will follow up results and manage accordingly.  2. 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  3. Breast cancer screening by mammogram Mammogram scheduled - MM 3D SCREEN BREAST BILATERAL; Future  4. Well woman exam with routine gynecological exam - Cytology - PAP Will follow up results of pap smear and manage accordingly. Colon cancer screening is up to date. Routine preventative health maintenance measures emphasized. Please refer to After Visit Summary for other counseling recommendations.      Verita Schneiders, MD, [place] for [employer], [place] [facility]

## 2021-03-26 LAB — CERVICOVAGINAL ANCILLARY ONLY
Bacterial Vaginitis (gardnerella): NEGATIVE
Candida Glabrata: NEGATIVE
Candida Vaginitis: NEGATIVE
Comment: NEGATIVE
Comment: NEGATIVE
Comment: NEGATIVE

## 2021-03-30 LAB — CYTOLOGY - PAP
Comment: NEGATIVE
Diagnosis: NEGATIVE
Diagnosis: REACTIVE
High risk HPV: NEGATIVE

## 2021-04-02 DIAGNOSIS — Z1231 Encounter for screening mammogram for malignant neoplasm of breast: Secondary | ICD-10-CM | POA: Diagnosis not present

## 2021-04-02 LAB — HM MAMMOGRAPHY

## 2021-05-05 ENCOUNTER — Other Ambulatory Visit: Payer: Self-pay | Admitting: *Deleted

## 2021-05-05 DIAGNOSIS — D500 Iron deficiency anemia secondary to blood loss (chronic): Secondary | ICD-10-CM

## 2021-05-11 ENCOUNTER — Other Ambulatory Visit: Payer: Self-pay

## 2021-05-11 ENCOUNTER — Inpatient Hospital Stay: Payer: BC Managed Care – PPO | Attending: Oncology

## 2021-05-11 DIAGNOSIS — Q2739 Arteriovenous malformation, other site: Secondary | ICD-10-CM | POA: Diagnosis not present

## 2021-05-11 DIAGNOSIS — D500 Iron deficiency anemia secondary to blood loss (chronic): Secondary | ICD-10-CM | POA: Diagnosis not present

## 2021-05-11 LAB — CBC WITH DIFFERENTIAL/PLATELET
Abs Immature Granulocytes: 0.04 10*3/uL (ref 0.00–0.07)
Basophils Absolute: 0.1 10*3/uL (ref 0.0–0.1)
Basophils Relative: 1 %
Eosinophils Absolute: 0.2 10*3/uL (ref 0.0–0.5)
Eosinophils Relative: 2 %
HCT: 41.9 % (ref 36.0–46.0)
Hemoglobin: 13.9 g/dL (ref 12.0–15.0)
Immature Granulocytes: 1 %
Lymphocytes Relative: 23 %
Lymphs Abs: 2 10*3/uL (ref 0.7–4.0)
MCH: 30.6 pg (ref 26.0–34.0)
MCHC: 33.2 g/dL (ref 30.0–36.0)
MCV: 92.3 fL (ref 80.0–100.0)
Monocytes Absolute: 0.4 10*3/uL (ref 0.1–1.0)
Monocytes Relative: 5 %
Neutro Abs: 6 10*3/uL (ref 1.7–7.7)
Neutrophils Relative %: 68 %
Platelets: 278 10*3/uL (ref 150–400)
RBC: 4.54 MIL/uL (ref 3.87–5.11)
RDW: 13.2 % (ref 11.5–15.5)
WBC: 8.7 10*3/uL (ref 4.0–10.5)
nRBC: 0 % (ref 0.0–0.2)

## 2021-05-11 LAB — IRON AND TIBC
Iron: 69 ug/dL (ref 28–170)
Saturation Ratios: 20 % (ref 10.4–31.8)
TIBC: 354 ug/dL (ref 250–450)
UIBC: 285 ug/dL

## 2021-05-11 LAB — FERRITIN: Ferritin: 37 ng/mL (ref 11–307)

## 2021-05-13 ENCOUNTER — Encounter: Payer: Self-pay | Admitting: Oncology

## 2021-05-13 ENCOUNTER — Ambulatory Visit: Payer: BC Managed Care – PPO | Admitting: Obstetrics and Gynecology

## 2021-05-13 ENCOUNTER — Other Ambulatory Visit: Payer: Self-pay

## 2021-05-13 ENCOUNTER — Inpatient Hospital Stay: Payer: BC Managed Care – PPO | Attending: Oncology | Admitting: Oncology

## 2021-05-13 DIAGNOSIS — D500 Iron deficiency anemia secondary to blood loss (chronic): Secondary | ICD-10-CM

## 2021-05-13 NOTE — Progress Notes (Signed)
HEMATOLOGY-ONCOLOGY TeleHEALTH VISIT PROGRESS NOTE  ?I connected with Meagan Conway on 05/13/21  at  2:45 PM EST by video enabled telemedicine visit and verified that I am speaking with the correct person using two identifiers. ?I discussed the limitations, risks, security and privacy concerns of performing an evaluation and management service by telemedicine and the availability of in-person appointments. The patient expressed understanding and agreed to proceed.  ? ?Other persons participating in the visit and their role in the encounter:  ?None ? ?Patient's location: Home  ?Provider's location: office ?Chief Complaint:  ? ? ?INTERVAL HISTORY ?Meagan Conway is a 61 y.o. female who has above history reviewed by me today presents for follow up visit for iron deficiency anemia ?She reports feeling well. Fatigue has improved.  ? ? ?Review of Systems  ?Constitutional:  Negative for appetite change, chills, fatigue and fever.  ?HENT:   Negative for hearing loss and voice change.   ?Eyes:  Negative for eye problems.  ?Respiratory:  Negative for chest tightness and cough.   ?Cardiovascular:  Negative for chest pain.  ?Gastrointestinal:  Negative for abdominal distention, abdominal pain and blood in stool.  ?Endocrine: Negative for hot flashes.  ?Genitourinary:  Negative for difficulty urinating and frequency.   ?Musculoskeletal:  Negative for arthralgias.  ?Skin:  Negative for itching and rash.  ?Neurological:  Negative for extremity weakness.  ?Hematological:  Negative for adenopathy.  ?Psychiatric/Behavioral:  Negative for confusion.    ?Past Medical History:  ?Diagnosis Date  ? Anemia   ? Depression   ? with anxious features  ? GERD (gastroesophageal reflux disease)   ? Hypertension 05/2005  ? H/O. no meds currently  ? IDA (iron deficiency anemia) 10/03/2018  ? Obesity   ? Vertigo   ? last episode 03/18  ? ?Past Surgical History:  ?Procedure Laterality Date  ? COLONOSCOPY WITH PROPOFOL N/A 08/27/2016  ? Procedure:  COLONOSCOPY WITH PROPOFOL;  Surgeon: Lucilla Lame, MD;  Location: [place];  Service: Endoscopy;  Laterality: N/A;  ? ENDOMETRIAL ABLATION W/ NOVASURE    ? ESOPHAGOGASTRODUODENOSCOPY (EGD) WITH PROPOFOL N/A 08/29/2017  ? Procedure: ESOPHAGOGASTRODUODENOSCOPY (EGD) WITH PROPOFOL;  Surgeon: Lucilla Lame, MD;  Location: [place];  Service: Endoscopy;  Laterality: N/A;  pt request early arrival time  ? GIVENS CAPSULE STUDY N/A 09/28/2017  ? Procedure: GIVENS CAPSULE STUDY;  Surgeon: Lucilla Lame, MD;  Location: [facility] ENDOSCOPY;  Service: Endoscopy;  Laterality: N/A;  ? OVARIAN CYST SURGERY  08/2002  ? [place]  ?  ?Family History  ?Problem Relation Age of Onset  ? Hypertension Mother   ?     died of GI bleed-unexpected   ? Hypertension Father   ? Heart disease Father 62  ?     MI  ? Alcohol abuse Father   ? Heart disease Maternal Grandmother   ? Heart disease Paternal Grandmother   ?  ?Social History  ? ?Socioeconomic History  ? Marital status: Married  ?  Spouse name: Not on file  ? Number of children: Not on file  ? Years of education: Not on file  ? Highest education level: Not on file  ?Occupational History  ? Not on file  ?Tobacco Use  ? Smoking status: Former  ?  Types: Cigarettes  ?  Quit date: 2014  ?  Years since quitting: 9.1  ? Smokeless tobacco: Never  ? Tobacco comments:  ?  quit many years ago.  ?Vaping Use  ? Vaping  Use: Never used  ?Substance and Sexual Activity  ? Alcohol use: Yes  ?  Alcohol/week: 5.0 standard drinks  ?  Types: 2 Glasses of wine, 3 Cans of beer per week  ?  Comment: occ  ? Drug use: No  ? Sexual activity: Yes  ?  Partners: Male  ?  Birth control/protection: None  ?  Comment: husband has a vasectomy  ?Other Topics Concern  ? Not on file  ?Social History Narrative  ? Not on file  ? ?Social Determinants of Health  ? ?Financial Resource Strain: Not on file  ?Food Insecurity: Not on file  ?Transportation Needs: Not on file  ?Physical Activity:  Not on file  ?Stress: Not on file  ?Social Connections: Not on file  ?Intimate Partner Violence: Not on file  ?  ?Current Outpatient Medications on File Prior to Visit  ?Medication Sig Dispense Refill  ? FLUoxetine (PROZAC) 20 MG capsule Take 1 capsule (20 mg total) by mouth daily. 90 capsule 3  ? iron polysaccharides (NIFEREX) 150 MG capsule Take 1 capsule (150 mg total) by mouth 2 (two) times daily. (Patient taking differently: Take 150 mg by mouth daily.) 180 capsule 1  ? lisinopril (ZESTRIL) 10 MG tablet Take 1 tablet (10 mg total) by mouth daily. 90 tablet 3  ? ?No current facility-administered medications on file prior to visit.  ?  ?No Known Allergies  ? ?  ?Observations/Objective: ?There were no vitals filed for this visit. ?There is no height or weight on file to calculate BMI.  ?Physical Exam ?Neurological:  ?   Mental Status: She is alert.  ? ? ?CBC ?   ?Component Value Date/Time  ? WBC 8.7 05/11/2021 1355  ? RBC 4.54 05/11/2021 1355  ? HGB 13.9 05/11/2021 1355  ? HCT 41.9 05/11/2021 1355  ? PLT 278 05/11/2021 1355  ? MCV 92.3 05/11/2021 1355  ? MCH 30.6 05/11/2021 1355  ? MCHC 33.2 05/11/2021 1355  ? RDW 13.2 05/11/2021 1355  ? LYMPHSABS 2.0 05/11/2021 1355  ? MONOABS 0.4 05/11/2021 1355  ? EOSABS 0.2 05/11/2021 1355  ? BASOSABS 0.1 05/11/2021 1355  ?  ?CMP  ?   ?Component Value Date/Time  ? NA 141 08/25/2020 1553  ? K 4.2 08/25/2020 1553  ? CL 104 08/25/2020 1553  ? CO2 29 08/25/2020 1553  ? GLUCOSE 100 (H) 08/25/2020 1553  ? BUN 16 08/25/2020 1553  ? CREATININE 0.84 08/25/2020 1553  ? CREATININE 0.96 08/17/2019 1537  ? CALCIUM 9.0 08/25/2020 1553  ? PROT 6.6 08/17/2019 1537  ? ALBUMIN 4.1 07/19/2018 0849  ? AST 19 08/17/2019 1537  ? ALT 22 08/17/2019 1537  ? ALKPHOS 76 07/19/2018 0849  ? BILITOT 0.4 08/17/2019 1537  ? GFRNONAA >60 06/09/2016 0446  ? GFRAA >60 06/09/2016 0446  ?  ? ?Assessment and Plan: ?1. Iron deficiency anemia due to chronic blood loss   ?  ?Labs are reviewed and discussed with  patient. ?Iron deficiency anemia has resolved.  ?She may stop oral iron supplementation.  ?Recommend her to follow up with primary care provider and repeat cbc and iron panel in 3-4 months.  ? ? ?Follow Up Instructions: ?Follow up in 1 year, or earlier if needed. ? ? ?I discussed the assessment and treatment plan with the patient. The patient was provided an opportunity to ask questions and all were answered. The patient agreed with the plan and demonstrated an understanding of the instructions.  ?The patient was advised to call back or  seek an in-person evaluation if the symptoms worsen or if the condition fails to improve as anticipated.  ? ?Earlie Server, MD 05/13/2021 10:28 PM  ? ?

## 2021-05-13 NOTE — Progress Notes (Signed)
Patient contacted for Mychart visit. No new concens voiced.  ?

## 2021-05-14 ENCOUNTER — Inpatient Hospital Stay: Payer: BC Managed Care – PPO

## 2021-05-22 ENCOUNTER — Other Ambulatory Visit: Payer: Self-pay

## 2021-05-22 ENCOUNTER — Ambulatory Visit (INDEPENDENT_AMBULATORY_CARE_PROVIDER_SITE_OTHER): Payer: BC Managed Care – PPO | Admitting: Obstetrics and Gynecology

## 2021-05-22 ENCOUNTER — Encounter: Payer: Self-pay | Admitting: Obstetrics and Gynecology

## 2021-05-22 VITALS — BP 112/78 | HR 64 | Ht 65.0 in | Wt 234.0 lb

## 2021-05-22 DIAGNOSIS — N823 Fistula of vagina to large intestine: Secondary | ICD-10-CM

## 2021-05-22 NOTE — Progress Notes (Signed)
[facility] ?New Patient Evaluation and Consultation ? ?Referring Provider: Osborne Oman, MD ?PCP: Abner Greenspan, MD ?Date of Service: 05/22/2021 ? ?SUBJECTIVE ?Chief Complaint: New Patient (Initial Visit) Meagan Conway is a 61 y.o. female here for a consult fistula and incontinence./) ? ?History of Present Illness: Meagan Conway is a 61 y.o. White or Caucasian female seen in consultation at the request of Dr. Harolyn Rutherford for evaluation of fistula.   ? ?Review of records significant for: ?Has history of fistula reapair but never completely worked.  ? ?Urinary Symptoms: ?Leaks urine with with movement to the bathroom- rare ? ? ?Day time voids 6.  Nocturia: 2 times per night to void. ?Voiding dysfunction: she empties her bladder well.  ?does not use a catheter to empty bladder.  ?When urinating, she feels she has no difficulties ? ? ?UTIs: 1 UTI's in the last year.   ?Denies history of blood in urine and kidney or bladder stones ? ?Pelvic Organ Prolapse Symptoms:                  ?She Denies a feeling of a bulge the vaginal area.  ? ?Bowel Symptom: ?Bowel movements: 2 time(s) per day ?Stool consistency: soft  ?Straining: no.  ?Splinting: no.  ?Incomplete evacuation: no.  ?She Admits to accidental bowel leakage / fecal incontinence ? Occurs: 3-4 times per week ? Consistency with leakage: soft ?Has bowel leakage when stools are running.  ?Bowel regimen: none ?Last colonoscopy: Date 2018, Results- ulceration biopsied ? 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 She is not aware of what caused the fistula but was present prior to the birth. Had repaired in 2000. Never felt that it closed completely. Sees stool in the vagina when she has loose stool (several times per week).  ? ?Sexual Function ?Sexually active: yes.  ? ?Pain with sex: No ? ?Pelvic Pain ?Denies pelvic pain ? ? ?Past Medical History:  ?Past Medical History:  ?Diagnosis Date  ?  Anemia   ? Depression   ? with anxious features  ? GERD (gastroesophageal reflux disease)   ? Hypertension 05/2005  ? H/O. no meds currently  ? IDA (iron deficiency anemia) 10/03/2018  ? Obesity   ? Vertigo   ? last episode 03/18  ? ? ? ?Past Surgical History:   ?Past Surgical History:  ?Procedure Laterality Date  ? COLONOSCOPY WITH PROPOFOL N/A 08/27/2016  ? Procedure: COLONOSCOPY WITH PROPOFOL;  Surgeon: Lucilla Lame, MD;  Location: [place];  Service: Endoscopy;  Laterality: N/A;  ? ENDOMETRIAL ABLATION W/ NOVASURE    ? ESOPHAGOGASTRODUODENOSCOPY (EGD) WITH PROPOFOL N/A 08/29/2017  ? Procedure: ESOPHAGOGASTRODUODENOSCOPY (EGD) WITH PROPOFOL;  Surgeon: Lucilla Lame, MD;  Location: [place];  Service: Endoscopy;  Laterality: N/A;  pt request early arrival time  ? GIVENS CAPSULE STUDY N/A 09/28/2017  ? Procedure: GIVENS CAPSULE STUDY;  Surgeon: Lucilla Lame, MD;  Location: [facility] ENDOSCOPY;  Service: Endoscopy;  Laterality: N/A;  ? OVARIAN CYST SURGERY  08/14/2002  ? rectovaginal fistula repair    ? ? ? ?Past OB/GYN History: ?OB History  ?Gravida Para Term Preterm AB Living  ?'2 2 2     2  '$ ?SAB IAB Ectopic Multiple Live Births  ?        2  ?  ?# Outcome Date GA Lbr Len/2nd Weight Sex Delivery Anes PTL Lv  ?2 Term 60    M Vag-Spont  LIV  ?1 Term 63    M Vag-Spont   LIV  ? ?Reports episiotomy with her first child ?Any history of abnormal pap smears: no. ?S/p ablation ? ? ?Medications: She has a current medication list which includes the following prescription(s): fluoxetine and lisinopril.  ? ?Allergies: Patient has No Known Allergies.  ? ?Social History:  ?Social History  ? ?Tobacco Use  ? Smoking status: Former  ?  Types: Cigarettes  ?  Quit date: 2014  ?  Years since quitting: 9.1  ? Smokeless tobacco: Never  ? Tobacco comments:  ?  quit many years ago.  ?Vaping Use  ? Vaping Use: Never used  ?Substance Use Topics  ? Alcohol use: Yes  ?  Alcohol/week: 5.0 standard drinks  ?  Types: 2 Glasses  of wine, 3 Cans of beer per week  ?  Comment: occ  ? Drug use: No  ? ? ?Relationship status: married ?She lives with husband.   ?She is employed as an Estate agent. ?Regular exercise: No ?History of abuse: No ? ?Family History:   ?Family History  ?Problem Relation Age of Onset  ? Hypertension Mother   ?     died of GI bleed-unexpected   ? Hypertension Father   ? Heart disease Father 36  ?     MI  ? Alcohol abuse Father   ? Heart disease Maternal Grandmother   ? Heart disease Paternal Grandmother   ? ? ? ?Review of Systems: ROS ? ? ?OBJECTIVE ?Physical Exam: ?Vitals:  ? 05/22/21 1317  ?BP: 112/78  ?Pulse: 64  ?Weight: 234 lb (106.1 kg)  ?Height: '5\' 5"'$  (1.651 m)  ? ? ?Physical Exam ? ? ?GU / Detailed Urogynecologic Evaluation:  ?Pelvic Exam: Normal external female genitalia; Bartholin's and Skene's glands normal in appearance; urethral meatus normal in appearance, no urethral masses or discharge.  ? ?CST: positive ? ?Speculum exam reveals normal vaginal mucosa with atrophy. Stool noted at the introitus to the right, coming through a rugation but unable to identify specific fistula tract. Cervix normal appearance. Uterus normal single, nontender. Adnexa normal adnexa.   ? ? ?Pelvic floor strength II/V, puborectalis III/V external anal sphincter III/V ? ?Pelvic floor musculature: Right levator non-tender, Right obturator non-tender, Left levator non-tender, Left obturator non-tender ? ?POP-Q:  ? ?POP-Q ? ?-2.5  ?                                          Aa   ?-2.5 ?                                          Ba  ?-6.5  ?                                            C  ? ?3  ?                                          Gh  ?4  ?  Pb  ?8  ?                                          tvl  ? ?-2  ?                                          Ap  ?-2  ?                                          Bp  ?-7  ?                                            D  ? ? ? ?Rectal Exam:  ?Normal sphincter tone, no distal  rectocele, enterocoele not present, no rectal masses, no sign of dyssynergia when asking the patient to bear down. ? ?Post-Void Residual (PVR) by Bladder Scan: ?In order to evaluate bladder emptying, we discussed obtaining a postvoid residual and she agreed to this procedure. ? ?Procedure: The ultrasound unit was placed on the patient's abdomen in the suprapubic region after the patient had voided. A PVR of 0 ml was obtained by bladder scan. ? ?Laboratory Results: ?Unable to provide urine ? ?ASSESSMENT AND PLAN ?Ms. Herard is a 61 y.o. with:  ?1. Rectovaginal fistula   ? ?- We reviewed that she likely has a fistula due to presence of stool in the vagina but could not identify specific tract on exam today. Would likely be able to better visualize with exam under anesthesia.  ?- Discussed concern that she has a history of a spontaneous fistula, and this is not usually common unless there is a diagnosis of inflammatory bowel disease. On colonoscopy in 2018, an ulceration was biopsied but did not appear to be definitive for IBD. She also has frequent loose stools. Therefore, recommend repeat evaluation by Gastroenterology to rule out IBD as untreated disease will worsen surgical outcomes and possibly result in surgical failure.  ?- Will have her return after evaluation by GI. We discussed the possibility of surgery to close the fistula.  ? ?Return 4-6 weeks  ? ? ?Jaquita Folds, MD ? ? ?Medical Decision Making:  ?- Review and summation of prior records ? ? ?

## 2021-05-25 ENCOUNTER — Telehealth: Payer: Self-pay

## 2021-05-25 NOTE — Telephone Encounter (Signed)
Scheduled for 09/01/2021 ?

## 2021-05-28 ENCOUNTER — Ambulatory Visit: Payer: BC Managed Care – PPO | Admitting: Obstetrics and Gynecology

## 2021-07-03 ENCOUNTER — Ambulatory Visit: Payer: BC Managed Care – PPO | Admitting: Obstetrics and Gynecology

## 2021-08-18 ENCOUNTER — Telehealth: Payer: Self-pay | Admitting: Family Medicine

## 2021-08-18 DIAGNOSIS — I10 Essential (primary) hypertension: Secondary | ICD-10-CM

## 2021-08-18 DIAGNOSIS — D509 Iron deficiency anemia, unspecified: Secondary | ICD-10-CM

## 2021-08-18 DIAGNOSIS — R7303 Prediabetes: Secondary | ICD-10-CM

## 2021-08-18 DIAGNOSIS — Z79899 Other long term (current) drug therapy: Secondary | ICD-10-CM

## 2021-08-18 NOTE — Telephone Encounter (Signed)
-----   Message from Ellamae Sia sent at 08/05/2021 12:27 PM EDT ----- Regarding: Lab orders for Wednesday, 6.7.23 Patient is scheduled for CPX labs, please order future labs, Thanks , Karna Christmas

## 2021-08-19 ENCOUNTER — Ambulatory Visit (INDEPENDENT_AMBULATORY_CARE_PROVIDER_SITE_OTHER): Payer: BC Managed Care – PPO

## 2021-08-19 ENCOUNTER — Other Ambulatory Visit (INDEPENDENT_AMBULATORY_CARE_PROVIDER_SITE_OTHER): Payer: BC Managed Care – PPO

## 2021-08-19 ENCOUNTER — Ambulatory Visit (INDEPENDENT_AMBULATORY_CARE_PROVIDER_SITE_OTHER): Payer: BC Managed Care – PPO | Admitting: Podiatry

## 2021-08-19 ENCOUNTER — Encounter: Payer: Self-pay | Admitting: Podiatry

## 2021-08-19 DIAGNOSIS — M722 Plantar fascial fibromatosis: Secondary | ICD-10-CM

## 2021-08-19 DIAGNOSIS — Z79899 Other long term (current) drug therapy: Secondary | ICD-10-CM | POA: Diagnosis not present

## 2021-08-19 DIAGNOSIS — R7303 Prediabetes: Secondary | ICD-10-CM

## 2021-08-19 DIAGNOSIS — I10 Essential (primary) hypertension: Secondary | ICD-10-CM

## 2021-08-19 DIAGNOSIS — D509 Iron deficiency anemia, unspecified: Secondary | ICD-10-CM

## 2021-08-19 LAB — COMPREHENSIVE METABOLIC PANEL
ALT: 17 U/L (ref 0–35)
AST: 14 U/L (ref 0–37)
Albumin: 4.1 g/dL (ref 3.5–5.2)
Alkaline Phosphatase: 78 U/L (ref 39–117)
BUN: 14 mg/dL (ref 6–23)
CO2: 28 mEq/L (ref 19–32)
Calcium: 9.2 mg/dL (ref 8.4–10.5)
Chloride: 106 mEq/L (ref 96–112)
Creatinine, Ser: 0.88 mg/dL (ref 0.40–1.20)
GFR: 72.39 mL/min (ref >60.00)
Glucose, Bld: 107 mg/dL — ABNORMAL HIGH (ref 70–99)
Potassium: 4.8 mEq/L (ref 3.5–5.1)
Sodium: 143 mEq/L (ref 135–145)
Total Bilirubin: 0.3 mg/dL (ref 0.2–1.2)
Total Protein: 6.2 g/dL (ref 6.0–8.3)

## 2021-08-19 LAB — CBC WITH DIFFERENTIAL/PLATELET
Basophils Absolute: 0.1 10*3/uL (ref 0.0–0.1)
Basophils Relative: 0.9 % (ref 0.0–3.0)
Eosinophils Absolute: 0.3 10*3/uL (ref 0.0–0.7)
Eosinophils Relative: 3.6 % (ref 0.0–5.0)
HCT: 38.1 % (ref 36.0–46.0)
Hemoglobin: 12.6 g/dL (ref 12.0–15.0)
Lymphocytes Relative: 27.8 % (ref 12.0–46.0)
Lymphs Abs: 2.1 10*3/uL (ref 0.7–4.0)
MCHC: 33 g/dL (ref 30.0–36.0)
MCV: 90.4 fl (ref 78.0–100.0)
Monocytes Absolute: 0.4 10*3/uL (ref 0.1–1.0)
Monocytes Relative: 5.2 % (ref 3.0–12.0)
Neutro Abs: 4.7 10*3/uL (ref 1.4–7.7)
Neutrophils Relative %: 62.5 % (ref 43.0–77.0)
Platelets: 273 10*3/uL (ref 150.0–400.0)
RBC: 4.21 Mil/uL (ref 3.87–5.11)
RDW: 14.3 % (ref 11.5–15.5)
WBC: 7.5 10*3/uL (ref 4.0–10.5)

## 2021-08-19 LAB — VITAMIN B12: Vitamin B-12: 703 pg/mL (ref 211–911)

## 2021-08-19 LAB — LIPID PANEL
Cholesterol: 184 mg/dL (ref 0–200)
HDL: 77.1 mg/dL (ref >39.00)
LDL Cholesterol: 93 mg/dL (ref 0–99)
NonHDL: 106.41
Total CHOL/HDL Ratio: 2
Triglycerides: 68 mg/dL (ref 0.0–149.0)
VLDL: 13.6 mg/dL (ref 0.0–40.0)

## 2021-08-19 LAB — HEMOGLOBIN A1C: Hgb A1c MFr Bld: 5.9 % (ref 4.6–6.5)

## 2021-08-19 LAB — IRON: Iron: 36 ug/dL — ABNORMAL LOW (ref 42–145)

## 2021-08-19 LAB — TSH: TSH: 1.9 u[IU]/mL (ref 0.35–5.50)

## 2021-08-19 MED ORDER — TRIAMCINOLONE ACETONIDE 40 MG/ML IJ SUSP
20.0000 mg | Freq: Once | INTRAMUSCULAR | Status: AC
  Administered 2021-08-19: 20 mg

## 2021-08-19 MED ORDER — METHYLPREDNISOLONE 4 MG PO TBPK: ORAL_TABLET | ORAL | 0 refills | Status: DC

## 2021-08-19 MED ORDER — MELOXICAM 15 MG PO TABS: 15.0000 mg | ORAL_TABLET | Freq: Every day | ORAL | 3 refills | Status: DC

## 2021-08-19 NOTE — Patient Instructions (Signed)

## 2021-08-19 NOTE — Progress Notes (Signed)
Patient ID: Meagan Conway, female    DOB: [birth year],  MRN: [id number] HPI Chief Complaint  Patient presents with   Foot Pain    Plantar heel right - aching x 1-2 months, AM pain and now constant, Ibuprofen PRN   New Patient (Initial Visit)    Est pt 2017    61 y.o. female presents with the above complaint.   ROS: Denies fever chills nausea vomiting muscle aches pains calf pain back pain chest pain shortness of breath.  Past Medical History:  Diagnosis Date   Anemia    Depression    with anxious features   GERD (gastroesophageal reflux disease)    Hypertension 05/2005   H/O. no meds currently   IDA (iron deficiency anemia) 10/03/2018   Obesity    Vertigo    last episode 03/18   Past Surgical History:  Procedure Laterality Date   COLONOSCOPY WITH PROPOFOL N/A 08/27/2016   Procedure: COLONOSCOPY WITH PROPOFOL;  Surgeon: Lucilla Lame, MD;  Location: [place];  Service: Endoscopy;  Laterality: N/A;   ENDOMETRIAL ABLATION W/ NOVASURE     ESOPHAGOGASTRODUODENOSCOPY (EGD) WITH PROPOFOL N/A 08/29/2017   Procedure: ESOPHAGOGASTRODUODENOSCOPY (EGD) WITH PROPOFOL;  Surgeon: Lucilla Lame, MD;  Location: [place];  Service: Endoscopy;  Laterality: N/A;  pt request early arrival time   GIVENS CAPSULE STUDY N/A 09/28/2017   Procedure: GIVENS CAPSULE STUDY;  Surgeon: Lucilla Lame, MD;  Location: [facility] ENDOSCOPY;  Service: Endoscopy;  Laterality: N/A;   OVARIAN CYST SURGERY  08/14/2002   rectovaginal fistula repair      Current Outpatient Medications:    Cholecalciferol (D3 PO), Take by mouth., Disp: , Rfl:    meloxicam (MOBIC) 15 MG tablet, Take 1 tablet (15 mg total) by mouth daily., Disp: 30 tablet, Rfl: 3   methylPREDNISolone (MEDROL DOSEPAK) 4 MG TBPK tablet, 6 day dose pack - take as directed, Disp: 21 tablet, Rfl: 0   FLUoxetine (PROZAC) 20 MG capsule, Take 1 capsule (20 mg total) by mouth daily., Disp: 90 capsule, Rfl: 3   lisinopril (ZESTRIL) 10 MG tablet,  Take 1 tablet (10 mg total) by mouth daily., Disp: 90 tablet, Rfl: 3  No Known Allergies Review of Systems Objective:  There were no vitals filed for this visit.  General: Well developed, nourished, in no acute distress, alert and oriented x3   Dermatological: Skin is warm, dry and supple bilateral. Nails x 10 are well maintained; remaining integument appears unremarkable at this time. There are no open sores, no preulcerative lesions, no rash or signs of infection present.  Vascular: Dorsalis Pedis artery and Posterior Tibial artery pedal pulses are 2/4 bilateral with immedate capillary fill time. Pedal hair growth present. No varicosities and no lower extremity edema present bilateral.   Neruologic: Grossly intact via light touch bilateral. Vibratory intact via tuning fork bilateral. Protective threshold with Semmes Wienstein monofilament intact to all pedal sites bilateral. Patellar and Achilles deep tendon reflexes 2+ bilateral. No Babinski or clonus noted bilateral.   Musculoskeletal: No gross boney pedal deformities bilateral. No pain, crepitus, or limitation noted with foot and ankle range of motion bilateral. Muscular strength 5/5 in all groups tested bilateral.  Pain on palpation medial calcaneal tubercle of the right heel.  No pain on medial and lateral compression of the calcaneus.  Gait: Unassisted, Nonantalgic.    Radiographs:  Radiographs taken today demonstrate osseously mature foot.  Soft tissue increase in density plantar fascial calcaneal insertion site  Assessment Planter fasciitis.  Plan:  Injected the right heel today 20 mg Kenalog 5 mg Marcaine point maximal tenderness.  Tolerated procedure well without complications.  Started on Medrol Dosepak to be followed by meloxicam placed in a night splint and Ace plantar fascia brace.  Discussed appropriate shoe gear stretching exercises ice therapy and shoe gear modifications I will follow-up with her in 1 month Paras Kreider T. [place],  [place]

## 2021-08-26 ENCOUNTER — Ambulatory Visit (INDEPENDENT_AMBULATORY_CARE_PROVIDER_SITE_OTHER): Payer: BC Managed Care – PPO | Admitting: Family Medicine

## 2021-08-26 ENCOUNTER — Encounter: Payer: Self-pay | Admitting: Family Medicine

## 2021-08-26 VITALS — BP 112/68 | HR 60 | Temp 97.5°F | Resp 16 | Ht 64.0 in | Wt 231.4 lb

## 2021-08-26 DIAGNOSIS — Z0000 Encounter for general adult medical examination without abnormal findings: Secondary | ICD-10-CM

## 2021-08-26 DIAGNOSIS — Z1211 Encounter for screening for malignant neoplasm of colon: Secondary | ICD-10-CM

## 2021-08-26 DIAGNOSIS — Z79899 Other long term (current) drug therapy: Secondary | ICD-10-CM

## 2021-08-26 DIAGNOSIS — I10 Essential (primary) hypertension: Secondary | ICD-10-CM

## 2021-08-26 DIAGNOSIS — D509 Iron deficiency anemia, unspecified: Secondary | ICD-10-CM

## 2021-08-26 DIAGNOSIS — F341 Dysthymic disorder: Secondary | ICD-10-CM

## 2021-08-26 DIAGNOSIS — K219 Gastro-esophageal reflux disease without esophagitis: Secondary | ICD-10-CM

## 2021-08-26 DIAGNOSIS — R7303 Prediabetes: Secondary | ICD-10-CM

## 2021-08-26 MED ORDER — LISINOPRIL 10 MG PO TABS
10.0000 mg | ORAL_TABLET | Freq: Every day | ORAL | 3 refills | Status: DC
Start: 2021-08-26 — End: 2022-10-01

## 2021-08-26 NOTE — Progress Notes (Signed)
Subjective:    Patient ID: Meagan Conway, female    DOB: 1961-02-15, 61 y.o.   MRN: [id number]  HPI Here for health maintenance exam and to review chronic medical problems    Wt Readings from Last 3 Encounters:  08/26/21 231 lb 6 oz (105 kg)  05/22/21 234 lb (106.1 kg)  03/24/21 252 lb (114.3 kg)   39.72 kg/m  Doing ok overall  Trying to take care of herself   Lost a little weight  Started with [employer] program  Dislikes it due to limiting produce May return to wt watchers    Immunization History  Administered Date(s) Administered   Influenza Split 02/16/2011   Influenza,inj,Quad PF,6+ Mos 01/07/2015   Influenza-Unspecified 12/14/2019   PPD Test 07/08/2015   Td 11/15/2006   Tdap 01/07/2015    Zoster status : unsure if interested in shingrix   Mammogram per pt July 2022  Self breast exam : no lumps    Pap 03/2021 gyn, nl with neg HPV screen   Colonoscopy 08/2016   Has visit with GI/ Dr Durwin Reges next week  Unsure when next one will be    Mood Takes fluoxetine 20 mg daily for depression/anxiety  Up and down for 2 weeks  A lot going on  Ran out of her fluoxetine and wants to stay off of it now     Not a lot of exercise  Tries to get outside as much as she can  Hopes schedule will slow down   Plantar fasciitis -seeing Dr Milinda Pointer Medrol dose pak Then meloxicam Splint/brace  Injection  Feels better    HTN bp is stable today  No cp or palpitations or headaches or edema  No side effects to medicines  BP Readings from Last 3 Encounters:  08/26/21 112/68  05/22/21 112/78  03/24/21 134/86     Lisinopril 10 mg daily   GERD= has some protonix / takes it as needed    Lab Results  Component Value Date   CREATININE 0.88 08/19/2021   BUN 14 08/19/2021   NA 143 08/19/2021   K 4.8 08/19/2021   CL 106 08/19/2021   CO2 28 08/19/2021     Lab Results  Component Value Date   VITAMINB12 703 08/19/2021    Anemia Lab Results  Component Value Date    WBC 7.5 08/19/2021   HGB 12.6 08/19/2021   HCT 38.1 08/19/2021   MCV 90.4 08/19/2021   PLT 273.0 08/19/2021   Lab Results  Component Value Date   IRON 36 (L) 08/19/2021   TIBC 354 05/11/2021   FERRITIN 37 05/11/2021   Iron infusions in the past  Not taking any iron currently  Sees hematology in aug   Has fatigue/ that is also schedule ralated   Prediabetes Lab Results  Component Value Date   HGBA1C 5.9 08/19/2021  Down from 6.1   Taking vitamin D3   Lab Results  Component Value Date   TSH 1.90 08/19/2021     Cholesterol Lab Results  Component Value Date   CHOL 184 08/19/2021   CHOL 214 (H) 08/17/2019   CHOL 181 07/19/2018   Lab Results  Component Value Date   HDL 77.10 08/19/2021   HDL 83 08/17/2019   HDL 80.50 07/19/2018   Lab Results  Component Value Date   LDLCALC 93 08/19/2021   LDLCALC 111 (H) 08/17/2019   [place] 90 07/19/2018   Lab Results  Component Value Date   TRIG 68.0 08/19/2021  TRIG 94 08/17/2019   TRIG 52.0 07/19/2018   Lab Results  Component Value Date   CHOLHDL 2 08/19/2021   CHOLHDL 2.6 08/17/2019   CHOLHDL 2 07/19/2018   No results found for: "LDLDIRECT"  Patient Active Problem List   Diagnosis Date Noted   Current use of proton pump inhibitor 08/25/2020   Fatigue 08/25/2020   IDA (iron deficiency anemia) 10/03/2018   Special screening for malignant neoplasms, colon    Ulceration of intestine    Iron deficiency anemia 12/29/2014   Colon cancer screening 07/08/2014   Prediabetes 07/04/2013   Other abnormal glucose 07/04/2013   Routine general medical examination at a health care facility 06/04/2011   GERD (gastroesophageal reflux disease) 03/10/2011   Morbid obesity ([place]) 03/10/2011   COLONIC POLYPS 04/22/2010   ANXIETY DEPRESSION 02/28/2008   Hypertension 01/15/2008   Past Medical History:  Diagnosis Date   Anemia    Depression    with anxious features   GERD (gastroesophageal reflux disease)     Hypertension 05/2005   H/O. no meds currently   IDA (iron deficiency anemia) 10/03/2018   Obesity    Vertigo    last episode 03/18   Past Surgical History:  Procedure Laterality Date   COLONOSCOPY WITH PROPOFOL N/A 08/27/2016   Procedure: COLONOSCOPY WITH PROPOFOL;  Surgeon: Lucilla Lame, MD;  Location: [place];  Service: Endoscopy;  Laterality: N/A;   ENDOMETRIAL ABLATION W/ NOVASURE     ESOPHAGOGASTRODUODENOSCOPY (EGD) WITH PROPOFOL N/A 08/29/2017   Procedure: ESOPHAGOGASTRODUODENOSCOPY (EGD) WITH PROPOFOL;  Surgeon: Lucilla Lame, MD;  Location: [place];  Service: Endoscopy;  Laterality: N/A;  pt request early arrival time   GIVENS CAPSULE STUDY N/A 09/28/2017   Procedure: GIVENS CAPSULE STUDY;  Surgeon: Lucilla Lame, MD;  Location: [facility] ENDOSCOPY;  Service: Endoscopy;  Laterality: N/A;   OVARIAN CYST SURGERY  08/14/2002   rectovaginal fistula repair     Social History   Tobacco Use   Smoking status: Former    Types: Cigarettes    Quit date: 2014    Years since quitting: 9.4   Smokeless tobacco: Never   Tobacco comments:    quit many years ago.  Vaping Use   Vaping Use: Never used  Substance Use Topics   Alcohol use: Yes    Alcohol/week: 5.0 standard drinks of alcohol    Types: 2 Glasses of wine, 3 Cans of beer per week    Comment: occ   Drug use: No   Family History  Problem Relation Age of Onset   Hypertension Mother        died of GI bleed-unexpected    Hypertension Father    Heart disease Father 58       MI   Alcohol abuse Father    Heart disease Maternal Grandmother    Heart disease Paternal Grandmother    No Known Allergies Current Outpatient Medications on File Prior to Visit  Medication Sig Dispense Refill   Cholecalciferol (D3 PO) Take by mouth.     meloxicam (MOBIC) 15 MG tablet Take 1 tablet (15 mg total) by mouth daily. 30 tablet 3   methylPREDNISolone (MEDROL DOSEPAK) 4 MG TBPK tablet 6 day dose pack - take as directed 21  tablet 0   No current facility-administered medications on file prior to visit.    Review of Systems  Constitutional:  Positive for fatigue. Negative for activity change, appetite change, fever and unexpected weight change.  HENT:  Negative for congestion, ear pain,  rhinorrhea, sinus pressure and sore throat.   Eyes:  Negative for pain, redness and visual disturbance.  Respiratory:  Negative for cough, shortness of breath and wheezing.   Cardiovascular:  Negative for chest pain and palpitations.  Gastrointestinal:  Negative for abdominal pain, blood in stool, constipation and diarrhea.  Endocrine: Negative for polydipsia and polyuria.  Genitourinary:  Negative for dysuria, frequency and urgency.  Musculoskeletal:  Negative for arthralgias, back pain and myalgias.  Skin:  Negative for pallor and rash.  Allergic/Immunologic: Negative for environmental allergies.  Neurological:  Negative for dizziness, syncope and headaches.  Hematological:  Negative for adenopathy. Does not bruise/bleed easily.  Psychiatric/Behavioral:  Negative for decreased concentration and dysphoric mood. The patient is not nervous/anxious.        Objective:   Physical Exam Constitutional:      General: She is not in acute distress.    Appearance: Normal appearance. She is well-developed. She is not ill-appearing or diaphoretic.  HENT:     Head: Normocephalic and atraumatic.     Right Ear: Tympanic membrane, ear canal and external ear normal.     Left Ear: Tympanic membrane, ear canal and external ear normal.     Nose: Nose normal. No congestion.     Mouth/Throat:     Mouth: Mucous membranes are moist.     Pharynx: Oropharynx is clear. No posterior oropharyngeal erythema.  Eyes:     General: No scleral icterus.    Extraocular Movements: Extraocular movements intact.     Conjunctiva/sclera: Conjunctivae normal.     Pupils: Pupils are equal, round, and reactive to light.  Neck:     Thyroid: No thyromegaly.      Vascular: No carotid bruit or JVD.  Cardiovascular:     Rate and Rhythm: Normal rate and regular rhythm.     Pulses: Normal pulses.     Heart sounds: Normal heart sounds.     No gallop.  Pulmonary:     Effort: Pulmonary effort is normal. No respiratory distress.     Breath sounds: Normal breath sounds. No wheezing.     Comments: Good air exch Chest:     Chest wall: No tenderness.  Abdominal:     General: Bowel sounds are normal. There is no distension or abdominal bruit.     Palpations: Abdomen is soft. There is no mass.     Tenderness: There is no abdominal tenderness.     Hernia: No hernia is present.  Genitourinary:    Comments: Breast and pelvic exams are done by gyn provider Musculoskeletal:        General: No tenderness. Normal range of motion.     Cervical back: Normal range of motion and neck supple. No rigidity. No muscular tenderness.     Right lower leg: No edema.     Left lower leg: No edema.     Comments: No kyphosis   Lymphadenopathy:     Cervical: No cervical adenopathy.  Skin:    General: Skin is warm and dry.     Coloration: Skin is not pale.     Findings: No erythema or rash.     Comments: Solar lentigines diffusely   Neurological:     Mental Status: She is alert. Mental status is at baseline.     Cranial Nerves: No cranial nerve deficit.     Motor: No abnormal muscle tone.     Coordination: Coordination normal.     Gait: Gait normal.     Deep Tendon  Reflexes: Reflexes are normal and symmetric.  Psychiatric:        Mood and Affect: Mood normal.        Cognition and Memory: Cognition and memory normal.           Assessment & Plan:   Problem List Items Addressed This Visit       Cardiovascular and Mediastinum   Hypertension    bp in fair control at this time  BP Readings from Last 1 Encounters:  08/26/21 112/68  No changes needed Most recent labs reviewed  Disc lifstyle change with low sodium diet and exercise  Plan to continue  lisinopril 10 mg daily      Relevant Medications   lisinopril (ZESTRIL) 10 MG tablet     Digestive   GERD (gastroesophageal reflux disease)    Only takes protonix if needed now  Doing better Watching diet         Other   ANXIETY DEPRESSION    Stopped her fluoxetine 20 mg daily due to feeling better  Reviewed stressors/ coping techniques/symptoms/ support sources/ tx options and side effects in detail today  Some stressors       Colon cancer screening    Colonoscopy 2018 Sees GI soon      Current use of proton pump inhibitor    Lab Results  Component Value Date   VITAMINB12 703 08/19/2021  D level is nl  Only taking ppi prn now      Iron deficiency anemia    No recent iron infusions Hematology f/u in aug No or oral iron  Lab Results  Component Value Date   IRON 36 (L) 08/19/2021   TIBC 354 05/11/2021   FERRITIN 37 05/11/2021         Morbid obesity ([place])    Discussed how this problem influences overall health and the risks it imposes  Reviewed plan for weight loss with lower calorie diet (via better food choices and also portion control or program like weight watchers) and exercise building up to or more than 30 minutes 5 days per week including some aerobic activity   Has used [employer] program  Thinks she prefers wt watchers  Enc more exercise       Prediabetes    Lab Results  Component Value Date   HGBA1C 5.9 08/19/2021  disc imp of low glycemic diet and wt loss to prevent DM2       Routine general medical examination at a health care facility - Primary    Reviewed health habits including diet and exercise and skin cancer prevention Reviewed appropriate screening tests for age  Also reviewed health mt list, fam hx and immunization status , as well as social and family history   See HPI Labs reviewed  Disc pros/cons of shingrix vaccine  Mammogram is due next month Pap utd Colonoscopy 2018 with GI appt next week Encouraged more exercise

## 2021-08-26 NOTE — Patient Instructions (Addendum)
If you are interested in the shingles vaccine series (Shingrix), call your insurance or pharmacy to check on coverage and location it must be given.  If affordable - you can schedule it here or at your pharmacy depending on coverage    Take care of yourself  Think about weight watchers   Stop at check out and we will send for your mammogram report   Be as active as you can be    Try to get most of your carbohydrates from produce (with the exception of white potatoes)  Eat less bread/pasta/rice/snack foods/cereals/sweets and other items from the middle of the grocery store (processed carbs)

## 2021-08-27 NOTE — Assessment & Plan Note (Signed)
bp in fair control at this time  BP Readings from Last 1 Encounters:  08/26/21 112/68   No changes needed Most recent labs reviewed  Disc lifstyle change with low sodium diet and exercise  Plan to continue lisinopril 10 mg daily

## 2021-08-27 NOTE — Assessment & Plan Note (Signed)
Discussed how this problem influences overall health and the risks it imposes  Reviewed plan for weight loss with lower calorie diet (via better food choices and also portion control or program like weight watchers) and exercise building up to or more than 30 minutes 5 days per week including some aerobic activity   Has used [employer]avia program  Thinks she prefers wt watchers  Enc more exercise

## 2021-08-27 NOTE — Assessment & Plan Note (Signed)
Reviewed health habits including diet and exercise and skin cancer prevention Reviewed appropriate screening tests for age  Also reviewed health mt list, fam hx and immunization status , as well as social and family history   See HPI Labs reviewed  Disc pros/cons of shingrix vaccine  Mammogram is due next month Pap utd Colonoscopy 2018 with GI appt next week Encouraged more exercise

## 2021-08-27 NOTE — Assessment & Plan Note (Signed)
Lab Results  Component Value Date   HGBA1C 5.9 08/19/2021   disc imp of low glycemic diet and wt loss to prevent DM2

## 2021-08-27 NOTE — Assessment & Plan Note (Signed)
Colonoscopy 2018 Sees GI soon

## 2021-08-27 NOTE — Assessment & Plan Note (Signed)
Only takes protonix if needed now  Doing better Watching diet

## 2021-08-27 NOTE — Assessment & Plan Note (Signed)
No recent iron infusions Hematology f/u in aug No or oral iron  Lab Results  Component Value Date   IRON 36 (L) 08/19/2021   TIBC 354 05/11/2021   FERRITIN 37 05/11/2021

## 2021-08-27 NOTE — Assessment & Plan Note (Signed)
Lab Results  Component Value Date   [id number] 314 08/19/2021   D level is nl  Only taking ppi prn now

## 2021-08-27 NOTE — Assessment & Plan Note (Signed)
Stopped her fluoxetine 20 mg daily due to feeling better  Reviewed stressors/ coping techniques/symptoms/ support sources/ tx options and side effects in detail today  Some stressors

## 2021-09-01 ENCOUNTER — Encounter: Payer: Self-pay | Admitting: Gastroenterology

## 2021-09-01 ENCOUNTER — Ambulatory Visit (INDEPENDENT_AMBULATORY_CARE_PROVIDER_SITE_OTHER): Payer: BC Managed Care – PPO | Admitting: Gastroenterology

## 2021-09-01 VITALS — BP 113/73 | HR 66 | Temp 98.4°F | Ht 64.0 in | Wt 230.0 lb

## 2021-09-01 DIAGNOSIS — N823 Fistula of vagina to large intestine: Secondary | ICD-10-CM | POA: Diagnosis not present

## 2021-09-01 MED ORDER — NA SULFATE-K SULFATE-MG SULF 17.5-3.13-1.6 GM/177ML PO SOLN: 1.0000 | Freq: Once | ORAL | 0 refills | Status: AC

## 2021-09-01 NOTE — Progress Notes (Signed)
    Primary Care Physician: Tower, Wynelle Fanny, MD  Primary Gastroenterologist:  Dr. Lucilla Lame  Chief Complaint  Patient presents with   Follow-up    HPI: Meagan Conway is a 61 y.o. female here with a history of a colonoscopy done back in 2018 with a transverse colon ulcer seen at that time.  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  The biopsy showed acute active inflammation without any signs of chronicity indicative of inflammatory bowel disease.  The patient reports that she has always had loose bowel movements but denies any rectal bleeding.  The patient's loose bowel movements are intermittent and not all the time.  There is no report of any unexplained weight loss fevers chills nausea vomiting.  Past Medical History:  Diagnosis Date   Anemia    Depression    with anxious features   GERD (gastroesophageal reflux disease)    Hypertension 05/2005   H/O. no meds currently   IDA (iron deficiency anemia) 10/03/2018   Obesity    Vertigo    last episode 03/18    Current Outpatient Medications  Medication Sig Dispense Refill   Cholecalciferol (D3 PO) Take by mouth.     lisinopril (ZESTRIL) 10 MG tablet Take 1 tablet (10 mg total) by mouth daily. 90 tablet 3   meloxicam (MOBIC) 15 MG tablet Take 1 tablet (15 mg total) by mouth daily. 30 tablet 3   No current facility-administered medications for this visit.    Allergies as of 09/01/2021   (No Known Allergies)    ROS:  General: Negative for anorexia, weight loss, fever, chills, fatigue, weakness. ENT: Negative for hoarseness, difficulty swallowing , nasal congestion. CV: Negative for chest pain, angina, palpitations, dyspnea on exertion, peripheral edema.  Respiratory: Negative for dyspnea at rest, dyspnea on exertion, cough, sputum, wheezing.  GI: See history of present illness. GU:  Negative for dysuria,  hematuria, urinary incontinence, urinary frequency, nocturnal urination.  Endo: Negative for unusual weight change.    Physical Examination:   There were no vitals taken for this visit.  General: Well-nourished, well-developed in no acute distress.  Eyes: No icterus. Conjunctivae pink. Neuro: Alert and oriented x 3.  Grossly intact. Psych: Alert and cooperative, normal mood and affect.  Labs:    Imaging Studies: DG Foot Complete Right  Result Date: 08/19/2021 Please see detailed radiograph report in office note.   Assessment and Plan:   Meagan Conway is a 61 y.o. y/o female who comes in today with a history of a rectovaginal fistula with a referral for ruling out Crohn's disease.  The patient does not report any unexplained weight loss fevers chills nausea vomiting black stools or bloody stools.  She does have intermittent loose bowels.  The patient's hemoglobin hematocrit are normal.  The patient also had a colonoscopy by me back in 2019 with a tubular adenoma.  The patient will be set up for repeat colonoscopy for evaluation for possible Crohn's disease.  The patient has been explained the plan and agrees with it.     Lucilla Lame, MD. [name]    Note: This dictation was prepared with Dragon dictation along with smaller phrase technology. Any transcriptional errors that result from this process are unintentional.

## 2021-09-01 NOTE — Addendum Note (Signed)
Addended by: Lurlean Nanny on: 09/01/2021 02:33 PM   Modules accepted: Orders

## 2021-09-07 ENCOUNTER — Encounter: Payer: Self-pay | Admitting: Gastroenterology

## 2021-09-18 ENCOUNTER — Ambulatory Visit: Payer: BC Managed Care – PPO | Admitting: Anesthesiology

## 2021-09-18 ENCOUNTER — Encounter
Admission: RE | Disposition: A | Discharge status: Home / Self Care | Payer: Self-pay | Source: Home / Self Care | Attending: Gastroenterology

## 2021-09-18 ENCOUNTER — Other Ambulatory Visit: Payer: Self-pay

## 2021-09-18 ENCOUNTER — Ambulatory Visit
Admission: RE | Admit: 2021-09-18 | Discharge: 2021-09-18 | Disposition: A | Discharge status: Home / Self Care | Payer: BC Managed Care – PPO | Attending: Gastroenterology | Admitting: Gastroenterology

## 2021-09-18 DIAGNOSIS — E669 Obesity, unspecified: Secondary | ICD-10-CM | POA: Insufficient documentation

## 2021-09-18 DIAGNOSIS — K640 First degree hemorrhoids: Secondary | ICD-10-CM | POA: Insufficient documentation

## 2021-09-18 DIAGNOSIS — K219 Gastro-esophageal reflux disease without esophagitis: Secondary | ICD-10-CM | POA: Insufficient documentation

## 2021-09-18 DIAGNOSIS — K633 Ulcer of intestine: Secondary | ICD-10-CM | POA: Insufficient documentation

## 2021-09-18 DIAGNOSIS — N823 Fistula of vagina to large intestine: Secondary | ICD-10-CM

## 2021-09-18 DIAGNOSIS — Z87891 Personal history of nicotine dependence: Secondary | ICD-10-CM | POA: Diagnosis not present

## 2021-09-18 DIAGNOSIS — K529 Noninfective gastroenteritis and colitis, unspecified: Secondary | ICD-10-CM | POA: Insufficient documentation

## 2021-09-18 DIAGNOSIS — Z6838 Body mass index (BMI) 38.0-38.9, adult: Secondary | ICD-10-CM | POA: Diagnosis not present

## 2021-09-18 DIAGNOSIS — R194 Change in bowel habit: Secondary | ICD-10-CM | POA: Diagnosis not present

## 2021-09-18 DIAGNOSIS — I10 Essential (primary) hypertension: Secondary | ICD-10-CM | POA: Diagnosis not present

## 2021-09-18 DIAGNOSIS — K5730 Diverticulosis of large intestine without perforation or abscess without bleeding: Secondary | ICD-10-CM | POA: Diagnosis not present

## 2021-09-18 HISTORY — PX: COLONOSCOPY: SHX5424

## 2021-09-18 SURGERY — COLONOSCOPY
Anesthesia: General | Site: Rectum

## 2021-09-18 MED ORDER — LACTATED RINGERS IV SOLN: INTRAVENOUS | Status: DC

## 2021-09-18 MED ORDER — STERILE WATER FOR IRRIGATION IR SOLN
Status: DC | PRN
  Administered 2021-09-18: 150 mL

## 2021-09-18 MED ORDER — LIDOCAINE HCL (CARDIAC) PF 100 MG/5ML IV SOSY
PREFILLED_SYRINGE | INTRAVENOUS | Status: DC | PRN
  Administered 2021-09-18: 40 mg via INTRAVENOUS

## 2021-09-18 MED ORDER — SODIUM CHLORIDE 0.9 % IV SOLN: INTRAVENOUS | Status: DC

## 2021-09-18 MED ORDER — PROPOFOL 10 MG/ML IV BOLUS
INTRAVENOUS | Status: DC | PRN
  Administered 2021-09-18: 100 mg via INTRAVENOUS
  Administered 2021-09-18: 30 mg via INTRAVENOUS

## 2021-09-18 SURGICAL SUPPLY — 22 items

## 2021-09-18 NOTE — H&P (Signed)
Meagan Lame, Meagan Conway [facility] [address], [place], [place] [address] [phone] Fax : [phone]  Primary Care Physician:  Tower, Wynelle Fanny, Meagan Conway Primary Gastroenterologist:  Dr. Allen Norris  Pre-Procedure History & Physical: HPI:  Meagan Conway is a 61 y.o. female is here for an colonoscopy.   Past Medical History:  Diagnosis Date   Anemia    Depression    with anxious features   GERD (gastroesophageal reflux disease)    Hypertension 05/2005   H/O. no meds currently   IDA (iron deficiency anemia) 10/03/2018   Obesity    Vertigo    last episode 03/18    Past Surgical History:  Procedure Laterality Date   COLONOSCOPY WITH PROPOFOL N/A 08/27/2016   Procedure: COLONOSCOPY WITH PROPOFOL;  Surgeon: Meagan Lame, Meagan Conway;  Location: [facility];  Service: Endoscopy;  Laterality: N/A;   ENDOMETRIAL ABLATION W/ NOVASURE     ESOPHAGOGASTRODUODENOSCOPY (EGD) WITH PROPOFOL N/A 08/29/2017   Procedure: ESOPHAGOGASTRODUODENOSCOPY (EGD) WITH PROPOFOL;  Surgeon: Meagan Lame, Meagan Conway;  Location: [facility];  Service: Endoscopy;  Laterality: N/A;  pt request early arrival time   GIVENS CAPSULE STUDY N/A 09/28/2017   Procedure: GIVENS CAPSULE STUDY;  Surgeon: Meagan Lame, Meagan Conway;  Location: [facility] ENDOSCOPY;  Service: Endoscopy;  Laterality: N/A;   OVARIAN CYST SURGERY  08/14/2002   rectovaginal fistula repair      Prior to Admission medications   Medication Sig Start Date End Date Taking? Authorizing Provider  Cholecalciferol (D3 PO) Take by mouth.   Yes Provider, Historical, Meagan Conway  lisinopril (ZESTRIL) 10 MG tablet Take 1 tablet (10 mg total) by mouth daily. 08/26/21  Yes Tower, Wynelle Fanny, Meagan Conway  meloxicam (MOBIC) 15 MG tablet Take 1 tablet (15 mg total) by mouth daily. 08/19/21  Yes Hyatt, Max T, DPM    Allergies as of 09/01/2021   (No Known Allergies)    Family History  Problem Relation Age of Onset   Hypertension Mother        died of GI bleed-unexpected    Hypertension Father     Heart disease Father 39       MI   Alcohol abuse Father    Heart disease Maternal Grandmother    Heart disease Paternal Grandmother     Social History   Socioeconomic History   Marital status: Married    Spouse name: Not on file   Number of children: Not on file   Years of education: Not on file   Highest education level: Not on file  Occupational History   Not on file  Tobacco Use   Smoking status: Former    Types: Cigarettes    Quit date: 2014    Years since quitting: 9.5   Smokeless tobacco: Never   Tobacco comments:    quit many years ago.  Vaping Use   Vaping Use: Never used  Substance and Sexual Activity   Alcohol use: Yes    Alcohol/week: 5.0 standard drinks of alcohol    Types: 2 Glasses of wine, 3 Cans of beer per week    Comment: occ   Drug use: No   Sexual activity: Yes    Partners: Male    Birth control/protection: None    Comment: husband has a vasectomy  Other Topics Concern   Not on file  Social History Narrative   Not on file   Social Determinants of Health   Financial Resource Strain: Not on file  Food Insecurity: Not on file  Transportation  Needs: Not on file  Physical Activity: Not on file  Stress: Not on file  Social Connections: Not on file  Intimate Partner Violence: Not on file    Review of Systems: See HPI, otherwise negative ROS  Physical Exam: BP 136/89   Pulse 77   Temp (!) 97.2 F (36.2 C) (Temporal)   Resp 16   Ht '5\' 4"'$  (1.626 m)   Wt 102.1 kg   SpO2 96%   BMI 38.62 kg/m  General:   Alert,  pleasant and cooperative in NAD Head:  Normocephalic and atraumatic. Neck:  Supple; no masses or thyromegaly. Lungs:  Clear throughout to auscultation.    Heart:  Regular rate and rhythm. Abdomen:  Soft, nontender and nondistended. Normal bowel sounds, without guarding, and without rebound.   Neurologic:  Alert and  oriented x4;  grossly normal neurologically.  Impression/Plan: Meagan Conway is here for an colonoscopy to  be performed for change in bowel habits  Risks, benefits, limitations, and alternatives regarding  colonoscopy have been reviewed with the patient.  Questions have been answered.  All parties agreeable.   Meagan Lame, Meagan Conway  09/18/2021, 8:34 AM

## 2021-09-18 NOTE — Transfer of Care (Signed)
Immediate Anesthesia Transfer of Care Note  Patient: Meagan Conway  Procedure(s) Performed: COLONOSCOPY (Rectum)  Patient Location: PACU  Anesthesia Type: General  Level of Consciousness: awake, alert  and patient cooperative  Airway and Oxygen Therapy: Patient Spontanous Breathing and Patient connected to supplemental oxygen  Post-op Assessment: Post-op Vital signs reviewed, Patient's Cardiovascular Status Stable, Respiratory Function Stable, Patent Airway and No signs of Nausea or vomiting  Post-op Vital Signs: Reviewed and stable  Complications: No notable events documented.

## 2021-09-18 NOTE — Anesthesia Postprocedure Evaluation (Signed)
Anesthesia Post Note  Patient: Meagan Conway  Procedure(s) Performed: COLONOSCOPY (Rectum)     Patient location during evaluation: PACU Anesthesia Type: General Level of consciousness: awake and alert Pain management: pain level controlled Vital Signs Assessment: post-procedure vital signs reviewed and stable Respiratory status: spontaneous breathing and nonlabored ventilation Cardiovascular status: blood pressure returned to baseline Postop Assessment: no apparent nausea or vomiting Anesthetic complications: no   No notable events documented.  Beulah Matusek [employer]

## 2021-09-18 NOTE — Anesthesia Preprocedure Evaluation (Signed)
Anesthesia Evaluation  Patient identified by MRN, date of birth, ID band Patient awake    Reviewed: Allergy & Precautions, NPO status , Patient's Chart, lab work & pertinent test results  Airway Mallampati: II  TM Distance: >3 FB Neck ROM: Full    Dental no notable dental hx.    Pulmonary former smoker,    Pulmonary exam normal        Cardiovascular hypertension, Normal cardiovascular exam     Neuro/Psych Depression    GI/Hepatic Neg liver ROS, GERD  Medicated,  Endo/Other  BMI 38  Renal/GU negative Renal ROS     Musculoskeletal negative musculoskeletal ROS (+)   Abdominal (+) + obese,   Peds  Hematology  (+) Blood dyscrasia, anemia ,   Anesthesia Other Findings   Reproductive/Obstetrics                             Anesthesia Physical Anesthesia Plan  ASA: 2  Anesthesia Plan: General   Post-op Pain Management: Minimal or no pain anticipated   Induction: Intravenous  PONV Risk Score and Plan: 3 and TIVA, Propofol infusion and Treatment may vary due to age or medical condition  Airway Management Planned: Natural Airway and Nasal Cannula  Additional Equipment:   Intra-op Plan:   Post-operative Plan:   Informed Consent: I have reviewed the patients History and Physical, chart, labs and discussed the procedure including the risks, benefits and alternatives for the proposed anesthesia with the patient or authorized representative who has indicated his/her understanding and acceptance.     Dental advisory given  Plan Discussed with: CRNA  Anesthesia Plan Comments:         Anesthesia Quick Evaluation

## 2021-09-18 NOTE — Op Note (Signed)
[facility] Gastroenterology Patient Name: Meagan Conway Procedure Date: 09/18/2021 8:37 AM MRN: [id number] Account #: [id number] Date of Birth: March 26, 1960 Admit Type: Outpatient Age: 61 Room: [facility] OR ROOM 01 Gender: Female Note Status: Finalized Instrument Name: [id number] Procedure:             Colonoscopy Indications:           Change in bowel habits, Rectovaginal fistula,                         Incidental change in bowel habits noted Providers:             Lucilla Lame MD, MD Referring MD:          Lucilla Lame MD, MD (Referring MD), Wynelle Fanny. Tower                         (Referring MD) Medicines:             Propofol per Anesthesia Complications:         No immediate complications. Procedure:             Pre-Anesthesia Assessment:                        - Prior to the procedure, a History and Physical was                         performed, and patient medications and allergies were                         reviewed. The patient's tolerance of previous                         anesthesia was also reviewed. The risks and benefits                         of the procedure and the sedation options and risks                         were discussed with the patient. All questions were                         answered, and informed consent was obtained. Prior                         Anticoagulants: The patient has taken no previous                         anticoagulant or antiplatelet agents. ASA Grade                         Assessment: II - A patient with mild systemic disease.                         After reviewing the risks and benefits, the patient                         was deemed in satisfactory condition to undergo the  procedure.                        After obtaining informed consent, the colonoscope was                         passed under direct vision. Throughout the procedure,                         the patient's blood pressure,  pulse, and oxygen                         saturations were monitored continuously. The                         Colonoscope was introduced through the anus and                         advanced to the the terminal ileum. The colonoscopy                         was performed without difficulty. The patient                         tolerated the procedure well. The quality of the bowel                         preparation was excellent. Findings:      The perianal and digital rectal examinations were normal.      The terminal ileum contained a few ulcers. No bleeding was present.       Biopsies were taken with a cold forceps for histology.      Multiple ulcers were found in the transverse colon and in the cecum. No       bleeding was present. Biopsies were taken with a cold forceps for       histology.      Multiple small-mouthed diverticula were found in the sigmoid colon.      Non-bleeding internal hemorrhoids were found during retroflexion. The       hemorrhoids were Grade I (internal hemorrhoids that do not prolapse). Impression:            - A few ulcers in the terminal ileum. Biopsied.                        - Multiple ulcers in the transverse colon and in the                         cecum. Biopsied.                        - Diverticulosis in the sigmoid colon.                        - Non-bleeding internal hemorrhoids. Recommendation:        - Discharge patient to home.                        - Resume previous diet.                        -  Continue present medications.                        - Await pathology results. 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 All rights reserved. The codes documented in this report are preliminary and upon coder review may  be revised to meet current compliance requirements. Lucilla Lame MD, MD 09/18/2021 9:10:15 AM This report has been signed electronically. Number of Addenda: 0 Note Initiated On: 09/18/2021 8:37 AM Scope Withdrawal Time: 0 hours 9 minutes 28 seconds  Total Procedure Duration: 0 hours 15 minutes 52 seconds  Estimated Blood Loss:  Estimated blood loss: none.      [facility]

## 2021-09-21 ENCOUNTER — Encounter: Payer: Self-pay | Admitting: Gastroenterology

## 2021-09-22 ENCOUNTER — Encounter: Payer: Self-pay | Admitting: Obstetrics and Gynecology

## 2021-09-22 LAB — SURGICAL PATHOLOGY

## 2021-09-23 ENCOUNTER — Other Ambulatory Visit: Payer: Self-pay

## 2021-09-24 ENCOUNTER — Telehealth: Payer: Self-pay

## 2021-09-24 DIAGNOSIS — N823 Fistula of vagina to large intestine: Secondary | ICD-10-CM

## 2021-09-24 NOTE — Telephone Encounter (Signed)
Per Dr Allen Norris   Next steps : stop nsaids  mr enterogram with mri pelvis to look for small bowel inflammation in a few weeks when off nsaids. Diagnosis recto vaginal fistula.

## 2021-09-25 ENCOUNTER — Encounter: Payer: Self-pay | Admitting: Obstetrics and Gynecology

## 2021-09-25 ENCOUNTER — Ambulatory Visit (INDEPENDENT_AMBULATORY_CARE_PROVIDER_SITE_OTHER): Payer: BC Managed Care – PPO | Admitting: Obstetrics and Gynecology

## 2021-09-25 VITALS — BP 122/87 | HR 67

## 2021-09-25 DIAGNOSIS — N823 Fistula of vagina to large intestine: Secondary | ICD-10-CM | POA: Diagnosis not present

## 2021-09-25 NOTE — Addendum Note (Signed)
Addended by: Lurlean Nanny on: 09/25/2021 02:14 PM   Modules accepted: Orders

## 2021-09-25 NOTE — Telephone Encounter (Signed)
MRI scheduled at [facility], NPO 4 hours  Tues April [address] [facility] Arrive at 7:30

## 2021-09-25 NOTE — Telephone Encounter (Signed)
Pt is aware and expressed understanding.... I will schedule imaging and notify pt via Mychart

## 2021-09-25 NOTE — Progress Notes (Signed)
[facility] Return Visit  SUBJECTIVE  History of Present Illness: Meagan Conway is a 61 y.o. female seen in follow-up for rectovaginal fistula. She was referred to GI to rule out IBD.   Pt had a colonoscopy on 09/18/21 and had findings of ulcerative areas. Biopsies were taken which showed: A.  TERMINAL ILEUM; BIOPSIES:  - MODERATE ACUTE ILEITIS (SEE COMMENT).  - NO SIGNIFICANT ATYPIA.   B.  COLON, CLINICALLY ULCERS; BIOPSIES:  - FOCAL MUCOSAL ULCERATION WITH ADHERENT FIBRINOPURLULENT DEBRIS.  - UN-ULCERATED FRAGMENTS WITH MILD ACUTE COLITIS (SEE COMMENT).  - NO SIGNIFICANT ATYPIA OR MALIGNANCY.   C.  COLON; RANDOM BIOPSIES:  - FRAGMENT OF ULCERATED GRANULATION TISSUE.  - MULTIPLE ADDITIONAL FRAGMENTS OF COLONIC MUCOSA WITH NO SPECIFIC  PATHOLOGIC ABNORMALITY.  - NO SIGNIFICANT ATYPIA OR MALIGNANCY.   Comment: Part A shows acute ileitis and part B shows ulceration with  background fragments of colonic mucosa that do show mild acute colitis.  There is a single fragment in B that shows minimal architectural  distortion.  There are no well-formed granuloma.  The differential for  the findings does include inflammatory bowel disease and the pattern  present would be that of Crohn's disease.  However the findings are  focal and architectural distortion is minimal.  Other entities in the  differential would be infectious colitis, NSAIDs and segmental colitis  associated with diverticulitis.  Clinical correlation is required.   She was taking meloxicam for a few weeks prior to the colonoscopy, and would occasionally take other NSAIDs. Was called by GI about results and plan is to stop NSAIDs for a few weeks then have an MR Enterogram to assess for bowel inflammation.   Fistula symptoms are unchanged.   Past Medical History: Patient  has a past medical history of Anemia, Depression, GERD (gastroesophageal reflux disease), Hypertension (05/2005), IDA (iron deficiency  anemia) (10/03/2018), Obesity, and Vertigo.   Past Surgical History: She  has a past surgical history that includes Ovarian cyst surgery (08/14/2002); Endometrial ablation w/ novasure; Colonoscopy with propofol (N/A, 08/27/2016); Esophagogastroduodenoscopy (egd) with propofol (N/A, 08/29/2017); Givens capsule study (N/A, 09/28/2017); rectovaginal fistula repair; and Colonoscopy (N/A, 09/18/2021).   Medications: She has a current medication list which includes the following prescription(s): cholecalciferol and lisinopril.   Allergies: Patient has No Known Allergies.   Social History: Patient  reports that she quit smoking about 9 years ago. Her smoking use included cigarettes. She has never used smokeless tobacco. She reports current alcohol use of about 5.0 standard drinks of alcohol per week. She reports that she does not use drugs.      OBJECTIVE     Physical Exam: Vitals:   09/25/21 1256  BP: 122/87  Pulse: 67   Gen: No apparent distress, A&O x 3.  Detailed Urogynecologic Evaluation:  Deferred.    ASSESSMENT AND PLAN    Meagan Conway is a 61 y.o. with:  1. Rectovaginal fistula    - We discussed that although she does not definitively have IBD, there is inflammation present in the colon that could disrupt healing with fistula repair. Therefore, will await results of MR Enterogram to see if there is any further treatment recommendations from GI. This will ensure optimal opportunity for good healing after surgery.  - If MRI is clear, can proceed with scheduling surgery for rectovaginal fistula repair. She will notify us of the results.  - Will plan for pre op visit prior to surgery.   Jaquita Folds, MD  Time  spent: I spent 22 minutes dedicated to the care of this patient on the date of this encounter to include pre-visit review of records, face-to-face time with the patient and post visit documentation.

## 2021-09-25 NOTE — Addendum Note (Signed)
Addended by: Lurlean Nanny on: 09/25/2021 12:48 PM   Modules accepted: Orders

## 2021-09-28 ENCOUNTER — Ambulatory Visit: Payer: BC Managed Care – PPO | Admitting: Podiatry

## 2021-10-05 ENCOUNTER — Other Ambulatory Visit: Payer: Self-pay

## 2021-10-05 ENCOUNTER — Telehealth: Payer: Self-pay | Admitting: Gastroenterology

## 2021-10-05 ENCOUNTER — Other Ambulatory Visit: Payer: Self-pay | Admitting: Gastroenterology

## 2021-10-05 DIAGNOSIS — Z01818 Encounter for other preprocedural examination: Secondary | ICD-10-CM

## 2021-10-05 MED ORDER — DIAZEPAM 5 MG PO TABS: ORAL_TABLET | ORAL | 0 refills | Status: DC

## 2021-10-05 NOTE — Telephone Encounter (Signed)
Patient requests a prescription for her upcoming MRI, something to calm her nerves.

## 2021-10-06 ENCOUNTER — Encounter: Payer: Self-pay | Admitting: Family Medicine

## 2021-10-06 ENCOUNTER — Ambulatory Visit
Admission: RE | Admit: 2021-10-06 | Discharge: 2021-10-06 | Disposition: A | Discharge status: Home / Self Care | Payer: BC Managed Care – PPO | Source: Ambulatory Visit | Attending: Gastroenterology | Admitting: Gastroenterology

## 2021-10-06 DIAGNOSIS — N281 Cyst of kidney, acquired: Secondary | ICD-10-CM | POA: Diagnosis not present

## 2021-10-06 DIAGNOSIS — N823 Fistula of vagina to large intestine: Secondary | ICD-10-CM | POA: Insufficient documentation

## 2021-10-06 DIAGNOSIS — K449 Diaphragmatic hernia without obstruction or gangrene: Secondary | ICD-10-CM | POA: Diagnosis not present

## 2021-10-06 DIAGNOSIS — K5190 Ulcerative colitis, unspecified, without complications: Secondary | ICD-10-CM | POA: Diagnosis not present

## 2021-10-06 DIAGNOSIS — K529 Noninfective gastroenteritis and colitis, unspecified: Secondary | ICD-10-CM | POA: Diagnosis not present

## 2021-10-06 DIAGNOSIS — K8020 Calculus of gallbladder without cholecystitis without obstruction: Secondary | ICD-10-CM | POA: Diagnosis not present

## 2021-10-06 DIAGNOSIS — Z8742 Personal history of other diseases of the female genital tract: Secondary | ICD-10-CM | POA: Diagnosis not present

## 2021-10-06 MED ORDER — GADOBUTROL 1 MMOL/ML IV SOLN
10.0000 mL | Freq: Once | INTRAVENOUS | Status: AC | PRN
Start: 2021-10-06 — End: 2021-10-06
  Administered 2021-10-06: 10 mL via INTRAVENOUS

## 2021-10-06 MED ORDER — FLUOXETINE HCL 20 MG PO CAPS
20.0000 mg | ORAL_CAPSULE | Freq: Every day | ORAL | 3 refills | Status: DC
Start: 2021-10-06 — End: 2022-11-24

## 2021-10-06 MED ORDER — FLUOXETINE HCL 20 MG PO CAPS: 20.0000 mg | ORAL_CAPSULE | Freq: Every day | ORAL | 0 refills | Status: DC

## 2021-10-06 NOTE — Telephone Encounter (Signed)
Returned pt's call regarding requesting something to help her nerves prior to the MRI. Diazepam '5mg'$  was sent to [facility], [place]. Ok'd per Dr. Sherri Sear.

## 2021-10-15 ENCOUNTER — Encounter: Payer: Self-pay | Admitting: Gastroenterology

## 2021-10-21 ENCOUNTER — Ambulatory Visit
Admission: RE | Admit: 2021-10-21 | Discharge: 2021-10-21 | Disposition: A | Discharge status: Home / Self Care | Payer: BC Managed Care – PPO | Source: Ambulatory Visit | Attending: Family Medicine | Admitting: Family Medicine

## 2021-10-21 ENCOUNTER — Ambulatory Visit (INDEPENDENT_AMBULATORY_CARE_PROVIDER_SITE_OTHER): Payer: BC Managed Care – PPO

## 2021-10-21 ENCOUNTER — Other Ambulatory Visit: Payer: Self-pay

## 2021-10-21 ENCOUNTER — Ambulatory Visit: Payer: BC Managed Care – PPO | Admitting: Podiatry

## 2021-10-21 VITALS — BP 127/85 | HR 64 | Temp 98.5°F | Resp 18

## 2021-10-21 DIAGNOSIS — W108XXA Fall (on) (from) other stairs and steps, initial encounter: Secondary | ICD-10-CM

## 2021-10-21 DIAGNOSIS — S99922A Unspecified injury of left foot, initial encounter: Secondary | ICD-10-CM

## 2021-10-21 DIAGNOSIS — S93402A Sprain of unspecified ligament of left ankle, initial encounter: Secondary | ICD-10-CM | POA: Diagnosis not present

## 2021-10-21 DIAGNOSIS — M25572 Pain in left ankle and joints of left foot: Secondary | ICD-10-CM | POA: Diagnosis not present

## 2021-10-21 DIAGNOSIS — M7732 Calcaneal spur, left foot: Secondary | ICD-10-CM | POA: Diagnosis not present

## 2021-10-21 MED ORDER — PREDNISONE 20 MG PO TABS
20.0000 mg | ORAL_TABLET | Freq: Every day | ORAL | 0 refills | Status: AC
Start: 2021-10-21 — End: 2021-10-26

## 2021-10-21 NOTE — Discharge Instructions (Addendum)
X-ray images negative for fracture involving the ankle or foot. Suspect ankle sprain given the mechanism of injury and location of injury. Continue to apply a compressive Ankle brace. Rest and elevate the affected painful area.  Apply cold compresses intermittently as needed.  Complete entire course of prednisone. These injuries typically can take up to 14 days to resolve as. As pain recedes, begin normal activities slowly as tolerated.  Follow-up with [facility] if symptoms worsen or do not resolve.

## 2021-10-21 NOTE — ED Triage Notes (Signed)
Pt reports she fell down the last few steps last week.Pt continues to have pain to Lt fool.

## 2021-10-21 NOTE — ED Provider Notes (Addendum)
[facility] Meagan Conway    CSN: [id number] Arrival date & time: 10/21/21  3474      History   Chief Complaint Chief Complaint  Patient presents with   Foot Injury    Entered by patient   Fall    HPI Meagan Conway is a 61 y.o. female.   HPI Patient presents today with left foot and ankle pain.  She reports while on vacation a few days ago she tripped and fell down approximately 3 stairs.  She thinks she twisted her ankle and possibly even felt with her foot and ankle tucked under her body weight.  She has had continued swelling and pain involving with ROM ankle and lateral aspect of foot. She has performed RICE and taking tylenol.  Patient is intolerant of any NSAIDs. Past Medical History:  Diagnosis Date   Anemia    Depression    with anxious features   GERD (gastroesophageal reflux disease)    Hypertension 05/2005   H/O. no meds currently   IDA (iron deficiency anemia) 10/03/2018   Obesity    Vertigo    last episode 03/18    Patient Active Problem List   Diagnosis Date Noted   Rectovaginal fistula    Change in bowel habits    Current use of proton pump inhibitor 08/25/2020   Fatigue 08/25/2020   IDA (iron deficiency anemia) 10/03/2018   Special screening for malignant neoplasms, colon    Ulceration of intestine    Iron deficiency anemia 12/29/2014   Colon cancer screening 07/08/2014   Prediabetes 07/04/2013   Other abnormal glucose 07/04/2013   Routine general medical examination at a health care facility 06/04/2011   GERD (gastroesophageal reflux disease) 03/10/2011   Morbid obesity ([place]) 03/10/2011   COLONIC POLYPS 04/22/2010   ANXIETY DEPRESSION 02/28/2008   Hypertension 01/15/2008    Past Surgical History:  Procedure Laterality Date   COLONOSCOPY N/A 09/18/2021   Procedure: COLONOSCOPY;  Surgeon: Lucilla Lame, MD;  Location: [place];  Service: Endoscopy;  Laterality: N/A;   COLONOSCOPY WITH PROPOFOL N/A 08/27/2016   Procedure: COLONOSCOPY  WITH PROPOFOL;  Surgeon: Lucilla Lame, MD;  Location: [place];  Service: Endoscopy;  Laterality: N/A;   ENDOMETRIAL ABLATION W/ NOVASURE     ESOPHAGOGASTRODUODENOSCOPY (EGD) WITH PROPOFOL N/A 08/29/2017   Procedure: ESOPHAGOGASTRODUODENOSCOPY (EGD) WITH PROPOFOL;  Surgeon: Lucilla Lame, MD;  Location: [place];  Service: Endoscopy;  Laterality: N/A;  pt request early arrival time   GIVENS CAPSULE STUDY N/A 09/28/2017   Procedure: GIVENS CAPSULE STUDY;  Surgeon: Lucilla Lame, MD;  Location: [facility] ENDOSCOPY;  Service: Endoscopy;  Laterality: N/A;   OVARIAN CYST SURGERY  08/14/2002   rectovaginal fistula repair      OB History     Gravida  2   Para  2   Term  2   Preterm      AB      Living  2      SAB      IAB      Ectopic      Multiple      Live Births  2            Home Medications    Prior to Admission medications   Medication Sig Start Date End Date Taking? Authorizing Provider  predniSONE (DELTASONE) 20 MG tablet Take 1 tablet (20 mg total) by mouth daily with breakfast for 5 days. 10/21/21 10/26/21 Yes Scot Jun, FNP  Cholecalciferol (D3 PO)  Take by mouth.    [provider]  diazepam (VALIUM) 5 MG tablet Take 1 tablet 30 mins prior to MRI 10/05/21   Lin Landsman, MD  FLUoxetine (PROZAC) 20 MG capsule Take 1 capsule (20 mg total) by mouth daily. 10/06/21   Tower, Wynelle Fanny, MD  lisinopril (ZESTRIL) 10 MG tablet Take 1 tablet (10 mg total) by mouth daily. 08/26/21   Tower, Wynelle Fanny, MD    Family History Family History  Problem Relation Age of Onset   Hypertension Mother        died of GI bleed-unexpected    Hypertension Father    Heart disease Father 19       MI   Alcohol abuse Father    Heart disease Maternal Grandmother    Heart disease Paternal Grandmother     Social History Social History   Tobacco Use   Smoking status: Former    Types: Cigarettes    Quit date: 2014    Years since quitting: 9.6    Smokeless tobacco: Never   Tobacco comments:    quit many years ago.  Vaping Use   Vaping Use: Never used  Substance Use Topics   Alcohol use: Yes    Alcohol/week: 5.0 standard drinks of alcohol    Types: 2 Glasses of wine, 3 Cans of beer per week    Comment: occ   Drug use: No     Allergies   Other   Review of Systems Review of Systems Pertinent negatives listed in HPI   Physical Exam Triage Vital Signs ED Triage Vitals  Enc Vitals Group     BP 10/21/21 0834 127/85     Pulse Rate 10/21/21 0834 64     Resp 10/21/21 0834 18     Temp 10/21/21 0834 98.5 F (36.9 C)     Temp Source 10/21/21 0834 Oral     SpO2 10/21/21 0834 95 %     Weight --      Height --      Head Circumference --      Peak Flow --      Pain Score 10/21/21 0838 6     Pain Loc --      Pain Edu? --      Excl. in [place]? --    No data found.  Updated Vital Signs BP 127/85 (BP Location: Left Arm)   Pulse 64   Temp 98.5 F (36.9 C) (Oral)   Resp 18   SpO2 95%   Visual Acuity Right Eye Distance:   Left Eye Distance:   Bilateral Distance:    Right Eye Near:   Left Eye Near:    Bilateral Near:     Physical Exam Vitals reviewed.  Constitutional:      Appearance: Normal appearance.  Eyes:     Extraocular Movements: Extraocular movements intact.     Pupils: Pupils are equal, round, and reactive to light.  Cardiovascular:     Rate and Rhythm: Normal rate and regular rhythm.  Pulmonary:     Effort: Pulmonary effort is normal.     Breath sounds: Normal breath sounds.  Musculoskeletal:     Cervical back: Normal range of motion and neck supple.     Left ankle: Swelling present. Tenderness present over the lateral malleolus and medial malleolus.     Left foot: Decreased range of motion. Swelling present.       Feet:  Neurological:     General: No focal deficit  present.     Mental Status: She is alert and oriented to person, place, and time.  Psychiatric:        Mood and Affect: Mood normal.         Behavior: Behavior normal.        Thought Content: Thought content normal.        Judgment: Judgment normal.      UC Treatments / Results  Labs (all labs ordered are listed, but only abnormal results are displayed) Labs Reviewed - No data to display  EKG   Radiology DG Ankle Complete Left  Result Date: 10/21/2021 CLINICAL DATA:  61 year old female status post fall down stairs 3 days ago. Pain. EXAM: LEFT ANKLE COMPLETE - 3+ VIEW COMPARISON:  Left foot series today, 01/26/2016. FINDINGS: Preserved mortise joint alignment. No joint effusion identified. Talar dome intact. Bone mineralization is within normal limits. Distal tibia and fibula intact. Calcaneus appears intact with increased degenerative spurring since 2017. No acute osseous abnormality identified. Possible acute soft tissue swelling at the ankle. IMPRESSION: No acute fracture or dislocation identified about the left ankle. Electronically Signed   By: Genevie Ann M.D.   On: 10/21/2021 09:12   DG Foot Complete Left  Result Date: 10/21/2021 CLINICAL DATA:  61 year old female status post fall down stairs 3 days ago. Pain. EXAM: LEFT FOOT - COMPLETE 3+ VIEW COMPARISON:  Left foot series 01/26/2016. FINDINGS: Bone mineralization is within normal limits. There is no evidence of fracture or dislocation. Calcaneus plantar degenerative spurring has progressed since 2017. No other evidence of arthropathy or other focal bone abnormality. Soft tissues are unremarkable. IMPRESSION: No acute fracture or dislocation identified about the left foot. Electronically Signed   By: Genevie Ann M.D.   On: 10/21/2021 09:11    Procedures Procedures (including critical care time)  Medications Ordered in UC Medications - No data to display  Initial Impression / Assessment and Plan / UC Course  I have reviewed the triage vital signs and the nursing notes.  Pertinent labs & imaging results that were available during my care of the patient were reviewed by  me and considered in my medical decision making (see chart for details).    Imaging of the left foot and left ankle negative.  Based on mechanism of injury and location of injury suspect a left ankle sprain. Treatment per discharge medication orders and instructions. Orthopedics follow-up with symptoms worsen or do not improve within 2 weeks. Final Clinical Impressions(s) / UC Diagnoses   Final diagnoses:  Fall (on) (from) other stairs and steps, initial encounter  Foot injury, left, initial encounter  Sprain of left ankle, unspecified ligament, initial encounter     Discharge Instructions      X-ray images negative for fracture involving the ankle or foot. Suspect ankle sprain given the mechanism of injury and location of injury. Continue to apply a compressive Ankle brace. Rest and elevate the affected painful area.  Apply cold compresses intermittently as needed.  Complete entire course of prednisone. These injuries typically can take up to 14 days to resolve as. As pain recedes, begin normal activities slowly as tolerated.  Follow-up with [facility] if symptoms worsen or do not resolve.     ED Prescriptions     Medication Sig Dispense Auth. Provider   predniSONE (DELTASONE) 20 MG tablet Take 1 tablet (20 mg total) by mouth daily with breakfast for 5 days. 5 tablet Scot Jun, FNP      PDMP not reviewed  this encounter.   Scot Jun, FNP 10/21/21 [place], [place], [place] 10/21/21 [phone]

## 2021-10-22 ENCOUNTER — Telehealth: Payer: Self-pay

## 2021-10-22 DIAGNOSIS — N823 Fistula of vagina to large intestine: Secondary | ICD-10-CM

## 2021-10-22 NOTE — Telephone Encounter (Signed)
Made appointment for patient

## 2021-10-22 NOTE — Telephone Encounter (Signed)
This patient has new diagnosis of chrohns disease and I scheduled her to see you on 12/01/2021. Do you want her to have any blood work before appointment. She was a previously a Therapist, sports patient

## 2021-10-22 NOTE — Telephone Encounter (Signed)
Recommend GI profile PCR and fecal calprotectin levels Recommend QuantiFERON gold, hepatitis B surface antigen, antibody, core antibody, hepatitis A antibody, HCV antibody  RV

## 2021-10-22 NOTE — Addendum Note (Signed)
Addended by: Ulyess Blossom L on: 10/22/2021 04:42 PM   Modules accepted: Orders

## 2021-10-22 NOTE — Telephone Encounter (Signed)
Informed patient and this information she will go for labs the end of this month

## 2021-10-22 NOTE — Telephone Encounter (Signed)
-----   Message from [place] sent at 10/22/2021 10:22 AM EDT ----- Can you call to schedule pt for Crohn's with Dr Marius Ditch, pt is aware per [facility]

## 2021-11-03 ENCOUNTER — Other Ambulatory Visit: Payer: Self-pay | Admitting: Family Medicine

## 2021-11-11 ENCOUNTER — Inpatient Hospital Stay: Payer: BC Managed Care – PPO | Attending: Oncology

## 2021-11-11 DIAGNOSIS — D500 Iron deficiency anemia secondary to blood loss (chronic): Secondary | ICD-10-CM | POA: Diagnosis not present

## 2021-11-11 DIAGNOSIS — Q2730 Arteriovenous malformation, site unspecified: Secondary | ICD-10-CM | POA: Insufficient documentation

## 2021-11-11 LAB — CBC WITH DIFFERENTIAL/PLATELET
Abs Immature Granulocytes: 0.03 10*3/uL (ref 0.00–0.07)
Basophils Absolute: 0.1 10*3/uL (ref 0.0–0.1)
Basophils Relative: 1 %
Eosinophils Absolute: 0.2 10*3/uL (ref 0.0–0.5)
Eosinophils Relative: 2 %
HCT: 41 % (ref 36.0–46.0)
Hemoglobin: 13.4 g/dL (ref 12.0–15.0)
Immature Granulocytes: 0 %
Lymphocytes Relative: 24 %
Lymphs Abs: 2.5 10*3/uL (ref 0.7–4.0)
MCH: 29.3 pg (ref 26.0–34.0)
MCHC: 32.7 g/dL (ref 30.0–36.0)
MCV: 89.7 fL (ref 80.0–100.0)
Monocytes Absolute: 0.5 10*3/uL (ref 0.1–1.0)
Monocytes Relative: 5 %
Neutro Abs: 7 10*3/uL (ref 1.7–7.7)
Neutrophils Relative %: 68 %
Platelets: 321 10*3/uL (ref 150–400)
RBC: 4.57 MIL/uL (ref 3.87–5.11)
RDW: 15.5 % (ref 11.5–15.5)
WBC: 10.3 10*3/uL (ref 4.0–10.5)
nRBC: 0 % (ref 0.0–0.2)

## 2021-11-11 LAB — IRON AND TIBC
Iron: 88 ug/dL (ref 28–170)
Saturation Ratios: 19 % (ref 10.4–31.8)
TIBC: 463 ug/dL — ABNORMAL HIGH (ref 250–450)
UIBC: 375 ug/dL

## 2021-11-11 LAB — FERRITIN: Ferritin: 13 ng/mL (ref 11–307)

## 2021-11-13 ENCOUNTER — Encounter: Payer: Self-pay | Admitting: Oncology

## 2021-11-13 ENCOUNTER — Inpatient Hospital Stay: Payer: BC Managed Care – PPO | Attending: Oncology | Admitting: Oncology

## 2021-11-13 DIAGNOSIS — K5520 Angiodysplasia of colon without hemorrhage: Secondary | ICD-10-CM | POA: Insufficient documentation

## 2021-11-13 DIAGNOSIS — E611 Iron deficiency: Secondary | ICD-10-CM | POA: Insufficient documentation

## 2021-11-13 DIAGNOSIS — Q2730 Arteriovenous malformation, site unspecified: Secondary | ICD-10-CM | POA: Diagnosis not present

## 2021-11-13 DIAGNOSIS — Z87891 Personal history of nicotine dependence: Secondary | ICD-10-CM | POA: Insufficient documentation

## 2021-11-13 MED ORDER — VITRON-C 65-125 MG PO TABS: 1.0000 | ORAL_TABLET | Freq: Every day | ORAL | 0 refills | Status: DC

## 2021-11-13 NOTE — Progress Notes (Signed)
HEMATOLOGY-ONCOLOGY TeleHEALTH VISIT PROGRESS NOTE  I connected with Meagan Conway on 11/13/21  at 8:50AM EDT by video enabled telemedicine visit and verified that I am speaking with the correct person using two identifiers. I discussed the limitations, risks, security and privacy concerns of performing an evaluation and management service by telemedicine and the availability of in-person appointments. The patient expressed understanding and agreed to proceed.   Other persons participating in the visit and their role in the encounter:  None  Patient's location: Vehicle, not driving Provider's location: office Chief Complaint: Iron deficiency anemia   INTERVAL HISTORY Meagan Conway is a 61 y.o. female who has above history reviewed by me today presents for follow up visit for iron deficiency anemia She reports feeling well.  Mild fatigue.  No dark stool or blood in the stool.  No other new complaints.  Review of Systems  Constitutional:  Positive for fatigue. Negative for appetite change, chills and fever.  HENT:   Negative for hearing loss and voice change.   Eyes:  Negative for eye problems.  Respiratory:  Negative for chest tightness and cough.   Cardiovascular:  Negative for chest pain.  Gastrointestinal:  Negative for abdominal distention, abdominal pain and blood in stool.  Endocrine: Negative for hot flashes.  Genitourinary:  Negative for difficulty urinating and frequency.   Musculoskeletal:  Negative for arthralgias.  Skin:  Negative for itching and rash.  Neurological:  Negative for extremity weakness.  Hematological:  Negative for adenopathy.  Psychiatric/Behavioral:  Negative for confusion.     Past Medical History:  Diagnosis Date   Anemia    Depression    with anxious features   GERD (gastroesophageal reflux disease)    Hypertension 05/2005   H/O. no meds currently   IDA (iron deficiency anemia) 10/03/2018   Obesity    Vertigo    last episode 03/18   Past  Surgical History:  Procedure Laterality Date   COLONOSCOPY N/A 09/18/2021   Procedure: COLONOSCOPY;  Surgeon: Lucilla Lame, MD;  Location: [place];  Service: Endoscopy;  Laterality: N/A;   COLONOSCOPY WITH PROPOFOL N/A 08/27/2016   Procedure: COLONOSCOPY WITH PROPOFOL;  Surgeon: Lucilla Lame, MD;  Location: [place];  Service: Endoscopy;  Laterality: N/A;   ENDOMETRIAL ABLATION W/ NOVASURE     ESOPHAGOGASTRODUODENOSCOPY (EGD) WITH PROPOFOL N/A 08/29/2017   Procedure: ESOPHAGOGASTRODUODENOSCOPY (EGD) WITH PROPOFOL;  Surgeon: Lucilla Lame, MD;  Location: [place];  Service: Endoscopy;  Laterality: N/A;  pt request early arrival time   GIVENS CAPSULE STUDY N/A 09/28/2017   Procedure: GIVENS CAPSULE STUDY;  Surgeon: Lucilla Lame, MD;  Location[facility] ENDOSCOPY;  Service: Endoscopy;  Laterality: N/A;   OVARIAN CYST SURGERY  08/14/2002   rectovaginal fistula repair      Family History  Problem Relation Age of Onset   Hypertension Mother        died of GI bleed-unexpected    Hypertension Father    Heart disease Father 29       MI   Alcohol abuse Father    Heart disease Maternal Grandmother    Heart disease Paternal Grandmother     Social History   Socioeconomic History   Marital status: Married    Spouse name: Not on file   Number of children: Not on file   Years of education: Not on file   Highest education level: Not on file  Occupational History   Not on file  Tobacco Use   Smoking status:  Former    Types: Cigarettes    Quit date: 2014    Years since quitting: 9.6   Smokeless tobacco: Never   Tobacco comments:    quit many years ago.  Vaping Use   Vaping Use: Never used  Substance and Sexual Activity   Alcohol use: Yes    Alcohol/week: 5.0 standard drinks of alcohol    Types: 2 Glasses of wine, 3 Cans of beer per week    Comment: occ   Drug use: No   Sexual activity: Yes    Partners: Male    Birth control/protection: None    Comment:  husband has a vasectomy  Other Topics Concern   Not on file  Social History Narrative   Not on file   Social Determinants of Health   Financial Resource Strain: Not on file  Food Insecurity: Not on file  Transportation Needs: Not on file  Physical Activity: Not on file  Stress: Not on file  Social Connections: Not on file  Intimate Partner Violence: Not on file    Current Outpatient Medications on File Prior to Visit  Medication Sig Dispense Refill   Cholecalciferol (D3 PO) Take by mouth.     FLUoxetine (PROZAC) 20 MG capsule Take 1 capsule (20 mg total) by mouth daily. 90 capsule 3   lisinopril (ZESTRIL) 10 MG tablet Take 1 tablet (10 mg total) by mouth daily. 90 tablet 3   No current facility-administered medications on file prior to visit.    Allergies  Allergen Reactions   Other Other (See Comments)    PT reports she does not take Nisaids        Observations/Objective: There were no vitals filed for this visit. There is no height or weight on file to calculate BMI.  Physical Exam Neurological:     Mental Status: She is alert.     CBC    Component Value Date/Time   WBC 10.3 11/11/2021 1133   RBC 4.57 11/11/2021 1133   HGB 13.4 11/11/2021 1133   HCT 41.0 11/11/2021 1133   PLT 321 11/11/2021 1133   MCV 89.7 11/11/2021 1133   MCH 29.3 11/11/2021 1133   MCHC 32.7 11/11/2021 1133   RDW 15.5 11/11/2021 1133   LYMPHSABS 2.5 11/11/2021 1133   MONOABS 0.5 11/11/2021 1133   EOSABS 0.2 11/11/2021 1133   BASOSABS 0.1 11/11/2021 1133    CMP     Component Value Date/Time   NA 143 08/19/2021 0727   K 4.8 08/19/2021 0727   CL 106 08/19/2021 0727   CO2 28 08/19/2021 0727   GLUCOSE 107 (H) 08/19/2021 0727   BUN 14 08/19/2021 0727   CREATININE 0.88 08/19/2021 0727   CREATININE 0.96 08/17/2019 1537   CALCIUM 9.2 08/19/2021 0727   PROT 6.2 08/19/2021 0727   ALBUMIN 4.1 08/19/2021 0727   AST 14 08/19/2021 0727   ALT 17 08/19/2021 0727   ALKPHOS 78 08/19/2021 0727    BILITOT 0.3 08/19/2021 0727   GFRNONAA >60 06/09/2016 0446   GFRAA >60 06/09/2016 0446     Assessment and Plan: 1. Iron deficiency   2. AVM (arteriovenous malformation)    Iron deficiency with history of AVM Labs are reviewed and discussed with patient. Hemoglobin remained stable and normal. Iron panel shows increase of TIBC, borderline low ferritin.  Consistent with early iron deficiency. Recommend patient to resume oral iron supplementation, prescription of Vitron C sent to pharmacy. Recommend her repeat cbc and iron panel in 4 months.  Follow Up Instructions: Follow up in 1 year, or earlier if needed.   I discussed the assessment and treatment plan with the patient. The patient was provided an opportunity to ask questions and all were answered. The patient agreed with the plan and demonstrated an understanding of the instructions.  The patient was advised to call back or seek an in-person evaluation if the symptoms worsen or if the condition fails to improve as anticipated.   Earlie Server, MD 11/13/2021 8:52 AM

## 2021-11-24 ENCOUNTER — Encounter: Payer: Self-pay | Admitting: Gastroenterology

## 2021-11-25 DIAGNOSIS — N823 Fistula of vagina to large intestine: Secondary | ICD-10-CM | POA: Diagnosis not present

## 2021-11-26 ENCOUNTER — Telehealth: Payer: Self-pay

## 2021-11-26 DIAGNOSIS — N823 Fistula of vagina to large intestine: Secondary | ICD-10-CM | POA: Diagnosis not present

## 2021-11-26 LAB — HEPATITIS B SURFACE ANTIGEN: Hepatitis B Surface Ag: NEGATIVE

## 2021-11-26 NOTE — Telephone Encounter (Signed)
-----   Message from Lin Landsman, MD sent at 11/26/2021  9:32 AM EDT ----- Please remind her about stool studies we ordered, would like to see before her appt if she can  RV

## 2021-11-26 NOTE — Telephone Encounter (Signed)
Patient states she is going to return the stool sample this morning

## 2021-11-28 LAB — HEPATITIS B SURFACE ANTIBODY,QUALITATIVE: Hep B Surface Ab, Qual: NONREACTIVE

## 2021-11-28 LAB — QUANTIFERON-TB GOLD PLUS
QuantiFERON Mitogen Value: >10 IU/mL
QuantiFERON Nil Value: 0.04 IU/mL
QuantiFERON TB1 Ag Value: 0.04 IU/mL
QuantiFERON TB2 Ag Value: 0.03 IU/mL
QuantiFERON-TB Gold Plus: NEGATIVE

## 2021-11-28 LAB — HEPATITIS C ANTIBODY: Hep C Virus Ab: NONREACTIVE

## 2021-11-28 LAB — HEPATITIS B SURFACE ANTIGEN: Hepatitis B Surface Ag: NEGATIVE

## 2021-11-28 LAB — HEPATITIS B CORE ANTIBODY, TOTAL: Hep B Core Total Ab: NEGATIVE

## 2021-11-28 LAB — HEPATITIS A ANTIBODY, TOTAL: hep A Total Ab: POSITIVE — AB

## 2021-11-30 ENCOUNTER — Telehealth: Payer: Self-pay

## 2021-11-30 LAB — GI PROFILE, STOOL, PCR

## 2021-11-30 LAB — CALPROTECTIN, FECAL: Calprotectin, Fecal: 60 ug/g (ref 0–120)

## 2021-11-30 NOTE — Telephone Encounter (Signed)
-----   Message from Lin Landsman, MD sent at 11/30/2021 10:43 AM EDT ----- Not sure why her GI profile PCR is canceled.  Could you please find out and have the patient submit stool sample again  Thanks RV

## 2021-11-30 NOTE — Telephone Encounter (Signed)
The stool sample was over filled. Called patient and she will pick up stool kit tomorrow at appointment

## 2021-12-01 ENCOUNTER — Encounter: Payer: Self-pay | Admitting: Gastroenterology

## 2021-12-01 ENCOUNTER — Ambulatory Visit (INDEPENDENT_AMBULATORY_CARE_PROVIDER_SITE_OTHER): Payer: BC Managed Care – PPO | Admitting: Gastroenterology

## 2021-12-01 ENCOUNTER — Other Ambulatory Visit: Payer: Self-pay

## 2021-12-01 VITALS — BP 145/92 | HR 66 | Ht 65.0 in | Wt 230.6 lb

## 2021-12-01 DIAGNOSIS — Z862 Personal history of diseases of the blood and blood-forming organs and certain disorders involving the immune mechanism: Secondary | ICD-10-CM | POA: Diagnosis not present

## 2021-12-01 DIAGNOSIS — K639 Disease of intestine, unspecified: Secondary | ICD-10-CM

## 2021-12-01 DIAGNOSIS — R197 Diarrhea, unspecified: Secondary | ICD-10-CM

## 2021-12-01 DIAGNOSIS — T39395A Adverse effect of other nonsteroidal anti-inflammatory drugs [NSAID], initial encounter: Secondary | ICD-10-CM

## 2021-12-01 DIAGNOSIS — K449 Diaphragmatic hernia without obstruction or gangrene: Secondary | ICD-10-CM | POA: Diagnosis not present

## 2021-12-01 DIAGNOSIS — K219 Gastro-esophageal reflux disease without esophagitis: Secondary | ICD-10-CM | POA: Diagnosis not present

## 2021-12-01 MED ORDER — PANTOPRAZOLE SODIUM 20 MG PO TBEC: 20.0000 mg | DELAYED_RELEASE_TABLET | Freq: Every day | ORAL | 3 refills | Status: DC

## 2021-12-01 NOTE — Progress Notes (Signed)
Cephas Darby, MD [address]  [place]  [place], [place] [address]  Main: [phone]  Fax: [phone]    Gastroenterology Consultation  Referring Provider:     Abner Greenspan, MD Primary Care Physician:  Tower, Wynelle Fanny, MD Primary Gastroenterologist:  Dr. Cephas Darby Reason for Consultation: Iron deficiency anemia, acute ileitis and colitis        HPI:   Meagan Conway is a 61 y.o. female referred by Dr. Glori Bickers, Wynelle Fanny, MD  for consultation & management of chronic iron deficiency anemia, acute ileitis and colitis.  Patient has history of long-term NSAID use, primarily [facility] powders/Goody powder and other over-the-counter nonsteroidal anti-inflammatory medications for chronic back pain.  She has history of chronic intermittent diarrhea associated with iron deficiency anemia.  She had colonoscopy from 2018 which showed scattered ulcers in the colon, pathology revealed focal active colitis only.  There was no evidence of CMV.  There was no evidence of chronicity.  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  She developed this fistula during the delivery of her child more than 20 years ago.  She underwent repair which failed.  Since then, she has been experiencing leakage of stools per vaginal.  Patient is currently followed by Dr. Wannetta Sender, urogynecologist for her rectovaginal fistula.  The plan is for her to undergo repair based on the MRI findings.  She underwent MR enterography abdomen and pelvis on 10/06/2021, there was no clear visualization of rectovaginal fistula.  She does have large hiatal hernia with complete intrathoracic position of the stomach, cholelithiasis and mild mucosal hyperenhancement of the terminal ileum without evidence of significant wall thickening, stricture, fistula or abscess.  Patient was seen by Dr. Allen Norris on 09/01/2021, subsequently underwent  colonoscopy on 09/18/2021 which revealed few aphthous ulcers in the terminal ileum, cecum as well as transverse colon.  The left colon and rectum were normal other than sigmoid diverticulosis and internal hemorrhoids.  The biopsies revealed acute ileitis and acute colitis in the right colon.  There was no evidence of chronicity.  Patient's fecal calprotectin levels were also unremarkable.  Patient was recommended by Dr. Allen Norris to stop all NSAIDs use based on the colonoscopy findings.  Patient reports that she has not been taking any nonsteroidal anti-inflammatory medications since then.  Patient is accompanied by her husband today.  She does acknowledge having chronic GERD symptoms as well, she does have large hiatal hernia based on MR enterography.  She is on long-term PPI use.  Patient's anemia has resolved, still has mild iron deficiency, taking iron supplements.  Normal B12 levels  NSAIDs: History of long-term NSAID use, stopped since colonoscopy in early July 2023.  Antiplts/Anticoagulants/Anti thrombotics: None  GI Procedures:  Upper endoscopy 08/29/2017  Colonoscopy 08/27/2016  Colonoscopy 09/18/2021 - A few ulcers in the terminal ileum. Biopsied. - Multiple ulcers in the transverse colon and in the cecum. Biopsied. - Diverticulosis in the sigmoid colon. - Non-bleeding internal hemorrhoids. DIAGNOSIS:  A.  TERMINAL ILEUM; BIOPSIES:  - MODERATE ACUTE ILEITIS (SEE COMMENT).  - NO SIGNIFICANT ATYPIA.   B.  COLON, CLINICALLY ULCERS; BIOPSIES:  - FOCAL MUCOSAL ULCERATION WITH ADHERENT FIBRINOPURLULENT DEBRIS.  - UN-ULCERATED FRAGMENTS WITH MILD ACUTE COLITIS (SEE COMMENT).  - NO SIGNIFICANT ATYPIA OR MALIGNANCY.   C.  COLON; RANDOM BIOPSIES:  - FRAGMENT OF ULCERATED GRANULATION TISSUE.  - MULTIPLE ADDITIONAL FRAGMENTS OF COLONIC MUCOSA WITH  NO SPECIFIC  PATHOLOGIC ABNORMALITY.  - NO SIGNIFICANT ATYPIA OR MALIGNANCY.  Past Medical History:  Diagnosis Date   Anemia    Depression    with  anxious features   GERD (gastroesophageal reflux disease)    Hypertension 05/2005   H/O. no meds currently   IDA (iron deficiency anemia) 10/03/2018   Obesity    Vertigo    last episode 03/18    Past Surgical History:  Procedure Laterality Date   COLONOSCOPY N/A 09/18/2021   Procedure: COLONOSCOPY;  Surgeon: Lucilla Lame, MD;  Location: [place];  Service: Endoscopy;  Laterality: N/A;   COLONOSCOPY WITH PROPOFOL N/A 08/27/2016   Procedure: COLONOSCOPY WITH PROPOFOL;  Surgeon: Lucilla Lame, MD;  Location: [place];  Service: Endoscopy;  Laterality: N/A;   ENDOMETRIAL ABLATION W/ NOVASURE     ESOPHAGOGASTRODUODENOSCOPY (EGD) WITH PROPOFOL N/A 08/29/2017   Procedure: ESOPHAGOGASTRODUODENOSCOPY (EGD) WITH PROPOFOL;  Surgeon: Lucilla Lame, MD;  Location: [place];  Service: Endoscopy;  Laterality: N/A;  pt request early arrival time   GIVENS CAPSULE STUDY N/A 09/28/2017   Procedure: GIVENS CAPSULE STUDY;  Surgeon: Lucilla Lame, MD;  [facility] END[facility]COPY;  Service: Endoscopy;  Laterality: N/A;   OVARIAN CYST SURGERY  08/14/2002   rectovaginal fistula repair       Current Outpatient Medications:    Cholecalciferol (D3 PO), Take by mouth., Disp: , Rfl:    cyclobenzaprine (FLEXERIL) 10 MG tablet, Take 10 mg by mouth 3 (three) times daily., Disp: , Rfl:    FLUoxetine (PROZAC) 20 MG capsule, Take 1 capsule (20 mg total) by mouth daily., Disp: 90 capsule, Rfl: 3   Iron-Vitamin C (VITRON-C) 65-125 MG TABS, Take 1 tablet by mouth daily., Disp: 120 tablet, Rfl: 0   lisinopril (ZESTRIL) 10 MG tablet, Take 1 tablet (10 mg total) by mouth daily., Disp: 90 tablet, Rfl: 3   pantoprazole (PROTONIX) 40 MG tablet, Take 1 tablet (40 mg total) by mouth 2 (two) times daily before a meal. 30 minutes before breakfast, Disp: 180 tablet, Rfl: 2   Family History  Problem Relation Age of Onset   Hypertension Mother        died of GI bleed-unexpected    Hypertension Father     Heart disease Father 28       MI   Alcohol abuse Father    Heart disease Maternal Grandmother    Heart disease Paternal Grandmother      Social History   Tobacco Use   Smoking status: Former    Types: Cigarettes    Quit date: 2014    Years since quitting: 9.7   Smokeless tobacco: Never   Tobacco comments:    quit many years ago.  Vaping Use   Vaping Use: Never used  Substance Use Topics   Alcohol use: Yes    Alcohol/week: 5.0 standard drinks of alcohol    Types: 2 Glasses of wine, 3 Cans of beer per week    Comment: occ   Drug use: No    Allergies as of 12/01/2021 - Review Complete 12/01/2021  Allergen Reaction Noted   Nsaids  11/16/2021    Review of Systems:    All systems reviewed and negative except where noted in HPI.   Physical Exam:  BP (!) 145/92 (BP Location: Left Arm, Patient Position: Sitting, Cuff Size: Large)   Pulse 66   Ht '5\' 5"'$  (1.651 m)   Wt 230 lb 9.6 oz (104.6 kg)   BMI 38.37 kg/m  No LMP recorded. Patient has had an ablation.  General:   Alert,  Well-developed, well-nourished, pleasant and cooperative in NAD Head:  Normocephalic and atraumatic. Eyes:  Sclera clear, no icterus.   Conjunctiva pink. Ears:  Normal auditory acuity. Nose:  No deformity, discharge, or lesions. Mouth:  No deformity or lesions,oropharynx pink & moist. Neck:  Supple; no masses or thyromegaly. Lungs:  Respirations even and unlabored.  Clear throughout to auscultation.   No wheezes, crackles, or rhonchi. No acute distress. Heart:  Regular rate and rhythm; no murmurs, clicks, rubs, or gallops. Abdomen:  Normal bowel sounds. Soft, non-tender and non-distended without masses, hepatosplenomegaly or hernias noted.  No guarding or rebound tenderness.   Rectal: Not performed Msk:  Symmetrical without gross deformities. Good, equal movement & strength bilaterally. Pulses:  Normal pulses noted. Extremities:  No clubbing or edema.  No cyanosis. Neurologic:  Alert and oriented  x3;  grossly normal neurologically. Skin:  Intact without significant lesions or rashes. No jaundice. Psych:  Alert and cooperative. Normal mood and affect.  Imaging Studies: Reviewed  Assessment and Plan:   Meagan Conway is a 61 y.o. is not Caucasian female with obesity, BMI 38, chronic iron deficiency anemia, chronic GERD, large hiatal hernia, history of chronic NSAID use, chronic nonhealing rectovaginal fistula is seen in consultation for acute ileitis and colitis  Acute ileitis and colitis: After reviewing the colonoscopy findings including pathology results from previous colonoscopy, endoscopy and most recent colonoscopy from 09/2021, there are no features to confirm inflammatory bowel disease.  She does have active ileitis and colitis with no features of chronicity.  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  No fecal calprotectin levels Recommend GI profile PCR to rule out any infection Strongly advised patient to completely limit NSAID use to allow healing of enteropathy Imodium 1 to 2 pills daily to control diarrhea  Rectovaginal fistula No evidence of inflammatory bowel disease Recommend to proceed with fistula repair by her urogynecologist  Chronic iron deficiency anemia Likely chronic blood loss from long-term NSAID use and precipitated by large hiatal hernia Anemia has currently resolved, still has iron deficiency, currently on oral iron replacement  Chronic GERD and large hiatal hernia Recommend upper endoscopy with esophageal biopsies, gastric biopsies Patient will probably benefit from hiatal hernia repair Recommend Protonix 40 mg p.o. twice daily long-term until evaluated by surgery for hernia repair Reiterated on antireflux lifestyle, information provided   Follow up in 4 months   Cephas Darby, MD

## 2021-12-01 NOTE — Progress Notes (Unsigned)
Gi

## 2021-12-02 ENCOUNTER — Other Ambulatory Visit: Payer: Self-pay

## 2021-12-02 DIAGNOSIS — K219 Gastro-esophageal reflux disease without esophagitis: Secondary | ICD-10-CM

## 2021-12-02 MED ORDER — PANTOPRAZOLE SODIUM 40 MG PO TBEC: 40.0000 mg | DELAYED_RELEASE_TABLET | Freq: Two times a day (BID) | ORAL | 2 refills | Status: DC

## 2021-12-03 ENCOUNTER — Other Ambulatory Visit: Payer: Self-pay | Admitting: Gastroenterology

## 2021-12-03 ENCOUNTER — Encounter: Payer: Self-pay | Admitting: Obstetrics and Gynecology

## 2021-12-03 DIAGNOSIS — R197 Diarrhea, unspecified: Secondary | ICD-10-CM | POA: Diagnosis not present

## 2021-12-07 LAB — GI PROFILE, STOOL, PCR

## 2021-12-22 ENCOUNTER — Encounter: Payer: BC Managed Care – PPO | Admitting: Obstetrics and Gynecology

## 2021-12-25 ENCOUNTER — Encounter
Admission: RE | Disposition: A | Discharge status: Home / Self Care | Payer: Self-pay | Source: Home / Self Care | Attending: Gastroenterology

## 2021-12-25 ENCOUNTER — Ambulatory Visit: Payer: BC Managed Care – PPO | Admitting: Registered Nurse

## 2021-12-25 ENCOUNTER — Ambulatory Visit
Admission: RE | Admit: 2021-12-25 | Discharge: 2021-12-25 | Disposition: A | Discharge status: Home / Self Care | Payer: BC Managed Care – PPO | Attending: Gastroenterology | Admitting: Gastroenterology

## 2021-12-25 ENCOUNTER — Encounter: Payer: Self-pay | Admitting: Gastroenterology

## 2021-12-25 DIAGNOSIS — Z6838 Body mass index (BMI) 38.0-38.9, adult: Secondary | ICD-10-CM | POA: Insufficient documentation

## 2021-12-25 DIAGNOSIS — E669 Obesity, unspecified: Secondary | ICD-10-CM | POA: Diagnosis not present

## 2021-12-25 DIAGNOSIS — I10 Essential (primary) hypertension: Secondary | ICD-10-CM | POA: Diagnosis not present

## 2021-12-25 DIAGNOSIS — D649 Anemia, unspecified: Secondary | ICD-10-CM | POA: Diagnosis not present

## 2021-12-25 DIAGNOSIS — K219 Gastro-esophageal reflux disease without esophagitis: Secondary | ICD-10-CM | POA: Insufficient documentation

## 2021-12-25 DIAGNOSIS — Z87891 Personal history of nicotine dependence: Secondary | ICD-10-CM | POA: Insufficient documentation

## 2021-12-25 DIAGNOSIS — K449 Diaphragmatic hernia without obstruction or gangrene: Secondary | ICD-10-CM

## 2021-12-25 DIAGNOSIS — F32A Depression, unspecified: Secondary | ICD-10-CM | POA: Diagnosis not present

## 2021-12-25 HISTORY — PX: ESOPHAGOGASTRODUODENOSCOPY (EGD) WITH PROPOFOL: SHX5813

## 2021-12-25 SURGERY — ESOPHAGOGASTRODUODENOSCOPY (EGD) WITH PROPOFOL
Anesthesia: General

## 2021-12-25 MED ORDER — SODIUM CHLORIDE 0.9 % IV SOLN
INTRAVENOUS | Status: DC
  Administered 2021-12-25: 1000 mL via INTRAVENOUS

## 2021-12-25 MED ORDER — PROPOFOL 500 MG/50ML IV EMUL
INTRAVENOUS | Status: DC | PRN
  Administered 2021-12-25: 150 ug/kg/min via INTRAVENOUS

## 2021-12-25 MED ORDER — GLYCOPYRROLATE 0.2 MG/ML IJ SOLN
INTRAMUSCULAR | Status: DC | PRN
  Administered 2021-12-25: .1 mg via INTRAVENOUS

## 2021-12-25 MED ORDER — ONDANSETRON HCL 4 MG/2ML IJ SOLN
INTRAMUSCULAR | Status: DC | PRN
  Administered 2021-12-25: 4 mg via INTRAVENOUS

## 2021-12-25 MED ORDER — GLYCOPYRROLATE 0.2 MG/ML IJ SOLN
INTRAMUSCULAR | Status: AC
  Filled 2021-12-25: qty 1

## 2021-12-25 MED ORDER — LIDOCAINE HCL (CARDIAC) PF 100 MG/5ML IV SOSY
PREFILLED_SYRINGE | INTRAVENOUS | Status: DC | PRN
  Administered 2021-12-25: 100 mg via INTRAVENOUS

## 2021-12-25 MED ORDER — PROPOFOL 1000 MG/100ML IV EMUL
INTRAVENOUS | Status: AC
  Filled 2021-12-25: qty 200

## 2021-12-25 MED ORDER — ONDANSETRON HCL 4 MG/2ML IJ SOLN
INTRAMUSCULAR | Status: AC
  Filled 2021-12-25: qty 2

## 2021-12-25 MED ORDER — PROPOFOL 10 MG/ML IV BOLUS
INTRAVENOUS | Status: DC | PRN
  Administered 2021-12-25: 110 mg via INTRAVENOUS
  Administered 2021-12-25: 20 mg via INTRAVENOUS

## 2021-12-25 MED ORDER — DEXMEDETOMIDINE HCL 200 MCG/2ML IV SOLN
INTRAVENOUS | Status: DC | PRN
  Administered 2021-12-25: 12 ug via INTRAVENOUS

## 2021-12-25 NOTE — H&P (Signed)
Cephas Darby, MD [address]  [place]  [place], [place] [address]  Main: [phone]  Fax: [phone] Pager: [phone]  Primary Care Physician:  Tower, Wynelle Fanny, MD Primary Gastroenterologist:  Dr. Cephas Darby  Pre-Procedure History & Physical: HPI:  Meagan Conway is a 61 y.o. female is here for an endoscopy.   Past Medical History:  Diagnosis Date   Anemia    Depression    with anxious features   GERD (gastroesophageal reflux disease)    Hypertension 05/2005   H/O. no meds currently   IDA (iron deficiency anemia) 10/03/2018   Obesity    Vertigo    last episode 03/18    Past Surgical History:  Procedure Laterality Date   COLONOSCOPY N/A 09/18/2021   Procedure: COLONOSCOPY;  Surgeon: Lucilla Lame, MD;  Location: [place];  Service: Endoscopy;  Laterality: N/A;   COLONOSCOPY WITH PROPOFOL N/A 08/27/2016   Procedure: COLONOSCOPY WITH PROPOFOL;  Surgeon: Lucilla Lame, MD;  Location: [place];  Service: Endoscopy;  Laterality: N/A;   ENDOMETRIAL ABLATION W/ NOVASURE     ESOPHAGOGASTRODUODENOSCOPY (EGD) WITH PROPOFOL N/A 08/29/2017   Procedure: ESOPHAGOGASTRODUODENOSCOPY (EGD) WITH PROPOFOL;  Surgeon: Lucilla Lame, MD;  Location: [place];  Service: Endoscopy;  Laterality: N/A;  pt request early arrival time   GIVENS CAPSULE STUDY N/A 09/28/2017   Procedure: GIVENS CAPSULE STUDY;  Surgeon: Lucilla Lame, MD;  Location: [facility] ENDOSCOPY;  Service: Endoscopy;  Laterality: N/A;   OVARIAN CYST SURGERY  08/14/2002   rectovaginal fistula repair      Prior to Admission medications   Medication Sig Start Date End Date Taking? Authorizing Provider  Cholecalciferol (D3 PO) Take by mouth.   Yes [provider]  FLUoxetine (PROZAC) 20 MG capsule Take 1 capsule (20 mg total) by mouth daily. 10/06/21  Yes Tower, Wynelle Fanny, MD  Iron-Vitamin C (VITRON-C) 65-125 MG TABS Take 1 tablet by mouth daily. 11/13/21  Yes Earlie Server, MD  lisinopril  (ZESTRIL) 10 MG tablet Take 1 tablet (10 mg total) by mouth daily. 08/26/21  Yes Tower, Wynelle Fanny, MD  pantoprazole (PROTONIX) 40 MG tablet Take 1 tablet (40 mg total) by mouth 2 (two) times daily before a meal. 30 minutes before breakfast 12/02/21 03/02/22 Yes Daejon Lich, Tally Due, MD    Allergies as of 12/02/2021 - Review Complete 12/01/2021  Allergen Reaction Noted   Nsaids  11/16/2021    Family History  Problem Relation Age of Onset   Hypertension Mother        died of GI bleed-unexpected    Hypertension Father    Heart disease Father 51       MI   Alcohol abuse Father    Heart disease Maternal Grandmother    Heart disease Paternal Grandmother     Social History   Socioeconomic History   Marital status: Married    Spouse name: Not on file   Number of children: Not on file   Years of education: Not on file   Highest education level: Not on file  Occupational History   Not on file  Tobacco Use   Smoking status: Former    Types: Cigarettes    Quit date: 2014    Years since quitting: 9.7   Smokeless tobacco: Never   Tobacco comments:    quit many years ago.  Vaping Use   Vaping Use: Never used  Substance and Sexual Activity   Alcohol use: Yes    Alcohol/week: 5.0  standard drinks of alcohol    Types: 2 Glasses of wine, 3 Cans of beer per week    Comment: occ   Drug use: No   Sexual activity: Yes    Partners: Male    Birth control/protection: None    Comment: husband has a vasectomy  Other Topics Concern   Not on file  Social History Narrative   Not on file   Social Determinants of Health   Financial Resource Strain: Not on file  Food Insecurity: Not on file  Transportation Needs: Not on file  Physical Activity: Not on file  Stress: Not on file  Social Connections: Not on file  Intimate Partner Violence: Not on file    Review of Systems: See HPI, otherwise negative ROS  Physical Exam: BP (!) 146/90   Pulse 65   Temp (!) 96.8 F (36 C) (Temporal)    Resp 18   Ht '5\' 5"'$  (1.651 m)   Wt 104.5 kg   SpO2 99%   BMI 38.35 kg/m  General:   Alert,  pleasant and cooperative in NAD Head:  Normocephalic and atraumatic. Neck:  Supple; no masses or thyromegaly. Lungs:  Clear throughout to auscultation.    Heart:  Regular rate and rhythm. Abdomen:  Soft, nontender and nondistended. Normal bowel sounds, without guarding, and without rebound.   Neurologic:  Alert and  oriented x4;  grossly normal neurologically.  Impression/Plan: Meagan Conway is here for an endoscopy to be performed for Chronic GERD and large hiatal hernia  Risks, benefits, limitations, and alternatives regarding  endoscopy have been reviewed with the patient.  Questions have been answered.  All parties agreeable.   Sherri Sear, MD  12/25/2021, 9:00 AM

## 2021-12-25 NOTE — Op Note (Signed)
[facility] Gastroenterology Patient Name: Meagan Conway Procedure Date: 12/25/2021 9:01 AM MRN: [id number] Account #: [id number] Date of Birth: June 05, 1960 Admit Type: Outpatient Age: 61 Room: [facility] ENDO ROOM 2 Gender: Female Note Status: Finalized Instrument Name: Upper Endoscope [id number] Procedure:             Upper GI endoscopy Indications:           Follow-up of gastro-esophageal reflux disease Providers:             Lin Landsman MD, MD Referring MD:          Lin Landsman MD, MD (Referring MD), Wynelle Fanny.                         Tower (Referring MD) Medicines:             General Anesthesia Complications:         No immediate complications. Estimated blood loss: None. Procedure:             Pre-Anesthesia Assessment:                        - Prior to the procedure, a History and Physical was                         performed, and patient medications and allergies were                         reviewed. The patient is competent. The risks and                         benefits of the procedure and the sedation options and                         risks were discussed with the patient. All questions                         were answered and informed consent was obtained.                         Patient identification and proposed procedure were                         verified by the physician, the nurse, the                         anesthesiologist, the anesthetist and the technician                         in the pre-procedure area in the procedure room in the                         endoscopy suite. Mental Status Examination: alert and                         oriented. Airway Examination: normal oropharyngeal                         airway and neck mobility. Respiratory Examination:  clear to auscultation. CV Examination: normal.                         Prophylactic Antibiotics: The patient does not require                          prophylactic antibiotics. Prior Anticoagulants: The                         patient has taken no previous anticoagulant or                         antiplatelet agents. ASA Grade Assessment: II - A                         patient with mild systemic disease. After reviewing                         the risks and benefits, the patient was deemed in                         satisfactory condition to undergo the procedure. The                         anesthesia plan was to use general anesthesia.                         Immediately prior to administration of medications,                         the patient was re-assessed for adequacy to receive                         sedatives. The heart rate, respiratory rate, oxygen                         saturations, blood pressure, adequacy of pulmonary                         ventilation, and response to care were monitored                         throughout the procedure. The physical status of the                         patient was re-assessed after the procedure.                        After obtaining informed consent, the endoscope was                         passed under direct vision. Throughout the procedure,                         the patient's blood pressure, pulse, and oxygen                         saturations were monitored continuously. The Endoscope  was introduced through the mouth, and advanced to the                         second part of duodenum. The upper GI endoscopy was                         accomplished without difficulty. The patient tolerated                         the procedure well. Findings:      The duodenal bulb and second portion of the duodenum were normal.      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 The Z-line was 35 cm       from the incisors. Biopsies were taken with a cold forceps for       Helicobacter pylori testing.      The gastroesophageal junction and examined esophagus were  normal.       Biopsies were taken with a cold forceps for histology. Impression:            - Normal duodenal bulb and second portion of the                         duodenum.                        - Large paraesophageal hernia. Biopsied.                        - Normal gastroesophageal junction and esophagus.                         Biopsied. Recommendation:        - Await pathology results.                        - Discharge patient to home (with escort).                        - Resume previous diet today.                        - Continue present medications.                        - Follow an antireflux regimen indefinitely.                        - Refer to a surgeon at appointment to be scheduled. Procedure Code(s):     --- Professional ---                        [phone], Esophagogastroduodenoscopy, flexible,                         transoral; with biopsy, single or multiple Diagnosis Code(s):     --- Professional ---                        K44.9, Diaphragmatic hernia without obstruction or  gangrene                        K21.9, Gastro-esophageal reflux disease without                         esophagitis CPT copyright 2019 American Medical Association. All rights reserved. The codes documented in this report are preliminary and upon coder review may  be revised to meet current compliance requirements. Dr. Ulyess Mort Lin Landsman MD, MD 12/25/2021 9:28:26 AM This report has been signed electronically. Number of Addenda: 0 Note Initiated On: 12/25/2021 9:01 AM Estimated Blood Loss:  Estimated blood loss: none.      [facility]

## 2021-12-25 NOTE — Anesthesia Preprocedure Evaluation (Signed)
Anesthesia Evaluation  Patient identified by MRN, date of birth, ID band Patient awake    Reviewed: Allergy & Precautions, NPO status , Patient's Chart, lab work & pertinent test results  History of Anesthesia Complications Negative for: history of anesthetic complications  Airway Mallampati: III  TM Distance: >3 FB Neck ROM: full    Dental no notable dental hx. (+) Dental Advidsory Given, Teeth Intact   Pulmonary neg pulmonary ROS, neg shortness of breath, neg COPD, former smoker,    Pulmonary exam normal        Cardiovascular hypertension, (-) Past MI and (-) CABG negative cardio ROS Normal cardiovascular exam     Neuro/Psych PSYCHIATRIC DISORDERS Depression negative neurological ROS     GI/Hepatic Neg liver ROS, GERD  Medicated,  Endo/Other  negative endocrine ROSBMI 38  Renal/GU negative Renal ROS  negative genitourinary   Musculoskeletal negative musculoskeletal ROS (+)   Abdominal (+) + obese,   Peds  Hematology negative hematology ROS (+) anemia ,   Anesthesia Other Findings Past Medical History: No date: Anemia No date: Depression     Comment:  with anxious features No date: GERD (gastroesophageal reflux disease) 05/2005: Hypertension     Comment:  H/O. no meds currently 10/03/2018: IDA (iron deficiency anemia) No date: Obesity No date: Vertigo     Comment:  last episode 03/18  Past Surgical History: 09/18/2021: COLONOSCOPY; N/A     Comment:  Procedure: COLONOSCOPY;  Surgeon: Lucilla Lame, MD;                Location: [place];  Service: Endoscopy;                Laterality: N/A; 08/27/2016: COLONOSCOPY WITH PROPOFOL; N/A     Comment:  Procedure: COLONOSCOPY WITH PROPOFOL;  Surgeon: Lucilla Lame, MD;  Location: [place];  Service:               Endoscopy;  Laterality: N/A; No date: ENDOMETRIAL ABLATION W/ NOVASURE 08/29/2017: ESOPHAGOGASTRODUODENOSCOPY (EGD)  WITH PROPOFOL; N/A     Comment:  Procedure: ESOPHAGOGASTRODUODENOSCOPY (EGD) WITH               PROPOFOL;  Surgeon: Lucilla Lame, MD;  Location: [place];  Service: Endoscopy;  Laterality: N/A;  pt               request early arrival time 09/28/2017: [place]; N/A     Comment:  Procedure: GIVENS CAPSULE STUDY;  Surgeon: Lucilla Lame,              MD;  Location: [facility] ENDOSCOPY;  Service: Endoscopy;                Laterality: N/A; 08/14/2002: OVARIAN CYST SURGERY No date: rectovaginal fistula repair  BMI    Body Mass Index: 38.35 kg/m      Reproductive/Obstetrics negative OB ROS                             Anesthesia Physical  Anesthesia Plan  ASA: 2  Anesthesia Plan: General   Post-op Pain Management: Minimal or no pain anticipated   Induction: Intravenous  PONV Risk Score and Plan: 3 and Propofol infusion, TIVA and Ondansetron  Airway Management Planned: Nasal Cannula  Additional Equipment: None  Intra-op Plan:   Post-operative Plan:   Informed Consent: I have reviewed the patients History and Physical, chart, labs and discussed the procedure including the risks, benefits and alternatives for the proposed anesthesia with the patient or authorized representative who has indicated his/her understanding and acceptance.     Dental advisory given  Plan Discussed with: CRNA and Surgeon  Anesthesia Plan Comments: (Discussed risks of anesthesia with patient, including possibility of difficulty with spontaneous ventilation under anesthesia necessitating airway intervention, PONV, and rare risks such as cardiac or respiratory or neurological events, and allergic reactions. Discussed the role of CRNA in patient's perioperative care. Patient understands.)        Anesthesia Quick Evaluation

## 2021-12-25 NOTE — Transfer of Care (Signed)
Immediate Anesthesia Transfer of Care Note  Patient: RAVINDER HOFLAND  Procedure(s) Performed: ESOPHAGOGASTRODUODENOSCOPY (EGD) WITH PROPOFOL  Patient Location: Endoscopy Unit  Anesthesia Type:General  Level of Consciousness: drowsy  Airway & Oxygen Therapy: Patient Spontanous Breathing  Post-op Assessment: Report given to RN and Post -op Vital signs reviewed and stable  Post vital signs: Reviewed and stable  Last Vitals:  Vitals Value Taken Time  BP 88/63 12/25/21 0928  Temp 36 C 12/25/21 0928  Pulse 82 12/25/21 0928  Resp 16 12/25/21 0928  SpO2 92 % 12/25/21 0928    Last Pain:  Vitals:   12/25/21 0928  TempSrc: Temporal  PainSc: Asleep         Complications: No notable events documented.

## 2021-12-25 NOTE — Anesthesia Postprocedure Evaluation (Signed)
Anesthesia Post Note  Patient: Meagan Conway  Procedure(s) Performed: ESOPHAGOGASTRODUODENOSCOPY (EGD) WITH PROPOFOL  Patient location during evaluation: Endoscopy Anesthesia Type: General Level of consciousness: awake and alert Pain management: pain level controlled Vital Signs Assessment: post-procedure vital signs reviewed and stable Respiratory status: spontaneous breathing, nonlabored ventilation, respiratory function stable and patient connected to nasal cannula oxygen Cardiovascular status: blood pressure returned to baseline and stable Postop Assessment: no apparent nausea or vomiting Anesthetic complications: no   No notable events documented.   Last Vitals:  Vitals:   12/25/21 0938 12/25/21 0948  BP: 118/75 123/78  Pulse: 85 77  Resp: 19 18  Temp:    SpO2: 94% 95%    Last Pain:  Vitals:   12/25/21 0948  TempSrc:   PainSc: 0-No pain                 Dimas Millin

## 2021-12-28 ENCOUNTER — Encounter: Payer: Self-pay | Admitting: Gastroenterology

## 2021-12-28 ENCOUNTER — Other Ambulatory Visit: Payer: Self-pay

## 2021-12-28 DIAGNOSIS — K449 Diaphragmatic hernia without obstruction or gangrene: Secondary | ICD-10-CM

## 2021-12-28 LAB — SURGICAL PATHOLOGY

## 2021-12-28 NOTE — Progress Notes (Signed)
Per EGD report refer to general surgery for hiatal hernia

## 2021-12-30 ENCOUNTER — Telehealth: Payer: Self-pay

## 2021-12-30 ENCOUNTER — Ambulatory Visit (INDEPENDENT_AMBULATORY_CARE_PROVIDER_SITE_OTHER): Payer: BC Managed Care – PPO | Admitting: Surgery

## 2021-12-30 ENCOUNTER — Encounter: Payer: Self-pay | Admitting: Surgery

## 2021-12-30 VITALS — BP 149/87 | HR 61 | Temp 99.0°F | Ht 65.0 in | Wt 235.4 lb

## 2021-12-30 DIAGNOSIS — K449 Diaphragmatic hernia without obstruction or gangrene: Secondary | ICD-10-CM

## 2021-12-30 DIAGNOSIS — Z7689 Persons encountering health services in other specified circumstances: Secondary | ICD-10-CM

## 2021-12-30 NOTE — Patient Instructions (Addendum)
Your CT is scheduled for 01/12/2022 at 8 am (arrive by 7:45 am) @ [facility]. Nothing to eat or drink 4 hours prior. Please pick up the contrast between now and the day before CT.   Your Barium Swallow is scheduled for 11/07/2023at 9 am (arrive by 8:45 am) @ [facility]. Nothing to eat or drink 3 hours prior.   A referral has been placed to Gynecology (Dr. Ouida Sills). They will call you for an appointment.  If you have any concerns or questions, please feel free to call our office.   Hiatal Hernia   A hiatal hernia occurs when part of the stomach slides above the muscle that separates the abdomen from the chest (diaphragm). A person can be born with a hiatal hernia (congenital), or it may develop over time. In almost all cases of hiatal hernia, only the top part of the stomach pushes through the diaphragm. Many people have a hiatal hernia with no symptoms. The larger the hernia, the more likely it is that you will have symptoms. In some cases, a hiatal hernia allows stomach acid to flow back into the tube that carries food from your mouth to your stomach (esophagus). This may cause heartburn symptoms. The development of heartburn symptoms may mean that you have a condition called gastroesophageal reflux disease (GERD). What are the causes? This condition is caused by a weakness in the opening (hiatus) where the esophagus passes through the diaphragm to attach to the upper part of the stomach. A person may be born with a weakness in the hiatus, or a weakness can develop over time. What increases the risk? This condition is more likely to develop in: Older people. Age is a major risk factor for a hiatal hernia, especially if you are over the age of 17. Pregnant women. People who are overweight. People who have frequent constipation. What are the signs or symptoms? Symptoms of this condition usually develop in the form of GERD symptoms. Symptoms include: Heartburn. Upset stomach  (indigestion). Trouble swallowing. Coughing or wheezing. Wheezing is making high-pitched whistling sounds when you breathe. Sore throat. Chest pain. Nausea and vomiting. How is this diagnosed? This condition may be diagnosed during testing for GERD. Tests that may be done include: X-rays of your stomach or chest. An upper gastrointestinal (GI) series. This is an X-ray exam of your GI tract that is taken after you swallow a chalky liquid that shows up clearly on the X-ray. Endoscopy. This is a procedure to look into your stomach using a thin, flexible tube that has a tiny camera and light on the end of it. How is this treated? This condition may be treated by: Dietary and lifestyle changes to help reduce GERD symptoms. Medicines. These may include: Over-the-counter antacids. Medicines that make your stomach empty more quickly. Medicines that block the production of stomach acid (H2 blockers). Stronger medicines to reduce stomach acid (proton pump inhibitors). Surgery to repair the hernia, if other treatments are not helping. If you have no symptoms, you may not need treatment. Follow these instructions at home: Lifestyle and activity Do not use any products that contain nicotine or tobacco. These products include cigarettes, chewing tobacco, and vaping devices, such as e-cigarettes. If you need help quitting, ask your health care provider. Try to achieve and maintain a healthy body weight. Avoid putting pressure on your abdomen. Anything that puts pressure on your abdomen increases the amount of acid that may be pushed up into your esophagus. Avoid bending over,  especially after eating. Raise the head of your bed by putting blocks under the legs. This keeps your head and esophagus higher than your stomach. Do not wear tight clothing around your chest or stomach. Try not to strain when having a bowel movement, when urinating, or when lifting heavy objects. Eating and drinking Avoid foods  that can worsen GERD symptoms. These may include: Fatty foods, like fried foods. Citrus fruits, like oranges or lemon. Other foods and drinks that contain acid, like orange juice or tomatoes. Spicy food. Chocolate. Eat frequent small meals instead of three large meals a day. This helps prevent your stomach from getting too full. Eat slowly. Do not lie down right after eating. Do not eat 1-2 hours before bed. Do not drink beverages with caffeine. These include cola, coffee, cocoa, and tea. Do not drink alcohol. General instructions Take over-the-counter and prescription medicines only as told by your health care provider. Keep all follow-up visits. Your health care provider will want to check that any new prescribed medicines are helping your symptoms. Contact a health care provider if: Your symptoms are not controlled with medicines or lifestyle changes. You are having trouble swallowing. You have coughing or wheezing that will not go away. Your pain is getting worse. Your pain spreads to your arms, neck, jaw, teeth, or back. You feel nauseous or you vomit. Get help right away if: You have shortness of breath. You vomit blood. You have bright red blood in your stools. You have black, tarry stools. These symptoms may be an emergency. Get help right away. Call 911. Do not wait to see if the symptoms will go away. Do not drive yourself to the hospital. Summary A hiatal hernia occurs when part of the stomach slides above the muscle that separates the abdomen from the chest. A person may be born with a weakness in the hiatus, or a weakness can develop over time. Symptoms of a hiatal hernia may include heartburn, trouble swallowing, or sore throat. Management of a hiatal hernia includes eating frequent small meals instead of three large meals a day. Get help right away if you vomit blood, have bright red blood in your stools, or have black, tarry stools. This information is not intended  to replace advice given to you by your health care provider. Make sure you discuss any questions you have with your health care provider. Document Revised: 04/28/2021 Document Reviewed: 04/28/2021 Elsevier Patient Education  [place].

## 2021-12-30 NOTE — Progress Notes (Unsigned)
Patient ID: Meagan Conway, female   DOB: Aug 04, 1960, 61 y.o.   MRN: [id number]  HPI Meagan Conway is a 61 y.o. female seen in consultation at the request of Dr. Marius Ditch for paraesophageal hernia and reflux..  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  She developed this fistula during the delivery of her child more than 20 years ago.  She underwent repair which failed.  Since then, she has been experiencing leakage of stools per vaginal.  Patient is currently followed by Dr. Wannetta Sender, urogynecologist for her rectovaginal fistula.  The plan is for her to undergo repair in a few months   She endorses reflux. She uses NSAIDS.  She underwent MR  ( personally reviewed) abdomen and pelvis on 10/06/2021, there was no clear visualization of rectovaginal fistula.  She does have large hiatal hernia,  Exam was limited .cholelithiasis and mild mucosal hyperenhancement of the terminal ileum without evidence of significant wall thickening, stricture, fistula or abscess. Please note that I have personally reviewed the MRI Dr. Marius Ditch thinks that her changes of enteritis are likely related to NSAIDs and not to Crohn's disease. CMP and CBC is nml  HPI  Past Medical History:  Diagnosis Date   Anemia    Depression    with anxious features   GERD (gastroesophageal reflux disease)    Hypertension 05/2005   H/O. no meds currently   IDA (iron deficiency anemia) 10/03/2018   Obesity    Vertigo    last episode 03/18    Past Surgical History:  Procedure Laterality Date   COLONOSCOPY N/A 09/18/2021   Procedure: COLONOSCOPY;  Surgeon: Lucilla Lame, MD;  Location: [place];  Service: Endoscopy;  Laterality: N/A;   COLONOSCOPY WITH PROPOFOL N/A  08/27/2016   Procedure: COLONOSCOPY WITH PROPOFOL;  Surgeon: Lucilla Lame, MD;  Location: [place];  Service: Endoscopy;  Laterality: N/A;   ENDOMETRIAL ABLATION W/ NOVASURE     ESOPHAGOGASTRODUODENOSCOPY (EGD) WITH PROPOFOL N/A 08/29/2017   Procedure: ESOPHAGOGASTRODUODENOSCOPY (EGD) WITH PROPOFOL;  Surgeon: Lucilla Lame, MD;  Location: [place];  Service: Endoscopy;  Laterality: N/A;  pt request early arrival time   ESOPHAGOGASTRODUODENOSCOPY (EGD) WITH PROPOFOL N/A 12/25/2021   Procedure: ESOPHAGOGASTRODUODENOSCOPY (EGD) WITH PROPOFOL;  Surgeon: Lin Landsman, MD;  Location: [place];  Service: Gastroenterology;  Laterality: N/A;   GIVENS CAPSULE STUDY N/A 09/28/2017   Procedure: GIVENS CAPSULE STUDY;  Surgeon: Lucilla Lame, MD;  Location: [facility] ENDOSCOPY;  Service: Endoscopy;  Laterality: N/A;   OVARIAN CYST SURGERY  08/14/2002   rectovaginal fistula repair      Family History  Problem Relation Age of Onset   Hypertension Mother        died of GI bleed-unexpected    Hypertension Father    Heart disease Father 41       MI   Alcohol abuse Father    Heart disease Maternal Grandmother    Heart disease Paternal Grandmother     Social History Social History   Tobacco Use   Smoking status: Former    Types: Cigarettes    Quit date: 2014    Years since quitting: 9.8   Smokeless tobacco: Never   Tobacco comments:    quit many years ago.  Vaping Use  Vaping Use: Never used  Substance Use Topics   Alcohol use: Yes    Alcohol/week: 5.0 standard drinks of alcohol    Types: 2 Glasses of wine, 3 Cans of beer per week    Comment: occ   Drug use: No    Allergies  Allergen Reactions   Nsaids     Current Outpatient Medications  Medication Sig Dispense Refill   Cholecalciferol (D3 PO) Take by mouth.     FLUoxetine (PROZAC) 20 MG capsule Take 1 capsule (20 mg total) by mouth daily. 90 capsule 3   Iron-Vitamin C (VITRON-C) 65-125 MG TABS Take 1  tablet by mouth daily. 120 tablet 0   lisinopril (ZESTRIL) 10 MG tablet Take 1 tablet (10 mg total) by mouth daily. 90 tablet 3   pantoprazole (PROTONIX) 40 MG tablet Take 1 tablet (40 mg total) by mouth 2 (two) times daily before a meal. 30 minutes before breakfast 180 tablet 2   No current facility-administered medications for this visit.     Review of Systems Full ROS  was asked and was negative except for the information on the HPI  Physical Exam Blood pressure (!) 149/87, pulse 61, temperature 99 F (37.2 C), temperature source Oral, height '5\' 5"'$  (1.651 m), weight 235 lb 6.4 oz (106.8 kg), SpO2 97 %. CONSTITUTIONAL: NAD BMI 39. EYES: Pupils are equal, round, and reactive to light, Sclera are non-icteric. EARS, NOSE, MOUTH AND THROAT: The oral mucosa is pink and moist. Hearing is intact to voice. LYMPH NODES:  Lymph nodes in the neck are normal. RESPIRATORY:  Lungs are clear. There is normal respiratory effort, with equal breath sounds bilaterally, and without pathologic use of accessory muscles. CARDIOVASCULAR: Heart is regular without murmurs, gallops, or rubs. GI: The abdomen is .  Soft, nontender, and nondistended. There are no palpable masses. There is no hepatosplenomegaly. There are normal bowel sounds in all quadrants. GU: Rectal deferred.   MUSCULOSKELETAL: Normal muscle strength and tone. No cyanosis or edema.   SKIN: Turgor is good and there are no pathologic skin lesions or ulcers. NEUROLOGIC: Motor and sensation is grossly normal. Cranial nerves are grossly intact. PSYCH:  Oriented to person, place and time. Affect is normal.  Data Reviewed  I have personally reviewed the patient's imaging, laboratory findings and medical records.    Assessment/Plan 61 year-old female with symptomatic paraesophageal hernia. I do think that the hernia is causing  symptoms.   We will pursue further w/u w CT A/P and barium swallow.  She will be a reasonable candidate for robotic  approach.  I had an extensive discussion with patient regarding her disease process.  I anticipate that we will be to repair the hernia and do a Nissen wrap. She is not ready yet from w/u and optimization perspective. We did discuss about robotic repair, risks, benefits and possible complications as well as post operative course   BMI 38 and I encourage her to optimize her weight. I had extensive discussion about weight management. She is very motivated and we will refer to Dr. Ouida Sills for medical weight rx.    Please note that I spent greater than 60 minutes in this encounter including coordination of her care, pers reviewing images ,counseling the patient and performing appropriate documentation Copy  report was sent to the referring provider  Caroleen Hamman, MD FACS General Surgeon  12/30/2021, 3:33 PM

## 2021-12-30 NOTE — Telephone Encounter (Signed)
Faxed referral to Dr. Laverta Baltimore at [phone].

## 2022-01-11 ENCOUNTER — Encounter: Payer: Self-pay | Admitting: Obstetrics and Gynecology

## 2022-01-12 ENCOUNTER — Ambulatory Visit
Admission: RE | Admit: 2022-01-12 | Discharge: 2022-01-12 | Disposition: A | Discharge status: Home / Self Care | Payer: BC Managed Care – PPO | Source: Ambulatory Visit | Attending: Surgery | Admitting: Surgery

## 2022-01-12 DIAGNOSIS — K3189 Other diseases of stomach and duodenum: Secondary | ICD-10-CM | POA: Diagnosis not present

## 2022-01-12 DIAGNOSIS — K828 Other specified diseases of gallbladder: Secondary | ICD-10-CM | POA: Diagnosis not present

## 2022-01-12 DIAGNOSIS — K449 Diaphragmatic hernia without obstruction or gangrene: Secondary | ICD-10-CM | POA: Diagnosis not present

## 2022-01-12 MED ORDER — IOHEXOL 300 MG/ML  SOLN
100.0000 mL | Freq: Once | INTRAMUSCULAR | Status: AC | PRN
  Administered 2022-01-12: 100 mL via INTRAVENOUS

## 2022-01-14 ENCOUNTER — Telehealth: Payer: Self-pay

## 2022-01-14 NOTE — Telephone Encounter (Signed)
-----   Message from Jules Husbands, MD sent at 01/14/2022  9:36 AM EDT ----- Please let her know CT shows giant paraesophageal hernia, keep appropiate w/u an f/u appts ----- Message ----- From: Interface, Rad Results In Sent: 01/14/2022   8:19 AM EDT To: Jules Husbands, MD

## 2022-01-14 NOTE — Telephone Encounter (Signed)
Notified patient as instructed, patient pleased. Discussed follow-up appointments, patient agrees  

## 2022-01-19 ENCOUNTER — Telehealth: Payer: Self-pay

## 2022-01-19 ENCOUNTER — Ambulatory Visit
Admission: RE | Admit: 2022-01-19 | Discharge: 2022-01-19 | Disposition: A | Discharge status: Home / Self Care | Payer: BC Managed Care – PPO | Source: Ambulatory Visit | Attending: Surgery | Admitting: Surgery

## 2022-01-19 DIAGNOSIS — K224 Dyskinesia of esophagus: Secondary | ICD-10-CM | POA: Diagnosis not present

## 2022-01-19 DIAGNOSIS — K449 Diaphragmatic hernia without obstruction or gangrene: Secondary | ICD-10-CM | POA: Insufficient documentation

## 2022-01-19 DIAGNOSIS — K219 Gastro-esophageal reflux disease without esophagitis: Secondary | ICD-10-CM | POA: Diagnosis not present

## 2022-01-19 NOTE — Telephone Encounter (Signed)
Patient notified of Barium swallow study. Reminded to keep appointment.

## 2022-02-09 ENCOUNTER — Encounter: Payer: BC Managed Care – PPO | Admitting: Obstetrics and Gynecology

## 2022-02-09 DIAGNOSIS — E669 Obesity, unspecified: Secondary | ICD-10-CM | POA: Diagnosis not present

## 2022-02-09 DIAGNOSIS — Z6838 Body mass index (BMI) 38.0-38.9, adult: Secondary | ICD-10-CM | POA: Diagnosis not present

## 2022-02-17 ENCOUNTER — Telehealth (INDEPENDENT_AMBULATORY_CARE_PROVIDER_SITE_OTHER): Payer: BC Managed Care – PPO | Admitting: Obstetrics and Gynecology

## 2022-02-17 DIAGNOSIS — N823 Fistula of vagina to large intestine: Secondary | ICD-10-CM | POA: Diagnosis not present

## 2022-02-17 MED ORDER — OXYCODONE HCL 5 MG PO TABS: 5.0000 mg | ORAL_TABLET | ORAL | 0 refills | Status: DC | PRN

## 2022-02-17 MED ORDER — ACETAMINOPHEN 500 MG PO TABS: 500.0000 mg | ORAL_TABLET | Freq: Four times a day (QID) | ORAL | 0 refills | Status: DC | PRN

## 2022-02-17 MED ORDER — POLYETHYLENE GLYCOL 3350 17 GM/SCOOP PO POWD: 17.0000 g | Freq: Every day | ORAL | 1 refills | Status: DC

## 2022-02-17 NOTE — Progress Notes (Signed)
[facility] Pre-Operative visit- video  Patient consented to video visit. Patient location: home Provider location: office She was the only person on the call participating in the visit.   Subjective Chief Complaint: Meagan Conway presents for a preoperative encounter.   History of Present Illness: Meagan Conway is a 61 y.o. female who presents for preoperative visit.  She is scheduled to undergo Exam under anesthesia, rectovaginal fistula repair on 03/01/22. Previously noted to have fistula with stool leaking through vagina. No fistula noted on exam in office but patient has had symptoms.    Past Medical History:  Diagnosis Date   Anemia    Depression    with anxious features   GERD (gastroesophageal reflux disease)    Hypertension 05/2005   H/O. no meds currently   IDA (iron deficiency anemia) 10/03/2018   Obesity    Vertigo    last episode 03/18     Past Surgical History:  Procedure Laterality Date   COLONOSCOPY N/A 09/18/2021   Procedure: COLONOSCOPY;  Surgeon: Lucilla Lame, MD;  Location: [place];  Service: Endoscopy;  Laterality: N/A;   COLONOSCOPY WITH PROPOFOL N/A 08/27/2016   Procedure: COLONOSCOPY WITH PROPOFOL;  Surgeon: Lucilla Lame, MD;  Location: [place];  Service: Endoscopy;  Laterality: N/A;   ENDOMETRIAL ABLATION W/ NOVASURE     ESOPHAGOGASTRODUODENOSCOPY (EGD) WITH PROPOFOL N/A 08/29/2017   Procedure: ESOPHAGOGASTRODUODENOSCOPY (EGD) WITH PROPOFOL;  Surgeon: Lucilla Lame, MD;  Location: [place];  Service: Endoscopy;  Laterality: N/A;  pt request early arrival time   ESOPHAGOGASTRODUODENOSCOPY (EGD) WITH PROPOFOL N/A 12/25/2021   Procedure: ESOPHAGOGASTRODUODENOSCOPY (EGD) WITH PROPOFOL;  Surgeon: Lin Landsman, MD;  Location: [place];  Service: Gastroenterology;  Laterality: N/A;   GIVENS CAPSULE STUDY N/A 09/28/2017   Procedure: GIVENS CAPSULE STUDY;  Surgeon: Lucilla Lame, MD;  Location:  [facility] ENDOSCOPY;  Service: Endoscopy;  Laterality: N/A;   OVARIAN CYST SURGERY  08/14/2002   rectovaginal fistula repair      is allergic to nsaids.   Family History  Problem Relation Age of Onset   Hypertension Mother        died of GI bleed-unexpected    Hypertension Father    Heart disease Father 40       MI   Alcohol abuse Father    Heart disease Maternal Grandmother    Heart disease Paternal Grandmother     Social History   Tobacco Use   Smoking status: Former    Types: Cigarettes    Quit date: 2014    Years since quitting: 9.9   Smokeless tobacco: Never   Tobacco comments:    quit many years ago.  Vaping Use   Vaping Use: Never used  Substance Use Topics   Alcohol use: Yes    Alcohol/week: 5.0 standard drinks of alcohol    Types: 2 Glasses of wine, 3 Cans of beer per week    Comment: occ   Drug use: No     Review of Systems was negative for a full 10 system review except as noted in the History of Present Illness.   Current Outpatient Medications:    Cholecalciferol (D3 PO), Take by mouth., Disp: , Rfl:    FLUoxetine (PROZAC) 20 MG capsule, Take 1 capsule (20 mg total) by mouth daily., Disp: 90 capsule, Rfl: 3   Iron-Vitamin C (VITRON-C) 65-125 MG TABS, Take 1 tablet by mouth daily., Disp: 120 tablet, Rfl: 0   lisinopril (ZESTRIL) 10 MG tablet,  Take 1 tablet (10 mg total) by mouth daily., Disp: 90 tablet, Rfl: 3   pantoprazole (PROTONIX) 40 MG tablet, Take 1 tablet (40 mg total) by mouth 2 (two) times daily before a meal. 30 minutes before breakfast, Disp: 180 tablet, Rfl: 2   Objective There were no vitals filed for this visit.  Gen: NAD CV: S1 S2 RRR Lungs: Clear to auscultation bilaterally Abd: soft, nontender   Previous Pelvic Exam showed: Speculum exam reveals normal vaginal mucosa with atrophy. Stool noted at the introitus to the right, coming through a rugation but unable to identify specific fistula tract. Cervix normal appearance. Uterus  normal single, nontender. Adnexa normal adnexa.      Assessment/ Plan  Assessment: The patient is a 61 y.o. year old scheduled to undergo Exam under anesthesia, rectovaginal fistula repair. Verbal consent was obtained for these procedures.  Plan: General Surgical Consent: The patient has previously been counseled on alternative treatments, and the decision by the patient and provider was to proceed with the procedure listed above.  For all procedures, there are risks of bleeding, infection, damage to surrounding organs including but not limited to bowel, bladder, blood vessels, ureters and nerves, and need for further surgery if an injury were to occur. These risks are all low with minimally invasive surgery.   There are risks of numbness and weakness at any body site or buttock/rectal pain.  It is possible that baseline pain can be worsened by surgery, either with or without mesh. If surgery is vaginal, there is also a low risk of possible conversion to laparoscopy or open abdominal incision where indicated. Very rare risks include blood transfusion, blood clot, heart attack, pneumonia, or death.   There is also a risk of short-term postoperative urinary retention with need to use a catheter. About half of patients need to go home from surgery with a catheter, which is then later removed in the office. The risk of long-term need for a catheter is very low. There is also a risk of worsening of overactive bladder.   We discussed consent for blood products. Risks for blood transfusion include allergic reactions, other reactions that can affect different body organs and managed accordingly, transmission of infectious diseases such as HIV or Hepatitis. However, the blood is screened. Patient consents for blood products.  Pre-operative instructions:  She was instructed to not take Aspirin/NSAIDs x 7days prior to surgery. Antibiotic prophylaxis was ordered as indicated.  Catheter use: Patient will go  home with foley if needed after post-operative voiding trial.  Post-operative instructions:  She was provided with specific post-operative instructions, including precautions and signs/symptoms for which we would recommend contacting us, in addition to daytime and after-hours contact phone numbers. This was provided on a handout.   Post-operative medications: Prescriptions for  tylenol, miralax, and oxycodone were sent to her pharmacy. Due to colonic ulcerations, she is avoiding NSAIDs  Laboratory testing:  none  Preoperative clearance:  She does not require surgical clearance.    Post-operative follow-up:  A post-operative appointment will be made for 6 weeks from the date of surgery. If she needs a post-operative nurse visit for a voiding trial, that will be set up after she leaves the hospital.    Patient will call the clinic or use MyChart should anything change or any new issues arise.   Jaquita Folds, MD  Time spent: I spent 20 minutes dedicated to the care of this patient on the date of this encounter to include pre-visit review  of records, face-to-face time with the patient and post visit documentation and ordering medication/ testing.

## 2022-02-17 NOTE — Patient Instructions (Addendum)
Pre-operative Instructions  Location: [facility] ([place], [place], [place] [address])  Do not take NSAIDS (ibuprofen, naproxen, advil, aleve, motrin, or >'81mg'$  aspirin) for 7 days prior to surgery. If you take a baby aspirin ('81mg'$ ), you can continue this up until surgery.  No eating or drinking after midnight on the day of your surgery (unless otherwise instructed by pre-op nurse). If you need to take medications in the morning, take with a small sip of water.  If labs were ordered for you, they will be done at your Pre-op appointment at the hospital.  Medications have been sent to your pharmacy (see your AVS for your list of medications).  Take one bottle of miralax dissolved in several glasses of water the day prior to surgery. Take in the first 12 hours of the day.   POST OPERATIVE INSTRUCTIONS  General Instructions Recovery (not bed rest) will last approximately 6 weeks Walking is encouraged, but refrain from strenuous exercise/ housework/ heavy lifting. No lifting >10lbs  Nothing in the vagina- NO intercourse, tampons or douching Bathing:  Do not submerge in water (NO swimming, bath, hot tub, etc) until after your postop visit. You can shower starting the day after surgery.  No driving until you are not taking narcotic pain medicine and until your pain is well enough controlled that you can slam on the breaks or make sudden movements if needed.   Taking your medications Please take your acetaminophen and ibuprofen on a schedule for the first 48 hours. Take '600mg'$  ibuprofen, then take '500mg'$  acetaminophen 3 hours later, then continue to alternate ibuprofen and acetaminophen. That way you are taking each type of medication every 6 hours. Take the prescribed narcotic (oxycodone, tramadol, etc) as needed, with a maximum being every 4 hours.  Take a stool softener daily to keep your stools soft and preventing you from straining. If you have diarrhea, you decrease your stool  softener. This is explained more below. We have prescribed you Miralax.  Reasons to Call the Nurse (see last page for phone numbers) Heavy Bleeding (changing your pad every 1-2 hours) Persistent nausea/vomiting Fever (100.4 degrees or more) Incision problems (pus or other fluid coming out, redness, warmth, increased pain)  Things to Expect After Surgery Mild to Moderate pain is normal during the first day or two after surgery. If prescribed, take Ibuprofen or Tylenol first and use the stronger medicine for "break-through" pain. You can overlap these medicines because they work differently.   Constipation   To Prevent Constipation:  Eat a well-balanced diet including protein, grains, fresh fruit and vegetables.  Drink plenty of fluids. Walk regularly.  Depending on specific instructions from your physician: take Miralax daily and additionally you can add a stool softener (colace/ docusate) and fiber supplement. Continue as long as you're on pain medications.   To Treat Constipation:  If you do not have a bowel movement in 2 days after surgery, you can take 2 Tbs of Milk of Magnesia 1-2 times a day until you have a bowel movement. If diarrhea occurs, decrease the amount or stop the laxative. If no results with Milk of Magnesia, you can drink a bottle of magnesium citrate which you can purchase over the counter.  Fatigue:  This is a normal response to surgery and will improve with time.  Plan frequent rest periods throughout the day.  Gas Pain:  This is very common but can also be very painful! Drink warm liquids such as herbal teas, bouillon or  soup. Walking will help you pass more gas.  Mylicon or Gas-X can be taken over the counter.   Incisions: If you have incisions on your abdomen, the skin glue will dissolve on its own over time. It is ok to gently rinse with soap and water over these incisions but do not scrub.  Catheter Approximately 50% of patients are unable to urinate after surgery  and need to go home with a catheter. This allows your bladder to rest so it can return to full function. If you go home with a catheter, the office will call to set up a voiding trial a few days after surgery. For most patients, by this visit, they are able to urinate on their own. Long term catheter use is rare.   Return to Work  As work demands and recovery times vary widely, it is hard to predict when you will want to return to work. If you have a desk job with no strenuous physical activity, and if you would like to return sooner than generally recommended, discuss this with your provider or call our office.   Post op concerns  For non-emergent issues, please call the Urogynecology Nurse. Please leave a message and someone will contact you within one business day.  You can also send a message through [place].   AFTER HOURS (After 5:00 PM and on weekends):  For urgent matters that cannot wait until the next business day. Call our office [phone] and connect to the doctor on call.  Please reserve this for important issues.   **FOR ANY TRUE EMERGENCY ISSUES CALL 911 OR GO TO THE NEAREST EMERGENCY ROOM.** Please inform our office or the doctor on call of any emergency.     APPOINTMENTS: Call [phone]

## 2022-02-18 NOTE — H&P (Signed)
[facility] Pre-Operative H&P  Subjective Chief Complaint: Meagan Conway presents for a preoperative encounter.   History of Present Illness: Meagan Conway is a 61 y.o. female who presents for preoperative visit.  She is scheduled to undergo Exam under anesthesia, rectovaginal fistula repair on 03/01/22. Previously noted to have fistula with stool leaking through vagina. No fistula noted on exam in office but patient has had symptoms.    Past Medical History:  Diagnosis Date   Anemia    Depression    with anxious features   GERD (gastroesophageal reflux disease)    Hypertension 05/2005   H/O. no meds currently   IDA (iron deficiency anemia) 10/03/2018   Obesity    Vertigo    last episode 03/18     Past Surgical History:  Procedure Laterality Date   COLONOSCOPY N/A 09/18/2021   Procedure: COLONOSCOPY;  Surgeon: Lucilla Lame, MD;  Location: [facility];  Service: Endoscopy;  Laterality: N/A;   COLONOSCOPY WITH PROPOFOL N/A 08/27/2016   Procedure: COLONOSCOPY WITH PROPOFOL;  Surgeon: Lucilla Lame, MD;  Location: [facility];  Service: Endoscopy;  Laterality: N/A;   ENDOMETRIAL ABLATION W/ NOVASURE     ESOPHAGOGASTRODUODENOSCOPY (EGD) WITH PROPOFOL N/A 08/29/2017   Procedure: ESOPHAGOGASTRODUODENOSCOPY (EGD) WITH PROPOFOL;  Surgeon: Lucilla Lame, MD;  Location: [facility];  Service: Endoscopy;  Laterality: N/A;  pt request early arrival time   ESOPHAGOGASTRODUODENOSCOPY (EGD) WITH PROPOFOL N/A 12/25/2021   Procedure: ESOPHAGOGASTRODUODENOSCOPY (EGD) WITH PROPOFOL;  Surgeon: Lin Landsman, MD;  Location: [facility];  Service: Gastroenterology;  Laterality: N/A;   GIVENS CAPSULE STUDY N/A 09/28/2017   Procedure: GIVENS CAPSULE STUDY;  Surgeon: Lucilla Lame, MD;  Location[facility] END[facility]COPY;  Service: Endoscopy;  Laterality: N/A;   OVARIAN CYST SURGERY  08/14/2002   rectovaginal fistula repair      is allergic to nsaids.   Family  History  Problem Relation Age of Onset   Hypertension Mother        died of GI bleed-unexpected    Hypertension Father    Heart disease Father 70       MI   Alcohol abuse Father    Heart disease Maternal Grandmother    Heart disease Paternal Grandmother     Social History   Tobacco Use   Smoking status: Former    Types: Cigarettes    Quit date: 2014    Years since quitting: 9.9   Smokeless tobacco: Never   Tobacco comments:    quit many years ago.  Vaping Use   Vaping Use: Never used  Substance Use Topics   Alcohol use: Yes    Alcohol/week: 5.0 standard drinks of alcohol    Types: 2 Glasses of wine, 3 Cans of beer per week    Comment: occ   Drug use: No     Review of Systems was negative for a full 10 system review except as noted in the History of Present Illness.  No current facility-administered medications for this encounter.  Current Outpatient Medications:    acetaminophen (TYLENOL) 500 MG tablet, Take 1 tablet (500 mg total) by mouth every 6 (six) hours as needed (pain)., Disp: 30 tablet, Rfl: 0   Cholecalciferol (D3 PO), Take by mouth., Disp: , Rfl:    FLUoxetine (PROZAC) 20 MG capsule, Take 1 capsule (20 mg total) by mouth daily., Disp: 90 capsule, Rfl: 3   Iron-Vitamin C (VITRON-C) 65-125 MG TABS, Take 1 tablet by mouth daily., Disp: 120 tablet, Rfl:  0   lisinopril (ZESTRIL) 10 MG tablet, Take 1 tablet (10 mg total) by mouth daily., Disp: 90 tablet, Rfl: 3   oxyCODONE (OXY IR/ROXICODONE) 5 MG immediate release tablet, Take 1 tablet (5 mg total) by mouth every 4 (four) hours as needed for severe pain., Disp: 10 tablet, Rfl: 0   pantoprazole (PROTONIX) 40 MG tablet, Take 1 tablet (40 mg total) by mouth 2 (two) times daily before a meal. 30 minutes before breakfast, Disp: 180 tablet, Rfl: 2   polyethylene glycol powder (GLYCOLAX/MIRALAX) 17 GM/SCOOP powder, Take 17 g by mouth daily. Drink 17g (1 scoop) dissolved in water per day., Disp: 255 g, Rfl: 1    Objective There were no vitals filed for this visit.  Gen: NAD CV: S1 S2 RRR Lungs: Clear to auscultation bilaterally Abd: soft, nontender   Previous Pelvic Exam showed: Speculum exam reveals normal vaginal mucosa with atrophy. Stool noted at the introitus to the right, coming through a rugation but unable to identify specific fistula tract. Cervix normal appearance. Uterus normal single, nontender. Adnexa normal adnexa.      Assessment/ Plan  The patient is a 61 y.o. 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   Jaquita Folds, MD

## 2022-02-19 ENCOUNTER — Encounter: Payer: BC Managed Care – PPO | Admitting: Obstetrics and Gynecology

## 2022-02-22 ENCOUNTER — Ambulatory Visit: Payer: BC Managed Care – PPO | Admitting: Surgery

## 2022-02-24 ENCOUNTER — Encounter (HOSPITAL_BASED_OUTPATIENT_CLINIC_OR_DEPARTMENT_OTHER): Payer: Self-pay | Admitting: Obstetrics and Gynecology

## 2022-02-24 NOTE — Progress Notes (Signed)
Spoke w/ via phone for pre-op interview--- pt Lab needs dos----  [employer], ekg             Lab results------ no COVID test -----patient states asymptomatic no test needed Arrive at ------- 0730 on 03-01-2022 NPO after MN NO Solid Food.  Only water  from MN until--- 0730 Med rec completed Medications to take morning of surgery ----- protonix, prozac Diabetic medication ----- n/a Patient instructed no nail polish to be worn day of surgery Patient instructed to bring photo id and insurance card day of surgery Patient aware to have Driver (ride ) / caregiver    for 24 hours after surgery -- husband, Meagan Conway Patient Special Instructions ----- n/a Pre-Op special Istructions ----- n/a Patient verbalized understanding of instructions that were given at this phone interview. Patient denies shortness of breath, chest pain, fever, cough at this phone interview.

## 2022-02-26 NOTE — Progress Notes (Addendum)
Patient notified to come in at [facility] on 03/01/2022.

## 2022-03-01 ENCOUNTER — Other Ambulatory Visit: Payer: Self-pay

## 2022-03-01 ENCOUNTER — Ambulatory Visit (HOSPITAL_BASED_OUTPATIENT_CLINIC_OR_DEPARTMENT_OTHER)
Admission: RE | Admit: 2022-03-01 | Discharge: 2022-03-01 | Disposition: A | Discharge status: Home / Self Care | Payer: BC Managed Care – PPO | Attending: Obstetrics and Gynecology | Admitting: Obstetrics and Gynecology

## 2022-03-01 ENCOUNTER — Encounter (HOSPITAL_BASED_OUTPATIENT_CLINIC_OR_DEPARTMENT_OTHER)
Admission: RE | Disposition: A | Discharge status: Home / Self Care | Payer: Self-pay | Source: Home / Self Care | Attending: Obstetrics and Gynecology

## 2022-03-01 ENCOUNTER — Ambulatory Visit (HOSPITAL_BASED_OUTPATIENT_CLINIC_OR_DEPARTMENT_OTHER): Payer: BC Managed Care – PPO | Admitting: Anesthesiology

## 2022-03-01 ENCOUNTER — Telehealth: Payer: Self-pay | Admitting: Obstetrics and Gynecology

## 2022-03-01 ENCOUNTER — Encounter (HOSPITAL_BASED_OUTPATIENT_CLINIC_OR_DEPARTMENT_OTHER): Payer: Self-pay | Admitting: Obstetrics and Gynecology

## 2022-03-01 DIAGNOSIS — Z87891 Personal history of nicotine dependence: Secondary | ICD-10-CM | POA: Insufficient documentation

## 2022-03-01 DIAGNOSIS — K449 Diaphragmatic hernia without obstruction or gangrene: Secondary | ICD-10-CM | POA: Insufficient documentation

## 2022-03-01 DIAGNOSIS — Z9889 Other specified postprocedural states: Secondary | ICD-10-CM

## 2022-03-01 DIAGNOSIS — Z01818 Encounter for other preprocedural examination: Secondary | ICD-10-CM

## 2022-03-01 DIAGNOSIS — K219 Gastro-esophageal reflux disease without esophagitis: Secondary | ICD-10-CM | POA: Diagnosis not present

## 2022-03-01 DIAGNOSIS — K279 Peptic ulcer, site unspecified, unspecified as acute or chronic, without hemorrhage or perforation: Secondary | ICD-10-CM | POA: Diagnosis not present

## 2022-03-01 DIAGNOSIS — N828 Other female genital tract fistulae: Secondary | ICD-10-CM | POA: Diagnosis not present

## 2022-03-01 DIAGNOSIS — I10 Essential (primary) hypertension: Secondary | ICD-10-CM | POA: Diagnosis not present

## 2022-03-01 DIAGNOSIS — N823 Fistula of vagina to large intestine: Secondary | ICD-10-CM | POA: Insufficient documentation

## 2022-03-01 HISTORY — DX: Personal history of other diseases of the digestive system: Z87.19

## 2022-03-01 HISTORY — PX: VESICO-VAGINAL FISTULA REPAIR: SHX5129

## 2022-03-01 LAB — POCT I-STAT, CHEM 8
BUN: 13 mg/dL (ref 6–20)
Calcium, Ion: 1.2 mmol/L (ref 1.15–1.40)
Chloride: 104 mmol/L (ref 98–111)
Creatinine, Ser: 0.8 mg/dL (ref 0.44–1.00)
Glucose, Bld: 125 mg/dL — ABNORMAL HIGH (ref 70–99)
HCT: 41 % (ref 36.0–46.0)
Hemoglobin: 13.9 g/dL (ref 12.0–15.0)
Potassium: 4.2 mmol/L (ref 3.5–5.1)
Sodium: 142 mmol/L (ref 135–145)
TCO2: 26 mmol/L (ref 22–32)

## 2022-03-01 SURGERY — CLOSURE, FISTULA, VESICOVAGINAL
Anesthesia: General | Site: Vagina

## 2022-03-01 MED ORDER — METRONIDAZOLE 500 MG/100ML IV SOLN
500.0000 mg | INTRAVENOUS | Status: AC
  Administered 2022-03-01: 500 mg via INTRAVENOUS

## 2022-03-01 MED ORDER — SODIUM CHLORIDE 0.9 % IR SOLN
Status: DC | PRN
  Administered 2022-03-01: 1000 mL

## 2022-03-01 MED ORDER — MIDAZOLAM HCL 2 MG/2ML IJ SOLN
INTRAMUSCULAR | Status: DC | PRN
  Administered 2022-03-01: 2 mg via INTRAVENOUS

## 2022-03-01 MED ORDER — LACTATED RINGERS IV SOLN: INTRAVENOUS | Status: DC

## 2022-03-01 MED ORDER — EPHEDRINE SULFATE-NACL 50-0.9 MG/10ML-% IV SOSY
PREFILLED_SYRINGE | INTRAVENOUS | Status: DC | PRN
  Administered 2022-03-01 (×4): 5 mg via INTRAVENOUS

## 2022-03-01 MED ORDER — MIDAZOLAM HCL 2 MG/2ML IJ SOLN
INTRAMUSCULAR | Status: AC
  Filled 2022-03-01: qty 2

## 2022-03-01 MED ORDER — POVIDONE-IODINE 10 % EX SWAB: 2.0000 | Freq: Once | CUTANEOUS | Status: DC

## 2022-03-01 MED ORDER — PROPOFOL 10 MG/ML IV BOLUS
INTRAVENOUS | Status: AC
  Filled 2022-03-01: qty 20

## 2022-03-01 MED ORDER — PROPOFOL 10 MG/ML IV BOLUS
INTRAVENOUS | Status: DC | PRN
  Administered 2022-03-01: 200 mg via INTRAVENOUS
  Administered 2022-03-01: 20 mg via INTRAVENOUS

## 2022-03-01 MED ORDER — LIDOCAINE-EPINEPHRINE 1 %-1:100000 IJ SOLN
INTRAMUSCULAR | Status: DC | PRN
  Administered 2022-03-01: 14 mL

## 2022-03-01 MED ORDER — ACETAMINOPHEN 500 MG PO TABS
1000.0000 mg | ORAL_TABLET | ORAL | Status: AC
  Administered 2022-03-01: 1000 mg via ORAL

## 2022-03-01 MED ORDER — LIDOCAINE 2% (20 MG/ML) 5 ML SYRINGE
INTRAMUSCULAR | Status: DC | PRN
  Administered 2022-03-01: 100 mg via INTRAVENOUS

## 2022-03-01 MED ORDER — CEFAZOLIN SODIUM-DEXTROSE 2-4 GM/100ML-% IV SOLN
2.0000 g | INTRAVENOUS | Status: AC
  Administered 2022-03-01: 2 g via INTRAVENOUS

## 2022-03-01 MED ORDER — TISSEEL 10 ML EX KIT
PACK | Freq: Once | CUTANEOUS | Status: DC
  Filled 2022-03-01: qty 1

## 2022-03-01 MED ORDER — ONDANSETRON HCL 4 MG/2ML IJ SOLN
INTRAMUSCULAR | Status: DC | PRN
  Administered 2022-03-01: 4 mg via INTRAVENOUS

## 2022-03-01 MED ORDER — FENTANYL CITRATE (PF) 250 MCG/5ML IJ SOLN
INTRAMUSCULAR | Status: DC | PRN
  Administered 2022-03-01: 25 ug via INTRAVENOUS
  Administered 2022-03-01: 50 ug via INTRAVENOUS
  Administered 2022-03-01: 25 ug via INTRAVENOUS

## 2022-03-01 MED ORDER — HYDROMORPHONE HCL 1 MG/ML IJ SOLN: 0.2500 mg | INTRAMUSCULAR | Status: DC | PRN

## 2022-03-01 MED ORDER — DEXAMETHASONE SODIUM PHOSPHATE 10 MG/ML IJ SOLN
INTRAMUSCULAR | Status: DC | PRN
  Administered 2022-03-01: 10 mg via INTRAVENOUS

## 2022-03-01 MED ORDER — FENTANYL CITRATE (PF) 100 MCG/2ML IJ SOLN
INTRAMUSCULAR | Status: AC
  Filled 2022-03-01: qty 2

## 2022-03-01 MED ORDER — METRONIDAZOLE 500 MG PO TABS: 500.0000 mg | ORAL_TABLET | Freq: Two times a day (BID) | ORAL | 0 refills | Status: AC

## 2022-03-01 MED ORDER — CEPHALEXIN 500 MG PO CAPS: 500.0000 mg | ORAL_CAPSULE | Freq: Two times a day (BID) | ORAL | 0 refills | Status: AC

## 2022-03-01 SURGICAL SUPPLY — 28 items
GAUZE 4X4 16PLY ~~LOC~~+RFID DBL (SPONGE) ×1 IMPLANT
GLOVE BIOGEL PI IND STRL 6.5 (GLOVE) ×1 IMPLANT
GLOVE ECLIPSE 6.0 STRL STRAW (GLOVE) ×1 IMPLANT
GOWN STRL REUS W/TWL LRG LVL3 (GOWN DISPOSABLE) ×1 IMPLANT
KIT TURNOVER CYSTO (KITS) ×1 IMPLANT
LIGASURE IMPACT 36 18CM CVD LR (INSTRUMENTS) ×1 IMPLANT
NDL MAYO 6 CRC TAPER PT (NEEDLE) IMPLANT
NEEDLE MAYO 6 CRC TAPER PT (NEEDLE) IMPLANT
NS IRRIG 1000ML POUR BTL (IV SOLUTION) ×1 IMPLANT
PACK VAGINAL WOMENS (CUSTOM PROCEDURE TRAY) ×1 IMPLANT
PAD OB MATERNITY 4.3X12.25 (PERSONAL CARE ITEMS) ×1 IMPLANT
SET IRRIG Y TYPE TUR BLADDER L (SET/KITS/TRAYS/PACK) ×1 IMPLANT
SPIKE FLUID TRANSFER (MISCELLANEOUS) IMPLANT
SPONGE T-LAP 18X36 ~~LOC~~+RFID STR (SPONGE) ×1 IMPLANT
SUCTION FRAZIER HANDLE 10FR (MISCELLANEOUS) ×1
SUCTION TUBE FRAZIER 10FR DISP (MISCELLANEOUS) IMPLANT
SUT PDS PLUS 0 (SUTURE)
SUT PDS PLUS AB 0 CT-2 (SUTURE) IMPLANT
SUT VIC AB 0 CT1 27 (SUTURE) ×1
SUT VIC AB 0 CT1 27XBRD ANBCTR (SUTURE) IMPLANT
SUT VIC AB 0 CT1 27XCR 8 STRN (SUTURE) ×2 IMPLANT
SUT VIC AB 3-0 SH 27 (SUTURE) ×1
SUT VIC AB 3-0 SH 27XBRD (SUTURE) IMPLANT
SUT VICRYL 2-0 SH 8X27 (SUTURE) IMPLANT
SYR 10ML LL (SYRINGE) IMPLANT
TOWEL OR 17X26 10 PK STRL BLUE (TOWEL DISPOSABLE) ×1 IMPLANT
TRAY FOLEY W/BAG SLVR 14FR LF (SET/KITS/TRAYS/PACK) ×1 IMPLANT
UNDERPAD 30X36 HEAVY ABSORB (UNDERPADS AND DIAPERS) ×1 IMPLANT

## 2022-03-01 NOTE — Anesthesia Preprocedure Evaluation (Addendum)
Anesthesia Evaluation  Patient identified by MRN, date of birth, ID band Patient awake  General Assessment Comment:History noted Dr. Nyoka Cowden  Reviewed: Allergy & Precautions, NPO status   Airway Mallampati: II       Dental   Pulmonary former smoker   breath sounds clear to auscultation       Cardiovascular hypertension,  Rhythm:Regular Rate:Normal     Neuro/Psych    GI/Hepatic Neg liver ROS, hiatal hernia, PUD,GERD  ,,  Endo/Other  History noted Dr. Nyoka Cowden  Renal/GU negative Renal ROS     Musculoskeletal   Abdominal   Peds  Hematology   Anesthesia Other Findings   Reproductive/Obstetrics                             Anesthesia Physical Anesthesia Plan  ASA: 3  Anesthesia Plan: General   Post-op Pain Management:    Induction:   PONV Risk Score and Plan: Ondansetron, Dexamethasone and Midazolam  Airway Management Planned: LMA  Additional Equipment:   Intra-op Plan:   Post-operative Plan: Extubation in OR  Informed Consent: I have reviewed the patients History and Physical, chart, labs and discussed the procedure including the risks, benefits and alternatives for the proposed anesthesia with the patient or authorized representative who has indicated his/her understanding and acceptance.     Dental advisory given  Plan Discussed with: CRNA, Anesthesiologist and Surgeon  Anesthesia Plan Comments:        Anesthesia Quick Evaluation

## 2022-03-01 NOTE — Anesthesia Postprocedure Evaluation (Signed)
Anesthesia Post Note  Patient: Meagan Conway  Procedure(s) Performed: FISTULA REPAIR RECTO-VAGINAL/PERINEORRHAPHY (Vagina )     Patient location during evaluation: PACU Anesthesia Type: General Level of consciousness: awake Pain management: pain level controlled Vital Signs Assessment: post-procedure vital signs reviewed and stable Respiratory status: spontaneous breathing Cardiovascular status: stable Postop Assessment: no apparent nausea or vomiting Anesthetic complications: no   No notable events documented.  Last Vitals:  Vitals:   03/01/22 0930 03/01/22 1002  BP: 139/79 (!) 140/84  Pulse: 65 62  Resp: 13 14  Temp:  36.8 C  SpO2: 94% 96%    Last Pain:  Vitals:   03/01/22 1002  TempSrc: Oral  PainSc:                  Kinley Dozier

## 2022-03-01 NOTE — Op Note (Signed)
Operative Note  Preoperative Diagnosis: rectovaginal fistula  Postoperative Diagnosis: same  Procedures performed:  Exam under anesthesia, Rectovaginal fistula repair, perineorrhaphy  Implants: none  Attending Surgeon: Sherlene Shams, MD  Anesthesia: General LMA  Findings: Pinpoint rectovaginal fistula on the right posterior vaginal, 2 cm from the introitus    Specimens:  ID Type Source Tests Collected by Time Destination  1 : rectovaginal fistula tract Tissue PATH Gyn biopsy SURGICAL PATHOLOGY Jaquita Folds, MD 03/01/2022 [phone]     Estimated blood loss: 25 mL  IV fluids: 750 mL  Urine output: 30 mL  Complications: none  Procedure in Detail:  After informed consent was obtained, the patient was taken to the operating room, where general anesthesia was induced and found to be adequate. She was placed in dorsal lithotomy position, taking care to avoid any nerve injury, and prepped and draped in the usual sterile fashion. A foley catheter was placed. Digital rectal exam was performed and a pinpoint area was noted in the posterior vagina on the right, 2 cm from the introitus. A 50/50 saline and peroxide solution with methylene blue was injected into the rectum with an asepto and dye was noted to come through the pinpoint area previously noted. Rectal exam was again performed and a lacrimal duct probe was inserted easily through the pinpoint area in the vagina and through and touched the finger in the rectum. Two allis clamps were placed beside the fistula tract in the vagina, and the epithelium was injected with 1% lidocaine with epinephrine. An elliptical incision was made with a 15 blade scalpel surrounding the tract. Allis clamps were placed on the mucosal edges and dissection was performed with metzenbaum scissors to undermine the epithelium. Dissection was then performed with scissors around the tract until the rectum was reached. The fistula tract was sent to pathology.  The rectal mucosal edges were grasped with allis clamps and the vaginal tissue was dissected off to ensure no tension on the closure. The rectal mucosal edges were closed with 3-0 vicryl in a running fashion. Rectal exam was performed again to ensure closure of the mucosa. The vaginal tissue was then closed in two interrupted layers with 2-0 vicryl.    The perineum was injected with 1% lidocaine with epinephrine. Two allis clamps were placed a the introitus and a trangle shaped incision was made over the perineum. The excess skin was removed. Dissection was performed with metzenbaum scissors to remove the epithelium from the underlying tissue.  A 0-vicryl suture was then used to reapproximate the perineal body with two interrupted stiches. The vaginal epithelium was then closed with a2-0 vicryl in running fashion. The perineal skin was then closed with a 2-0 vicryl in a running fashion.  Good hemostasis was noted.  The foley catheter was removed. The patient tolerated the procedure well.  She was awakened from anesthesia and transferred to the recovery room in stable condition. All counts were correct x 2.    Jaquita Folds, MD

## 2022-03-01 NOTE — Telephone Encounter (Signed)
Meagan Conway underwent rectovaginal fistula repair, perineorrhaphy on 03/01/22.   She did not have a formal voiding trial but was able to void prior to discharge.   She was discharged without a catheter. Please call her for a routine post op check. Thanks!  Jaquita Folds, MD

## 2022-03-01 NOTE — Anesthesia Procedure Notes (Signed)
Procedure Name: LMA Insertion Date/Time: 03/01/2022 7:43 AM  Performed by: Clearnce Sorrel, CRNAPre-anesthesia Checklist: Patient identified, Emergency Drugs available, Suction available and Patient being monitored Patient Re-evaluated:Patient Re-evaluated prior to induction Oxygen Delivery Method: Circle System Utilized Preoxygenation: Pre-oxygenation with 100% oxygen Induction Type: IV induction Ventilation: Mask ventilation without difficulty LMA: LMA inserted LMA Size: 4.0 Number of attempts: 1 Airway Equipment and Method: Bite block Placement Confirmation: positive ETCO2 Tube secured with: Tape Dental Injury: Teeth and Oropharynx as per pre-operative assessment

## 2022-03-01 NOTE — Transfer of Care (Signed)
Immediate Anesthesia Transfer of Care Note  Patient: [name]  Procedure(s) Performed: FISTULA REPAIR RECTO-VAGINAL/PERINEORRHAPHY (Vagina )  Patient Location: PACU  Anesthesia Type:General  Level of Consciousness: awake, alert , and oriented  Airway & Oxygen Therapy: Patient Spontanous Breathing  Post-op Assessment: Report given to RN and Post -op Vital signs reviewed and stable  Post vital signs: Reviewed and stable  Last Vitals:  Vitals Value Taken Time  BP    Temp    Pulse 71 03/01/22 0906  Resp 16 03/01/22 0906  SpO2 98 % 03/01/22 0906  Vitals shown include unvalidated device data.  Last Pain:  Vitals:   03/01/22 0549  TempSrc: Oral  PainSc: 0-No pain      Patients Stated Pain Goal: 7 (68/12/75 1700)  Complications: No notable events documented.

## 2022-03-01 NOTE — Discharge Instructions (Addendum)
POST OPERATIVE INSTRUCTIONS  General Instructions Recovery (not bed rest) will last approximately 6 weeks Walking is encouraged, but refrain from strenuous exercise/ housework/ heavy lifting. No lifting >10lbs  Nothing in the vagina- NO intercourse, tampons or douching Bathing:  Do not submerge in water (NO swimming, bath, hot tub, etc) until after your postop visit. You can shower starting the day after surgery.  No driving until you are not taking narcotic pain medicine and until your pain is well enough controlled that you can slam on the breaks or make sudden movements if needed.   Taking your medications Please take your acetaminophen and ibuprofen on a schedule for the first 48 hours. Take '600mg'$  ibuprofen, then take '500mg'$  acetaminophen 3 hours later, then continue to alternate ibuprofen and acetaminophen. That way you are taking each type of medication every 6 hours. Take the prescribed narcotic (oxycodone, tramadol, etc) as needed, with a maximum being every 4 hours.  Take a stool softener daily to keep your stools soft and preventing you from straining. If you have diarrhea, you decrease your stool softener. This is explained more below. We have prescribed you Miralax. Two antibiotics have been sent to the pharmacy (Keflex and Flagyl)- please take the complete course for 7 days.   Reasons to Call the Nurse (see last page for phone numbers) Heavy Bleeding (changing your pad every 1-2 hours) Persistent nausea/vomiting Fever (100.4 degrees or more) Incision problems (pus or other fluid coming out, redness, warmth, increased pain)  Things to Expect After Surgery Mild to Moderate pain is normal during the first day or two after surgery. If prescribed, take Ibuprofen or Tylenol first and use the stronger medicine for "break-through" pain. You can overlap these medicines because they work differently.   Constipation   To Prevent Constipation:  Eat a well-balanced diet including protein,  grains, fresh fruit and vegetables.  Drink plenty of fluids. Walk regularly.  Depending on specific instructions from your physician: take Miralax daily and additionally you can add a stool softener (colace/ docusate) and fiber supplement. Continue as long as you're on pain medications.   To Treat Constipation:  If you do not have a bowel movement in 2 days after surgery, you can take 2 Tbs of Milk of Magnesia 1-2 times a day until you have a bowel movement. If diarrhea occurs, decrease the amount or stop the laxative. If no results with Milk of Magnesia, you can drink a bottle of magnesium citrate which you can purchase over the counter.  Fatigue:  This is a normal response to surgery and will improve with time.  Plan frequent rest periods throughout the day.  Gas Pain:  This is very common but can also be very painful! Drink warm liquids such as herbal teas, bouillon or soup. Walking will help you pass more gas.  Mylicon or Gas-X can be taken over the counter.  Leaking Urine:  Varying amounts of leakage may occur after surgery.  This should improve with time. Your bladder needs at least 3 months to recover from surgery. If you leak after surgery, be sure to mention this to your doctor at your post-op visit. If you were taking medications for overactive bladder prior to surgery, be sure to restart the medications immediately after surgery.  Incisions: If you have incisions on your abdomen, the skin glue will dissolve on its own over time. It is ok to gently rinse with soap and water over these incisions but do not scrub.  Catheter Approximately 50% of  patients are unable to urinate after surgery and need to go home with a catheter. This allows your bladder to rest so it can return to full function. If you go home with a catheter, the office will call to set up a voiding trial a few days after surgery. For most patients, by this visit, they are able to urinate on their own. Long term catheter use is  rare.   Return to Work  As work demands and recovery times vary widely, it is hard to predict when you will want to return to work. If you have a desk job with no strenuous physical activity, and if you would like to return sooner than generally recommended, discuss this with your provider or call our office.   Post op concerns  For non-emergent issues, please call the Urogynecology Nurse. Please leave a message and someone will contact you within one business day.  You can also send a message through [place].   AFTER HOURS (After 5:00 PM and on weekends):  For urgent matters that cannot wait until the next business day. Call our office [phone] and connect to the doctor on call.  Please reserve this for important issues.   **FOR ANY TRUE EMERGENCY ISSUES CALL 911 OR GO TO THE NEAREST EMERGENCY ROOM.** Please inform our office or the doctor on call of any emergency.     APPOINTMENTS: Call [phone]        No acetaminophen/Tylenol until after 12:00 pm today if needed.   Post Anesthesia Home Care Instructions  Activity: Get plenty of rest for the remainder of the day. A responsible individual must stay with you for 24 hours following the procedure.  For the next 24 hours, DO NOT: -Drive a car -Paediatric nurse -Drink alcoholic beverages -Take any medication unless instructed by your physician -Make any legal decisions or sign important papers.  Meals: Start with liquid foods such as gelatin or soup. Progress to regular foods as tolerated. Avoid greasy, spicy, heavy foods. If nausea and/or vomiting occur, drink only clear liquids until the nausea and/or vomiting subsides. Call your physician if vomiting continues.  Special Instructions/Symptoms: Your throat may feel dry or sore from the anesthesia or the breathing tube placed in your throat during surgery. If this causes discomfort, gargle with warm salt water. The discomfort should disappear within 24 hours.

## 2022-03-01 NOTE — Interval H&P Note (Signed)
History and Physical Interval Note:  03/01/2022 7:17 AM  Meagan Conway  has presented today for surgery, with the diagnosis of Fistula involving famale genital tract.  The various methods of treatment have been discussed with the patient and family. After consideration of risks, benefits and other options for treatment, the patient has consented to  Procedure(s): FISTULA REPAIR RECTO-VAGINAL (N/A) repair as a surgical intervention.  The patient's history has been reviewed, patient examined, no change in status, stable for surgery.  I have reviewed the patient's chart and labs.  Questions were answered to the patient's satisfaction.     Jaquita Folds

## 2022-03-01 NOTE — Progress Notes (Signed)
Meagan Conway is a 61 y.o. female called and notified of that FMLA paper were received by the [employer] from [place] disability and leave documents from the pt and her husband  Pt notified there will be a 10-14 day turn around for forms to be ready.  Pt notified  she will be contacted as soon as the from are ready and have been faxed.  Pre-op date: 02/17/22 Surgery date: 03/01/22 Post op date:04/13/21   --- Forms were faxed and scanned into chart

## 2022-03-02 ENCOUNTER — Encounter (HOSPITAL_BASED_OUTPATIENT_CLINIC_OR_DEPARTMENT_OTHER): Payer: Self-pay | Admitting: Obstetrics and Gynecology

## 2022-03-02 LAB — SURGICAL PATHOLOGY

## 2022-03-02 NOTE — Telephone Encounter (Signed)
Called and spoke to patient's husband regarding surgical follow up.   He reports she has had a BM and has been urinating without difficulty.  Also reports her bleeding has "slowed down tremendously" She is sore and has been taking her pain medication for breakthrough pain and is currently sleeping.   Will call back in a bit to see if I can speak to patient directly but overall seems to be doing well following surgery.

## 2022-03-07 ENCOUNTER — Other Ambulatory Visit: Payer: Self-pay | Admitting: Gastroenterology

## 2022-03-07 ENCOUNTER — Other Ambulatory Visit: Payer: Self-pay | Admitting: Family Medicine

## 2022-03-09 ENCOUNTER — Other Ambulatory Visit: Payer: Self-pay | Admitting: Gastroenterology

## 2022-03-09 MED ORDER — PANTOPRAZOLE SODIUM 40 MG PO TBEC: 40.0000 mg | DELAYED_RELEASE_TABLET | Freq: Two times a day (BID) | ORAL | 0 refills | Status: DC

## 2022-03-09 NOTE — Telephone Encounter (Signed)
Will route to Dr. Tasia Catchings who filled med last

## 2022-03-10 MED ORDER — VITRON-C 65-125 MG PO TABS: 1.0000 | ORAL_TABLET | Freq: Every day | ORAL | 3 refills | Status: AC

## 2022-03-17 ENCOUNTER — Encounter: Payer: Self-pay | Admitting: Oncology

## 2022-03-17 ENCOUNTER — Ambulatory Visit: Payer: BC Managed Care – PPO | Admitting: Surgery

## 2022-03-17 ENCOUNTER — Inpatient Hospital Stay: Payer: BC Managed Care – PPO | Attending: Oncology

## 2022-03-17 ENCOUNTER — Other Ambulatory Visit: Payer: Self-pay | Admitting: Gastroenterology

## 2022-03-17 DIAGNOSIS — E611 Iron deficiency: Secondary | ICD-10-CM | POA: Insufficient documentation

## 2022-03-17 LAB — CBC WITH DIFFERENTIAL/PLATELET
Abs Immature Granulocytes: 0.06 10*3/uL (ref 0.00–0.07)
Basophils Absolute: 0.1 10*3/uL (ref 0.0–0.1)
Basophils Relative: 1 %
Eosinophils Absolute: 0.3 10*3/uL (ref 0.0–0.5)
Eosinophils Relative: 3 %
HCT: 40.5 % (ref 36.0–46.0)
Hemoglobin: 13.1 g/dL (ref 12.0–15.0)
Immature Granulocytes: 1 %
Lymphocytes Relative: 23 %
Lymphs Abs: 1.9 10*3/uL (ref 0.7–4.0)
MCH: 29.9 pg (ref 26.0–34.0)
MCHC: 32.3 g/dL (ref 30.0–36.0)
MCV: 92.5 fL (ref 80.0–100.0)
Monocytes Absolute: 0.4 10*3/uL (ref 0.1–1.0)
Monocytes Relative: 5 %
Neutro Abs: 5.8 10*3/uL (ref 1.7–7.7)
Neutrophils Relative %: 67 %
Platelets: 379 10*3/uL (ref 150–400)
RBC: 4.38 MIL/uL (ref 3.87–5.11)
RDW: 13.3 % (ref 11.5–15.5)
WBC: 8.6 10*3/uL (ref 4.0–10.5)
nRBC: 0 % (ref 0.0–0.2)

## 2022-03-17 LAB — IRON AND TIBC
Iron: 45 ug/dL (ref 28–170)
Saturation Ratios: 13 % (ref 10.4–31.8)
TIBC: 357 ug/dL (ref 250–450)
UIBC: 312 ug/dL

## 2022-03-17 LAB — FERRITIN: Ferritin: 18 ng/mL (ref 11–307)

## 2022-03-18 ENCOUNTER — Other Ambulatory Visit: Payer: Self-pay | Admitting: Gastroenterology

## 2022-03-18 MED ORDER — PANTOPRAZOLE SODIUM 40 MG PO TBEC: 40.0000 mg | DELAYED_RELEASE_TABLET | Freq: Two times a day (BID) | ORAL | 0 refills | Status: DC

## 2022-03-23 DIAGNOSIS — E669 Obesity, unspecified: Secondary | ICD-10-CM | POA: Diagnosis not present

## 2022-04-09 ENCOUNTER — Ambulatory Visit (INDEPENDENT_AMBULATORY_CARE_PROVIDER_SITE_OTHER): Payer: BC Managed Care – PPO | Admitting: Obstetrics and Gynecology

## 2022-04-09 VITALS — BP 118/83 | HR 92

## 2022-04-09 DIAGNOSIS — T148XXA Other injury of unspecified body region, initial encounter: Secondary | ICD-10-CM

## 2022-04-09 DIAGNOSIS — T148XXD Other injury of unspecified body region, subsequent encounter: Secondary | ICD-10-CM

## 2022-04-09 MED ORDER — ESTRADIOL 0.1 MG/GM VA CREA: 0.5000 g | TOPICAL_CREAM | VAGINAL | 11 refills | Status: DC

## 2022-04-09 NOTE — Progress Notes (Unsigned)
[facility]  Date of Visit: 04/09/2022  History of Present Illness: [name] is a 62 y.o. female scheduled today for a post-operative visit.   Surgery: s/p Exam under anesthesia, Rectovaginal fistula repair, perineorrhaphy on 03/01/22  Postoperative course has been uncomplicated.   Today she reports she has a little bit of vaginal discharge, sometimes yellow or blood tinged. Denies any bowel leaksge through the vagina.   Pain?  A little tender this week, but has not needed any pain medication Bowel issues: No - taking stool softener, does not need to strain.   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   Allergies: Patient is allergic to nsaids.   Physical Exam: BP 118/83   Pulse 92    Pelvic Examination: Perineal incision separated, superficial, small amount of yellow discharge and lbood presentVagina: Incisions {Exam; incision:13523}. Sutures are {DESC; PRESENT/NOT PRESENT:21021351} at incision line and there {ACTION; IS/IS HRC:16384536} granulation tissue. No tenderness along the anterior or posterior vagina. No apical tenderness. No pelvic masses. ***No visible or palpable mesh.  POP-Q: deferred ---------------------------------------------------------  Assessment and Plan: No diagnosis found.  - ***Pathology results were reviewed with the patient today and she verbalized understanding that the results were ***benign.  - ***The patient was given a copy of her operative note ***and pathology report*** for her records. - Can resume regular activity including exercise and intercourse,  if desired.  - Discussed avoidance of heavy lifting and straining long term to  reduce the risk of recurrance.   All questions answered.   No follow-ups on file.

## 2022-04-09 NOTE — Patient Instructions (Signed)
Place estrogen at the vaginal opening/ perineum, a pea sized amount nightly for two weeks then twice a week after that. Continue to restrict activity. Nothing in the vagina. You may return to work.

## 2022-04-11 ENCOUNTER — Encounter: Payer: Self-pay | Admitting: Obstetrics and Gynecology

## 2022-04-12 ENCOUNTER — Ambulatory Visit: Payer: BC Managed Care – PPO | Admitting: Gastroenterology

## 2022-04-13 ENCOUNTER — Encounter: Payer: BC Managed Care – PPO | Admitting: Obstetrics and Gynecology

## 2022-05-03 ENCOUNTER — Encounter: Payer: Self-pay | Admitting: *Deleted

## 2022-05-04 ENCOUNTER — Encounter: Payer: Self-pay | Admitting: Obstetrics and Gynecology

## 2022-05-04 ENCOUNTER — Ambulatory Visit (INDEPENDENT_AMBULATORY_CARE_PROVIDER_SITE_OTHER): Payer: BC Managed Care – PPO | Admitting: Obstetrics and Gynecology

## 2022-05-04 VITALS — BP 126/88 | HR 62

## 2022-05-04 DIAGNOSIS — Z9889 Other specified postprocedural states: Secondary | ICD-10-CM

## 2022-05-04 NOTE — Progress Notes (Signed)
[facility]  Date of Visit: 05/04/2022  History of Present Illness: [name] is a 63 y.o. female scheduled today for a post-operative visit.   Surgery: s/p Exam under anesthesia, Rectovaginal fistula repair, perineorrhaphy on 03/01/22  Last visit noted small separation at perineum. Has been using vaginal estrogen. No longer having discharge or bleeding.   Medications: She has a current medication list which includes the following prescription(s): vitamin d3, estradiol, vitron-c, pantoprazole, fluoxetine, and lisinopril.   Allergies: Patient is allergic to nsaids.   Physical Exam: There were no vitals taken for this visit.   Pelvic Examination: Perineal incision with small separation but healing well. No bleeding or discharge present.  Vagina: Incisions healing well. Sutures are not present in the vagina. No pelvic masses.   POP-Q: deferred ---------------------------------------------------------  Assessment and Plan:  1. Post-operative state     - perineal area with separation of incision but improved from last visit - Continue estrace cream twice a week - can resume activity and intercourse but if she notices any further bleeding, she should return to pelvic rest.   Return 2 months for repeat exam  Jaquita Folds, MD

## 2022-05-15 ENCOUNTER — Ambulatory Visit: Payer: BC Managed Care – PPO

## 2022-06-07 ENCOUNTER — Other Ambulatory Visit: Payer: Self-pay | Admitting: Family Medicine

## 2022-07-02 ENCOUNTER — Ambulatory Visit (INDEPENDENT_AMBULATORY_CARE_PROVIDER_SITE_OTHER): Payer: BC Managed Care – PPO | Admitting: Obstetrics and Gynecology

## 2022-07-02 ENCOUNTER — Encounter: Payer: Self-pay | Admitting: Obstetrics and Gynecology

## 2022-07-02 VITALS — BP 132/84 | HR 62

## 2022-07-02 DIAGNOSIS — Z9889 Other specified postprocedural states: Secondary | ICD-10-CM | POA: Diagnosis not present

## 2022-07-02 NOTE — Progress Notes (Signed)
Meagan Conway  Date of Visit: 07/02/2022  History of Present Illness: [name] is a 62 y.o. female scheduled today for a post-operative visit.   Surgery: s/p Exam under anesthesia, Rectovaginal fistula repair, perineorrhaphy on 03/01/22  Still had small perineal separation last visit. Has been using vaginal estrogen. Has seen a spot or two of blood but no discharge. Denies bowel leakage through the vagina.   Medications: She has a current medication list which includes the following prescription(s): vitamin d3, estradiol, fluoxetine, vitron-c, lisinopril, and pantoprazole.   Allergies: Patient is allergic to nsaids.   Physical Exam: BP 132/84   Pulse 62    Pelvic Examination: Perineal incision well healed. No bleeding or discharge present.  Vagina: Incisions healing well. Sutures are not present in the vagina. No pelvic masses.  Rectal: normal rectal tone, no evidence of fistula  POP-Q: deferred ---------------------------------------------------------  Assessment and Plan:  1. Post-operative state    - Well healed.  - Continue estrace cream twice a week - Ok for all activity  Return as needed  Marguerita Beards, MD  Time spent: I spent 15 minutes dedicated to the care of this patient on the date of this encounter to include pre-visit review of records, face-to-face time with the patient and post visit documentation.

## 2022-09-27 ENCOUNTER — Ambulatory Visit: Payer: BC Managed Care – PPO | Admitting: Family Medicine

## 2022-09-30 ENCOUNTER — Telehealth: Payer: Self-pay | Admitting: Family Medicine

## 2022-09-30 NOTE — Telephone Encounter (Signed)
Prescription Request  09/30/2022  LOV: Visit date not found  What is the name of the medication or equipment? lisinopril (ZESTRIL) 10 MG tablet  Have you contacted your pharmacy to request a refill? No   Which pharmacy would you like this sent to?   [id number] [address] [place] [address] Phone: [phone] Fax: [phone]      Patient notified that their request is being sent to the clinical staff for review and that they should receive a response within 2 business days.   Please advise at Mobile [phone] (mobile)

## 2022-10-01 MED ORDER — LISINOPRIL 10 MG PO TABS: 10.0000 mg | ORAL_TABLET | Freq: Every day | ORAL | 0 refills | Status: DC

## 2022-10-01 NOTE — Addendum Note (Signed)
Addended by: Shon Millet on: 10/01/2022 10:51 AM   Modules accepted: Orders

## 2022-10-05 ENCOUNTER — Telehealth: Payer: Self-pay | Admitting: Family Medicine

## 2022-10-05 DIAGNOSIS — R7303 Prediabetes: Secondary | ICD-10-CM

## 2022-10-05 DIAGNOSIS — I10 Essential (primary) hypertension: Secondary | ICD-10-CM

## 2022-10-05 DIAGNOSIS — Z79899 Other long term (current) drug therapy: Secondary | ICD-10-CM

## 2022-10-05 NOTE — Telephone Encounter (Signed)
-----   Message from Alvina Chou sent at 09/24/2022  3:05 PM EDT ----- Regarding: Lab orders for Wednesday, 7.24.24 Patient is scheduled for CPX labs, please order future labs, Thanks , Camelia Eng

## 2022-10-06 ENCOUNTER — Other Ambulatory Visit: Payer: BC Managed Care – PPO

## 2022-10-06 DIAGNOSIS — Z79899 Other long term (current) drug therapy: Secondary | ICD-10-CM | POA: Diagnosis not present

## 2022-10-06 DIAGNOSIS — I10 Essential (primary) hypertension: Secondary | ICD-10-CM | POA: Diagnosis not present

## 2022-10-06 DIAGNOSIS — R7303 Prediabetes: Secondary | ICD-10-CM

## 2022-10-06 LAB — LIPID PANEL
Cholesterol: 215 mg/dL — ABNORMAL HIGH (ref 0–200)
HDL: 89 mg/dL (ref >39.00)
LDL Cholesterol: 110 mg/dL — ABNORMAL HIGH (ref 0–99)
NonHDL: 125.6
Total CHOL/HDL Ratio: 2
Triglycerides: 80 mg/dL (ref 0.0–149.0)
VLDL: 16 mg/dL (ref 0.0–40.0)

## 2022-10-06 LAB — COMPREHENSIVE METABOLIC PANEL
ALT: 19 U/L (ref 0–35)
AST: 19 U/L (ref 0–37)
Albumin: 4.5 g/dL (ref 3.5–5.2)
Alkaline Phosphatase: 69 U/L (ref 39–117)
BUN: 10 mg/dL (ref 6–23)
CO2: 28 mEq/L (ref 19–32)
Calcium: 9.8 mg/dL (ref 8.4–10.5)
Chloride: 104 mEq/L (ref 96–112)
Creatinine, Ser: 0.9 mg/dL (ref 0.40–1.20)
GFR: 69.9 mL/min (ref >60.00)
Glucose, Bld: 116 mg/dL — ABNORMAL HIGH (ref 70–99)
Potassium: 4.6 mEq/L (ref 3.5–5.1)
Sodium: 142 mEq/L (ref 135–145)
Total Bilirubin: 0.4 mg/dL (ref 0.2–1.2)
Total Protein: 6.7 g/dL (ref 6.0–8.3)

## 2022-10-06 LAB — CBC WITH DIFFERENTIAL/PLATELET
Basophils Absolute: 0.1 10*3/uL (ref 0.0–0.1)
Basophils Relative: 0.9 % (ref 0.0–3.0)
Eosinophils Absolute: 0.2 10*3/uL (ref 0.0–0.7)
Eosinophils Relative: 2.5 % (ref 0.0–5.0)
HCT: 42.5 % (ref 36.0–46.0)
Hemoglobin: 13.7 g/dL (ref 12.0–15.0)
Lymphocytes Relative: 28.6 % (ref 12.0–46.0)
Lymphs Abs: 2 10*3/uL (ref 0.7–4.0)
MCHC: 32.3 g/dL (ref 30.0–36.0)
MCV: 93.5 fl (ref 78.0–100.0)
Monocytes Absolute: 0.4 10*3/uL (ref 0.1–1.0)
Monocytes Relative: 6 % (ref 3.0–12.0)
Neutro Abs: 4.2 10*3/uL (ref 1.4–7.7)
Neutrophils Relative %: 62 % (ref 43.0–77.0)
Platelets: 263 10*3/uL (ref 150.0–400.0)
RBC: 4.55 Mil/uL (ref 3.87–5.11)
RDW: 15.4 % (ref 11.5–15.5)
WBC: 6.8 10*3/uL (ref 4.0–10.5)

## 2022-10-06 LAB — HEMOGLOBIN A1C: Hgb A1c MFr Bld: 5.7 % (ref 4.6–6.5)

## 2022-10-06 LAB — VITAMIN B12: Vitamin B-12: 494 pg/mL (ref 211–911)

## 2022-10-06 LAB — TSH: TSH: 2.03 u[IU]/mL (ref 0.35–5.50)

## 2022-10-06 LAB — VITAMIN D 25 HYDROXY (VIT D DEFICIENCY, FRACTURES): VITD: 30.46 ng/mL (ref 30.00–100.00)

## 2022-10-11 ENCOUNTER — Ambulatory Visit: Payer: BC Managed Care – PPO | Admitting: Family Medicine

## 2022-10-13 ENCOUNTER — Encounter: Payer: BC Managed Care – PPO | Admitting: Family Medicine

## 2022-10-21 ENCOUNTER — Encounter: Payer: BC Managed Care – PPO | Admitting: Family Medicine

## 2022-10-28 ENCOUNTER — Encounter (INDEPENDENT_AMBULATORY_CARE_PROVIDER_SITE_OTHER): Payer: Self-pay

## 2022-11-16 ENCOUNTER — Inpatient Hospital Stay: Payer: BC Managed Care – PPO | Attending: Oncology

## 2022-11-16 DIAGNOSIS — D509 Iron deficiency anemia, unspecified: Secondary | ICD-10-CM | POA: Diagnosis not present

## 2022-11-16 DIAGNOSIS — E611 Iron deficiency: Secondary | ICD-10-CM

## 2022-11-16 LAB — CBC WITH DIFFERENTIAL/PLATELET
Abs Immature Granulocytes: 0.06 10*3/uL (ref 0.00–0.07)
Basophils Absolute: 0.1 10*3/uL (ref 0.0–0.1)
Basophils Relative: 1 %
Eosinophils Absolute: 0.2 10*3/uL (ref 0.0–0.5)
Eosinophils Relative: 2 %
HCT: 41.1 % (ref 36.0–46.0)
Hemoglobin: 13.4 g/dL (ref 12.0–15.0)
Immature Granulocytes: 1 %
Lymphocytes Relative: 21 %
Lymphs Abs: 1.8 10*3/uL (ref 0.7–4.0)
MCH: 30.9 pg (ref 26.0–34.0)
MCHC: 32.6 g/dL (ref 30.0–36.0)
MCV: 94.9 fL (ref 80.0–100.0)
Monocytes Absolute: 0.4 10*3/uL (ref 0.1–1.0)
Monocytes Relative: 5 %
Neutro Abs: 6 10*3/uL (ref 1.7–7.7)
Neutrophils Relative %: 70 %
Platelets: 253 10*3/uL (ref 150–400)
RBC: 4.33 MIL/uL (ref 3.87–5.11)
RDW: 14.3 % (ref 11.5–15.5)
WBC: 8.6 10*3/uL (ref 4.0–10.5)
nRBC: 0 % (ref 0.0–0.2)

## 2022-11-16 LAB — FERRITIN: Ferritin: 16 ng/mL (ref 11–307)

## 2022-11-16 LAB — IRON AND TIBC
Iron: 104 ug/dL (ref 28–170)
Saturation Ratios: 27 % (ref 10.4–31.8)
TIBC: 391 ug/dL (ref 250–450)
UIBC: 287 ug/dL

## 2022-11-19 ENCOUNTER — Encounter: Payer: Self-pay | Admitting: Oncology

## 2022-11-19 ENCOUNTER — Inpatient Hospital Stay (HOSPITAL_BASED_OUTPATIENT_CLINIC_OR_DEPARTMENT_OTHER): Payer: BC Managed Care – PPO | Admitting: Oncology

## 2022-11-19 VITALS — BP 137/93 | HR 55 | Temp 97.9°F | Resp 18 | Wt 235.5 lb

## 2022-11-19 DIAGNOSIS — D500 Iron deficiency anemia secondary to blood loss (chronic): Secondary | ICD-10-CM

## 2022-11-19 DIAGNOSIS — Q2730 Arteriovenous malformation, site unspecified: Secondary | ICD-10-CM

## 2022-11-19 DIAGNOSIS — D509 Iron deficiency anemia, unspecified: Secondary | ICD-10-CM | POA: Diagnosis not present

## 2022-11-19 NOTE — Progress Notes (Signed)
Hematology/Oncology Progress note [phone] [phone]) [phone]   REASON OF VISIT  Iron deficiency anemia.   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 Lab Results  Component Value Date   HGB 13.4 11/16/2022   TIBC 391 11/16/2022   IRONPCTSAT 27 11/16/2022   FERRITIN 16 11/16/2022    Hemoglobin and iron panel are both stable and within normal limits.  Recommend patient to continue Vitron C, decrease frequency to 2-3 time per week.   Patient is discharged from my clinic. I recommend patient to continue follow up with primary care physician and have iron panel annually.  Patient may re-establish care in the future if clinically indicated.   All questions were answered. The patient knows to call the clinic with any problems, questions or concerns. No barriers to learning was detected.  Rickard Patience, MD, PhD [facility] Oncology 11/19/2022    INTERVAL HISTORY: Meagan Conway 62 y.o. female presents for follow up of iron deficiency anemia.  She reports feeling well.  Mild fatigue.  No dark stool or blood in the stool.  No other new complaints.   ALLERGIES:  is allergic to nsaids.  MEDICATIONS:  Current Outpatient Medications  Medication Sig Dispense Refill   Cholecalciferol (VITAMIN D3) 50 MCG (2000 UT) TABS Take 1 tablet by mouth daily.     FLUoxetine (PROZAC) 20 MG capsule Take 1 capsule (20 mg total) by mouth daily. 90 capsule 3   Iron-Vitamin C (VITRON-C) 65-125 MG TABS Take 1 tablet by mouth daily. 90 tablet 3   lisinopril (ZESTRIL) 10 MG tablet Take 1 tablet (10 mg total) by mouth daily. 90 tablet 0   pantoprazole (PROTONIX) 40 MG tablet Take 1 tablet (40 mg total) by mouth 2 (two) times daily before a meal. 30 minutes before breakfast 180 tablet 0   No current facility-administered medications for this visit.     Review of Systems  Constitutional:  Negative for  appetite change, chills, fatigue and fever.  HENT:   Negative for hearing loss and voice change.   Eyes:  Negative for eye problems.  Respiratory:  Negative for chest tightness and cough.   Cardiovascular:  Negative for chest pain.  Gastrointestinal:  Negative for abdominal distention, abdominal pain and blood in stool.  Endocrine: Negative for hot flashes.  Genitourinary:  Negative for difficulty urinating and frequency.   Musculoskeletal:  Negative for arthralgias.  Skin:  Negative for itching and rash.  Neurological:  Negative for extremity weakness.  Hematological:  Negative for adenopathy.  Psychiatric/Behavioral:  Negative for confusion.      PHYSICAL EXAMINATION: ECOG PERFORMANCE STATUS: 0 - Asymptomatic  Vitals:   11/19/22 1131  BP: (!) 137/93  Pulse: (!) 55  Resp: 18  Temp: 97.9 F (36.6 C)  SpO2: 98%   Filed Weights   11/19/22 1131  Weight: 235 lb 8 oz (106.8 kg)    Physical Exam Constitutional:      General: She is not in acute distress. HENT:     Head: Normocephalic and atraumatic.  Eyes:     General: No scleral icterus. Cardiovascular:     Rate and Rhythm: Normal rate.  Pulmonary:     Effort: Pulmonary effort is normal. No respiratory distress.  Abdominal:     General: There is no distension.     Palpations: Abdomen is soft.  Musculoskeletal:        General: Normal range of motion.  Cervical back: Normal range of motion and neck supple.  Skin:    Findings: No rash.  Neurological:     Mental Status: She is alert and oriented to person, place, and time. Mental status is at baseline.     Motor: No abnormal muscle tone.  Psychiatric:        Mood and Affect: Mood and affect normal.      LABORATORY DATA:  I have reviewed the data as listed    Latest Ref Rng & Units 11/16/2022   10:25 AM 10/06/2022    7:39 AM 03/17/2022    9:57 AM  CBC  WBC 4.0 - 10.5 K/uL 8.6  6.8  8.6   Hemoglobin 12.0 - 15.0 g/dL 09.8  11.9  14.7   Hematocrit 36.0 - 46.0 %  41.1  42.5  40.5   Platelets 150 - 400 K/uL 253  263.0  379       Latest Ref Rng & Units 10/06/2022    7:39 AM 03/01/2022    6:01 AM 08/19/2021    7:27 AM  CMP  Glucose 70 - 99 mg/dL 829  562  130   BUN 6 - 23 mg/dL 10  13  14    Creatinine 0.40 - 1.20 mg/dL 8.65  7.84  6.96   Sodium 135 - 145 mEq/L 142  142  143   Potassium 3.5 - 5.1 mEq/L 4.6  4.2  4.8   Chloride 96 - 112 mEq/L 104  104  106   CO2 19 - 32 mEq/L 28   28   Calcium 8.4 - 10.5 mg/dL 9.8   9.2   Total Protein 6.0 - 8.3 g/dL 6.7   6.2   Total Bilirubin 0.2 - 1.2 mg/dL 0.4   0.3   Alkaline Phos 39 - 117 U/L 69   78   AST 0 - 37 U/L 19   14   ALT 0 - 35 U/L 19   17      RADIOGRAPHIC STUDIES: I have personally reviewed the radiological images as listed and agreed with the findings in the report. No results found.

## 2022-11-19 NOTE — Assessment & Plan Note (Addendum)
Iron deficiency with history of AVM Labs are reviewed and discussed with patient. Lab Results  Component Value Date   HGB 13.4 11/16/2022   TIBC 391 11/16/2022   IRONPCTSAT 27 11/16/2022   FERRITIN 16 11/16/2022    Hemoglobin and iron panel are both stable and within normal limits.  Recommend patient to continue Vitron C, decrease frequency to 2-3 time per week.

## 2022-11-24 ENCOUNTER — Encounter: Payer: Self-pay | Admitting: Family Medicine

## 2022-11-24 ENCOUNTER — Ambulatory Visit (INDEPENDENT_AMBULATORY_CARE_PROVIDER_SITE_OTHER): Payer: BC Managed Care – PPO | Admitting: Family Medicine

## 2022-11-24 VITALS — BP 130/82 | HR 65 | Temp 98.0°F | Resp 16 | Ht 64.0 in | Wt 232.4 lb

## 2022-11-24 DIAGNOSIS — I10 Essential (primary) hypertension: Secondary | ICD-10-CM

## 2022-11-24 DIAGNOSIS — Z79899 Other long term (current) drug therapy: Secondary | ICD-10-CM

## 2022-11-24 DIAGNOSIS — Z0000 Encounter for general adult medical examination without abnormal findings: Secondary | ICD-10-CM

## 2022-11-24 DIAGNOSIS — K219 Gastro-esophageal reflux disease without esophagitis: Secondary | ICD-10-CM | POA: Diagnosis not present

## 2022-11-24 DIAGNOSIS — N823 Fistula of vagina to large intestine: Secondary | ICD-10-CM

## 2022-11-24 DIAGNOSIS — Z1211 Encounter for screening for malignant neoplasm of colon: Secondary | ICD-10-CM | POA: Diagnosis not present

## 2022-11-24 DIAGNOSIS — D509 Iron deficiency anemia, unspecified: Secondary | ICD-10-CM

## 2022-11-24 DIAGNOSIS — R7303 Prediabetes: Secondary | ICD-10-CM

## 2022-11-24 DIAGNOSIS — F341 Dysthymic disorder: Secondary | ICD-10-CM

## 2022-11-24 MED ORDER — FLUOXETINE HCL 20 MG PO CAPS: 20.0000 mg | ORAL_CAPSULE | Freq: Every day | ORAL | 3 refills | Status: DC

## 2022-11-24 MED ORDER — LISINOPRIL 10 MG PO TABS: 10.0000 mg | ORAL_TABLET | Freq: Every day | ORAL | 3 refills | Status: DC

## 2022-11-24 MED ORDER — PANTOPRAZOLE SODIUM 40 MG PO TBEC: 40.0000 mg | DELAYED_RELEASE_TABLET | Freq: Every day | ORAL | 3 refills | Status: DC

## 2022-11-24 NOTE — Assessment & Plan Note (Signed)
bp in fair control at this time  BP Readings from Last 1 Encounters:  11/24/22 130/82   No changes needed Most recent labs reviewed  Disc lifstyle change with low sodium diet and exercise  Plan to continue lisinopril 10 mg daily

## 2022-11-24 NOTE — Assessment & Plan Note (Signed)
Lab Results  Component Value Date   HGBA1C 5.7 10/06/2022   disc imp of low glycemic diet and wt loss to prevent DM2  Handout given

## 2022-11-24 NOTE — Assessment & Plan Note (Signed)
Continues oral supplementation and hematology care

## 2022-11-24 NOTE — Assessment & Plan Note (Signed)
Reviewed health habits including diet and exercise and skin cancer prevention Reviewed appropriate screening tests for age  Also reviewed health mt list, fam hx and immunization status , as well as social and family history   See HPI Labs reviewed and ordered Plans flu shot later in season at work  Declines shingrix vaccine  Mammogram 03/2021-plans to get at [facility] imaging after upcoming gyn visit  Pap utd Colonoscopy utd 09/2021  Discussed fall prevention, supplements and exercise for bone density   PHQ of 2 , better back on fluoxetine

## 2022-11-24 NOTE — Assessment & Plan Note (Signed)
Lab Results  Component Value Date   VITAMINB12 494 10/06/2022   Last vitamin D Lab Results  Component Value Date   VD25OH 30.46 10/06/2022

## 2022-11-24 NOTE — Progress Notes (Signed)
Subjective:    Patient ID: Meagan Conway, female    DOB: Jan 27, 1961, 62 y.o.   MRN: [id number]  HPI  Here for health maintenance exam and to review chronic medical problems   Wt Readings from Last 3 Encounters:  11/24/22 232 lb 6 oz (105.4 kg)  11/19/22 235 lb 8 oz (106.8 kg)  03/01/22 235 lb 3.2 oz (106.7 kg)   39.89 kg/m  Vitals:   11/24/22 1514  BP: 130/82  Pulse: 65  Resp: 16  Temp: 98 F (36.7 C)  SpO2: 95%    Immunization History  Administered Date(s) Administered   Influenza Split 02/16/2011   Influenza,inj,Quad PF,6+ Mos 01/07/2015   Influenza-Unspecified 12/14/2019   PPD Test 07/08/2015   Td 11/15/2006   Tdap 01/07/2015    Health Maintenance Due  Topic Date Due   HIV Screening  Never done   MAMMOGRAM  02/27/2021    Declines flu shot  Gets at work   Shingrix - not interested   Mammogram  gets at [facility] 03/2021  Self breast exam- no lumps   Gyn health Sees gyn Has appointment next wk Normal pap with neg HPV 03/2021   Had a rectovaginal fistula repair in December  That went well    Colon cancer screening -colonoscopy 09/2021   Bone health   Falls-none  Cindee Lame  Supplements - 2000 international units D3 daily  Last vitamin D Lab Results  Component Value Date   VD25OH 30.46 10/06/2022    Exercise : none  Would love to go back to water aerobics     Mood    11/24/2022    4:21 PM 08/17/2019    3:46 PM 07/18/2017    4:02 PM  Depression screen PHQ 2/9  Decreased Interest 1 0 1  Down, Depressed, Hopeless 1 0 1  PHQ - 2 Score 2 0 2  Altered sleeping 1 1 1   Tired, decreased energy 0 1 1  Change in appetite 0 0 1  Feeling bad or failure about yourself  1 0 0  Trouble concentrating 1 0 0  Moving slowly or fidgety/restless 0 0 0  Suicidal thoughts 0 0 0  PHQ-9 Score 5 2 5   Difficult doing work/chores Somewhat difficult Not difficult at all    Used to take fluoxetine - came off and went back on it  Feels better  on it   The job is very stressful  Looking forward to retirement  Sedentary job    GERD Protonix 40 mg -once daily /back to daily   Lab Results  Component Value Date   VITAMINB12 494 10/06/2022   HTN bp is stable today  No cp or palpitations or headaches or edema  No side effects to medicines  BP Readings from Last 3 Encounters:  11/24/22 130/82  11/19/22 (!) 137/93  07/02/22 132/84    Lisinopril 10 mg daily   Lab Results  Component Value Date   NA 142 10/06/2022   K 4.6 10/06/2022   CO2 28 10/06/2022   GLUCOSE 116 (H) 10/06/2022   BUN 10 10/06/2022   CREATININE 0.90 10/06/2022   CALCIUM 9.8 10/06/2022   GFR 69.90 10/06/2022   GFRNONAA >60 06/09/2016   Lab Results  Component Value Date   ALT 19 10/06/2022   AST 19 10/06/2022   ALKPHOS 69 10/06/2022   BILITOT 0.4 10/06/2022   Lab Results  Component Value Date   WBC 8.6 11/16/2022   HGB 13.4 11/16/2022   HCT 41.1  11/16/2022   MCV 94.9 11/16/2022   PLT 253 11/16/2022   Lab Results  Component Value Date   IRON 104 11/16/2022   TIBC 391 11/16/2022   FERRITIN 16 11/16/2022   Sees hematology for anemia from [facility] Takes iron 2-3 times per week   Lab Results  Component Value Date   TSH 2.03 10/06/2022   Glucose Lab Results  Component Value Date   HGBA1C 5.7 10/06/2022   Occational glass of wine  Occational sweets  Not a lot of bread or past  No sweetened drinks   Cholesterol Lab Results  Component Value Date   CHOL 215 (H) 10/06/2022   CHOL 184 08/19/2021   CHOL 214 (H) 08/17/2019   Lab Results  Component Value Date   HDL 89.00 10/06/2022   HDL 77.10 08/19/2021   HDL 83 08/17/2019   Lab Results  Component Value Date   LDLCALC 110 (H) 10/06/2022   LDLCALC 93 08/19/2021   LDLCALC 111 (H) 08/17/2019   Lab Results  Component Value Date   TRIG 80.0 10/06/2022   TRIG 68.0 08/19/2021   TRIG 94 08/17/2019   Lab Results  Component Value Date   CHOLHDL 2 10/06/2022   CHOLHDL 2  08/19/2021   CHOLHDL 2.6 08/17/2019   No results found for: "LDLDIRECT"    Patient Active Problem List   Diagnosis Date Noted   Paraesophageal hiatal hernia    Current use of proton pump inhibitor 08/25/2020   IDA (iron deficiency anemia) 10/03/2018   Special screening for malignant neoplasms, colon    Ulceration of intestine    Iron deficiency anemia 12/29/2014   Colon cancer screening 07/08/2014   Prediabetes 07/04/2013   Routine general medical examination at a health care facility 06/04/2011   GERD (gastroesophageal reflux disease) 03/10/2011   Morbid obesity (HCC) 03/10/2011   COLONIC POLYPS 04/22/2010   ANXIETY DEPRESSION 02/28/2008   Hypertension 01/15/2008   Past Medical History:  Diagnosis Date   Depression    with anxious features   GERD (gastroesophageal reflux disease)    History of gastrointestinal ulcer    followed by dr Allegra Lai; 09-18-2021 last colonoscopy w/ intestinal ulcer with ileitis / enteritis due to nsaid use  (bc/ goody powders   Hypertension 05/2005   H/O. no meds currently   IDA (iron deficiency anemia) 10/03/2018   hematologist--- dr Herma Carson. Cathie Hoops ([place])   Obesity    Paraesophageal hiatal hernia 2023   followed by dr Allegra Lai (GI);  last egd in epic 12-25-2021  large type III   Rectal vaginal fistula 1994   hx s/p reair 1999 after vaginal delivery , failed with continued chronic non-healing   Vertigo    last episode 03/18   Past Surgical History:  Procedure Laterality Date   COLONOSCOPY N/A 09/18/2021   Procedure: COLONOSCOPY;  Surgeon: Midge Minium, MD;  Location: [facility];  Service: Endoscopy;  Laterality: N/A;   COLONOSCOPY WITH PROPOFOL N/A 08/27/2016   Procedure: COLONOSCOPY WITH PROPOFOL;  Surgeon: Midge Minium, MD;  Location: [facility];  Service: Endoscopy;  Laterality: N/A;   ESOPHAGOGASTRODUODENOSCOPY (EGD) WITH PROPOFOL N/A 08/29/2017   Procedure: ESOPHAGOGASTRODUODENOSCOPY (EGD) WITH PROPOFOL;  Surgeon: Midge Minium,  MD;  Location: [facility];  Service: Endoscopy;  Laterality: N/A;  pt request early arrival time   ESOPHAGOGASTRODUODENOSCOPY (EGD) WITH PROPOFOL N/A 12/25/2021   Procedure: ESOPHAGOGASTRODUODENOSCOPY (EGD) WITH PROPOFOL;  Surgeon: Toney Reil, MD;  Location: [facility];  Service: Gastroenterology;  Laterality: N/A;   GIVENS CAPSULE  STUDY N/A 09/28/2017   Procedure: GIVENS CAPSULE STUDY;  Surgeon: Midge Minium, MD;  Location: [facility] END[facility]COPY;  Service: Endoscopy;  Laterality: N/A;   HYSTEROSCOPY W/ ENDOMETRIAL ABLATION  2010   OVARIAN CYST SURGERY  08/14/2002   RECTO-VAGINAL FISSURE REPAIR  1999   SMALL BOWEL ENTEROSCOPY  12/15/2017   [facility]   VESICO-VAGINAL FISTULA REPAIR N/A 03/01/2022   Procedure: FISTULA REPAIR RECTO-VAGINAL/PERINEORRHAPHY;  Surgeon: Marguerita Beards, MD;  Location: [facility];  Service: Gynecology;  Laterality: N/A;   Social History   Tobacco Use   Smoking status: Former    Current packs/day: 0.00    Types: Cigarettes    Quit date: 2014    Years since quitting: 10.7   Smokeless tobacco: Never  Vaping Use   Vaping status: Never Used  Substance Use Topics   Alcohol use: Not Currently    Alcohol/week: 3.0 standard drinks of alcohol    Types: 3 Glasses of wine per week   Drug use: No   Family History  Problem Relation Age of Onset   Hypertension Mother        died of GI bleed-unexpected    Hypertension Father    Heart disease Father 61       MI   Alcohol abuse Father    Heart disease Maternal Grandmother    Heart disease Paternal Grandmother    Allergies  Allergen Reactions   Nsaids     Per pt avoid due to intestinal ulcers   Current Outpatient Medications on File Prior to Visit  Medication Sig Dispense Refill   Cholecalciferol (VITAMIN D3) 50 MCG (2000 UT) TABS Take 1 tablet by mouth daily.     Iron-Vitamin C (VITRON-C) 65-125 MG TABS Take 1 tablet by mouth daily. (Patient taking differently: Take 1 tablet  by mouth every other day.) 90 tablet 3   No current facility-administered medications on file prior to visit.    Review of Systems  Constitutional:  Negative for activity change, appetite change, fatigue, fever and unexpected weight change.  HENT:  Negative for congestion, ear pain, rhinorrhea, sinus pressure and sore throat.   Eyes:  Negative for pain, redness and visual disturbance.  Respiratory:  Negative for cough, shortness of breath and wheezing.   Cardiovascular:  Negative for chest pain and palpitations.  Gastrointestinal:  Negative for abdominal pain, blood in stool, constipation and diarrhea.  Endocrine: Negative for polydipsia and polyuria.  Genitourinary:  Negative for dysuria, frequency and urgency.  Musculoskeletal:  Negative for arthralgias, back pain and myalgias.  Skin:  Negative for pallor and rash.  Allergic/Immunologic: Negative for environmental allergies.  Neurological:  Negative for dizziness, syncope and headaches.  Hematological:  Negative for adenopathy. Does not bruise/bleed easily.  Psychiatric/Behavioral:  Negative for decreased concentration and dysphoric mood. The patient is not nervous/anxious.        High stress        Objective:   Physical Exam Constitutional:      General: She is not in acute distress.    Appearance: Normal appearance. She is well-developed. She is obese. She is not ill-appearing or diaphoretic.  HENT:     Head: Normocephalic and atraumatic.     Right Ear: Tympanic membrane, ear canal and external ear normal.     Left Ear: Tympanic membrane, ear canal and external ear normal.     Nose: Nose normal. No congestion.     Mouth/Throat:     Mouth: Mucous membranes are moist.  Pharynx: Oropharynx is clear. No posterior oropharyngeal erythema.  Eyes:     General: No scleral icterus.    Extraocular Movements: Extraocular movements intact.     Conjunctiva/sclera: Conjunctivae normal.     Pupils: Pupils are equal, round, and  reactive to light.  Neck:     Thyroid: No thyromegaly.     Vascular: No carotid bruit or JVD.  Cardiovascular:     Rate and Rhythm: Normal rate and regular rhythm.     Pulses: Normal pulses.     Heart sounds: Normal heart sounds.     No gallop.  Pulmonary:     Effort: Pulmonary effort is normal. No respiratory distress.     Breath sounds: Normal breath sounds. No wheezing.     Comments: Good air exch Chest:     Chest wall: No tenderness.  Abdominal:     General: Bowel sounds are normal. There is no distension or abdominal bruit.     Palpations: Abdomen is soft. There is no mass.     Tenderness: There is no abdominal tenderness.     Hernia: No hernia is present.  Genitourinary:    Comments: Breast and pelvic exam are done by gyn provider   Musculoskeletal:        General: No tenderness. Normal range of motion.     Cervical back: Normal range of motion and neck supple. No rigidity. No muscular tenderness.     Right lower leg: No edema.     Left lower leg: No edema.     Comments: No kyphosis   Lymphadenopathy:     Cervical: No cervical adenopathy.  Skin:    General: Skin is warm and dry.     Coloration: Skin is not pale.     Findings: No erythema or rash.     Comments: Solar lentigines diffusely Some skin tags   Neurological:     Mental Status: She is alert. Mental status is at baseline.     Cranial Nerves: No cranial nerve deficit.     Motor: No abnormal muscle tone.     Coordination: Coordination normal.     Gait: Gait normal.     Deep Tendon Reflexes: Reflexes are normal and symmetric. Reflexes normal.  Psychiatric:        Mood and Affect: Mood normal.        Cognition and Memory: Cognition and memory normal.           Assessment & Plan:   Problem List Items Addressed This Visit       Cardiovascular and Mediastinum   Hypertension    bp in fair control at this time  BP Readings from Last 1 Encounters:  11/24/22 130/82   No changes needed Most recent  labs reviewed  Disc lifstyle change with low sodium diet and exercise  Plan to continue lisinopril 10 mg daily      Relevant Medications   lisinopril (ZESTRIL) 10 MG tablet     Digestive   GERD (gastroesophageal reflux disease)    Continues protonix Taking daily instead of bid  Discussed long term side effects  Would like to wean in future if symptoms stabilize  No longer sees gi       Relevant Medications   pantoprazole (PROTONIX) 40 MG tablet   RESOLVED: Rectovaginal fistula    Repaired this year        Other   Iron deficiency anemia (Chronic)    Continues oral supplementation and hematology care  ANXIETY DEPRESSION    Back on fluoxetine and doing much better  Job stress noted Would like to retire or slow down      Relevant Medications   FLUoxetine (PROZAC) 20 MG capsule   Colon cancer screening    Colonoscopy 09/2021       Current use of proton pump inhibitor    Lab Results  Component Value Date   VITAMINB12 494 10/06/2022   Last vitamin D Lab Results  Component Value Date   VD25OH 30.46 10/06/2022         Morbid obesity (HCC)    Discussed how this problem influences overall health and the risks it imposes  Reviewed plan for weight loss with lower calorie diet (via better food choices (lower glycemic and portion control) along with exercise building up to or more than 30 minutes 5 days per week including some aerobic activity and strength training         Prediabetes    Lab Results  Component Value Date   HGBA1C 5.7 10/06/2022   disc imp of low glycemic diet and wt loss to prevent DM2  Handout given      Routine general medical examination at a health care facility - Primary    Reviewed health habits including diet and exercise and skin cancer prevention Reviewed appropriate screening tests for age  Also reviewed health mt list, fam hx and immunization status , as well as social and family history   See HPI Labs reviewed and  ordered Plans flu shot later in season at work  Declines shingrix vaccine  Mammogram 03/2021-plans to get at [facility] after upcoming gyn visit  Pap utd Colonoscopy utd 09/2021  Discussed fall prevention, supplements and exercise for bone density   PHQ of 2 , better back on fluoxetine

## 2022-11-24 NOTE — Assessment & Plan Note (Signed)
Continues protonix Taking daily instead of bid  Discussed long term side effects  Would like to wean in future if symptoms stabilize  No longer sees gi

## 2022-11-24 NOTE — Assessment & Plan Note (Signed)
 Discussed how this problem influences overall health and the risks it imposes  Reviewed plan for weight loss with lower calorie diet (via better food choices (lower glycemic and portion control) along with exercise building up to or more than 30 minutes 5 days per week including some aerobic activity and strength training

## 2022-11-24 NOTE — Assessment & Plan Note (Signed)
Repaired this year

## 2022-11-24 NOTE — Assessment & Plan Note (Signed)
Back on fluoxetine and doing much better  Job stress noted Would like to retire or slow down

## 2022-11-24 NOTE — Assessment & Plan Note (Signed)
Colonoscopy 09/2021

## 2022-11-24 NOTE — Patient Instructions (Addendum)
See gyn and get your mammogram as planned    Think about getting back to water exercise  Add some strength training to your routine, this is important for bone and brain health and can reduce your risk of falls and help your body use insulin properly and regulate weight  Light weights, exercise bands , and internet videos are a good way to start  Yoga (chair or regular), machines , floor exercises or a gym with machines are also good options     For prediabetes Lab Results  Component Value Date   HGBA1C 5.7 10/06/2022  Try to get most of your carbohydrates from produce (with the exception of white potatoes) and whole grains Eat less bread/pasta/rice/snack foods/cereals/sweets and other items from the middle of the grocery store (processed carbs)       Mediterranean Diet  Why follow it? Research shows. Those who follow the Mediterranean diet have a reduced risk of heart disease  The diet is associated with a reduced incidence of Parkinson's and Alzheimer's diseases People following the diet may have longer life expectancies and lower rates of chronic diseases  The Dietary Guidelines for Americans recommends the Mediterranean diet as an eating plan to promote health and prevent disease  What Is the Mediterranean Diet?  Healthy eating plan based on typical foods and recipes of Mediterranean-style cooking The diet is primarily a plant based diet; these foods should make up a majority of meals   Starches - Plant based foods should make up a majority of meals - They are an important sources of vitamins, minerals, energy, antioxidants, and fiber - Choose whole grains, foods high in fiber and minimally processed items  - Typical grain sources include wheat, oats, barley, corn, brown rice, bulgar, farro, millet, polenta, couscous  - Various types of beans include chickpeas, lentils, fava beans, black beans, white beans   Fruits  Veggies - Large quantities of antioxidant rich fruits &  veggies; 6 or more servings  - Vegetables can be eaten raw or lightly drizzled with oil and cooked  - Vegetables common to the traditional Mediterranean Diet include: artichokes, arugula, beets, broccoli, brussel sprouts, cabbage, carrots, celery, collard greens, cucumbers, eggplant, kale, leeks, lemons, lettuce, mushrooms, okra, onions, peas, peppers, potatoes, pumpkin, radishes, rutabaga, shallots, spinach, sweet potatoes, turnips, zucchini - Fruits common to the Mediterranean Diet include: apples, apricots, avocados, cherries, clementines, dates, figs, grapefruits, grapes, melons, nectarines, oranges, peaches, pears, pomegranates, strawberries, tangerines  Fats - Replace butter and margarine with healthy oils, such as olive oil, canola oil, and tahini  - Limit nuts to no more than a handful a day  - Nuts include walnuts, almonds, pecans, pistachios, pine nuts  - Limit or avoid candied, honey roasted or heavily salted nuts - Olives are central to the [employer] - can be eaten whole or used in a variety of dishes   Meats Protein - Limiting red meat: no more than a few times a month - When eating red meat: choose lean cuts and keep the portion to the size of deck of cards - Eggs: approx. 0 to 4 times a week  - Fish and lean poultry: at least 2 a week  - Healthy protein sources include, chicken, [place], lean beef, lamb - Increase intake of seafood such as tuna, salmon, trout, mackerel, shrimp, scallops - Avoid or limit high fat processed meats such as sausage and bacon  Dairy - Include moderate amounts of low fat dairy products  - Focus on healthy dairy  such as fat free yogurt, skim milk, low or reduced fat cheese - Limit dairy products higher in fat such as whole or 2% milk, cheese, ice cream  Alcohol - Moderate amounts of red wine is ok  - No more than 5 oz daily for women (all ages) and men older than age 46  - No more than 10 oz of wine daily for men younger than 14  Other - Limit  sweets and other desserts  - Use herbs and spices instead of salt to flavor foods  - Herbs and spices common to the traditional Mediterranean Diet include: basil, bay leaves, chives, cloves, cumin, fennel, garlic, lavender, marjoram, mint, oregano, parsley, pepper, rosemary, sage, savory, sumac, tarragon, thyme   It's not just a diet, it's a lifestyle:  The Mediterranean diet includes lifestyle factors typical of those in the region  Foods, drinks and meals are best eaten with others and savored Daily physical activity is important for overall good health This could be strenuous exercise like running and aerobics This could also be more leisurely activities such as walking, housework, yard-work, or taking the stairs Moderation is the key; a balanced and healthy diet accommodates most foods and drinks Consider portion sizes and frequency of consumption of certain foods   Meal Ideas & Options:  Breakfast:  Whole wheat toast or whole wheat English muffins with peanut butter & hard boiled egg Steel cut oats topped with apples & cinnamon and skim milk  Fresh fruit: banana, strawberries, melon, berries, peaches  Smoothies: strawberries, bananas, greek yogurt, peanut butter Low fat greek yogurt with blueberries and granola  Egg white omelet with spinach and mushrooms Breakfast couscous: whole wheat couscous, apricots, skim milk, cranberries  Sandwiches:  Hummus and grilled vegetables (peppers, zucchini, squash) on whole wheat bread   Grilled chicken on whole wheat pita with lettuce, tomatoes, cucumbers or tzatziki  [place] salad on whole wheat bread: tuna salad made with greek yogurt, olives, red peppers, capers, green onions Garlic rosemary lamb pita: lamb sauted with garlic, rosemary, salt & pepper; add lettuce, cucumber, greek yogurt to pita - flavor with lemon juice and black pepper  Seafood:  Mediterranean grilled salmon, seasoned with garlic, basil, parsley, lemon juice and black  pepper Shrimp, lemon, and spinach whole-grain pasta salad made with low fat greek yogurt  Seared scallops with lemon orzo  Seared tuna steaks seasoned salt, pepper, coriander topped with tomato mixture of olives, tomatoes, olive oil, minced garlic, parsley, green onions and cappers  Meats:  Herbed greek chicken salad with kalamata olives, cucumber, feta  Red bell peppers stuffed with spinach, bulgur, lean ground beef (or lentils) & topped with feta   Kebabs: skewers of chicken, tomatoes, onions, zucchini, squash  [place] burgers: made with red onions, mint, dill, lemon juice, feta cheese topped with roasted red peppers Vegetarian Cucumber salad: cucumbers, artichoke hearts, celery, red onion, feta cheese, tossed in olive oil & lemon juice  Hummus and whole grain pita points with a greek salad (lettuce, tomato, feta, olives, cucumbers, red onion) Lentil soup with celery, carrots made with vegetable broth, garlic, salt and pepper  Tabouli salad: parsley, bulgur, mint, scallions, cucumbers, tomato, radishes, lemon juice, olive oil, salt and pepper.

## 2022-12-09 ENCOUNTER — Ambulatory Visit: Payer: BC Managed Care – PPO | Admitting: Obstetrics & Gynecology

## 2023-01-17 ENCOUNTER — Encounter: Payer: BC Managed Care – PPO | Admitting: Obstetrics & Gynecology

## 2023-01-18 ENCOUNTER — Encounter: Payer: BC Managed Care – PPO | Admitting: Obstetrics & Gynecology

## 2023-03-01 ENCOUNTER — Ambulatory Visit: Payer: BC Managed Care – PPO | Admitting: Obstetrics & Gynecology

## 2023-03-17 ENCOUNTER — Ambulatory Visit
Admission: RE | Admit: 2023-03-17 | Discharge: 2023-03-17 | Disposition: A | Discharge status: Home / Self Care | Payer: BC Managed Care – PPO | Source: Ambulatory Visit | Attending: Family Medicine | Admitting: Family Medicine

## 2023-03-17 VITALS — BP 136/88 | HR 72 | Temp 98.3°F | Resp 16

## 2023-03-17 DIAGNOSIS — M25511 Pain in right shoulder: Secondary | ICD-10-CM

## 2023-03-17 NOTE — ED Provider Notes (Signed)
 [name]    CSN: [id number] Arrival date & time: 03/17/23  1501      History   Chief Complaint Chief Complaint  Patient presents with   Arm Injury    Pain in right arm below shoulder - Entered by patient    HPI Meagan Conway is a 63 y.o. female.  Patient presents with 2-3 week history of right upper arm and shoulder pain.  No falls or injury.  The pain is worse with lifting her arm and movement.  No numbness, weakness, paresthesias, rash, wounds, bruising, fever.  No history of problems with this arm or shoulder.  Treatment attempted with Tylenol .  The history is provided by the patient and medical records.    Past Medical History:  Diagnosis Date   Depression    with anxious features   GERD (gastroesophageal reflux disease)    History of gastrointestinal ulcer    followed by dr unk; 09-18-2021 last colonoscopy w/ intestinal ulcer with ileitis / enteritis due to nsaid use  (bc/ goody powders   Hypertension 05/2005   H/O. no meds currently   IDA (iron  deficiency anemia) 10/03/2018   hematologist--- dr jeneane. babara ([place])   Obesity    Paraesophageal hiatal hernia 2023   followed by dr unk (GI);  last egd in epic 12-25-2021  large type III   Rectal vaginal fistula 1994   hx s/p reair 1999 after vaginal delivery , failed with continued chronic non-healing   Vertigo    last episode 03/18    Patient Active Problem List   Diagnosis Date Noted   Paraesophageal hiatal hernia    Current use of proton pump inhibitor 08/25/2020   IDA (iron  deficiency anemia) 10/03/2018   Special screening for malignant neoplasms, colon    Ulceration of intestine    Iron  deficiency anemia 12/29/2014   Colon cancer screening 07/08/2014   Prediabetes 07/04/2013   Routine general medical examination at a health care facility 06/04/2011   GERD (gastroesophageal reflux disease) 03/10/2011   Morbid obesity (HCC) 03/10/2011   COLONIC POLYPS 04/22/2010   ANXIETY DEPRESSION  02/28/2008   Hypertension 01/15/2008    Past Surgical History:  Procedure Laterality Date   COLONOSCOPY N/A 09/18/2021   Procedure: COLONOSCOPY;  Surgeon: Jinny Carmine, MD;  Location: [facility];  Service: Endoscopy;  Laterality: N/A;   COLONOSCOPY WITH PROPOFOL  N/A 08/27/2016   Procedure: COLONOSCOPY WITH PROPOFOL ;  Surgeon: Jinny Carmine, MD;  Location: [facility];  Service: Endoscopy;  Laterality: N/A;   ESOPHAGOGASTRODUODENOSCOPY (EGD) WITH PROPOFOL  N/A 08/29/2017   Procedure: ESOPHAGOGASTRODUODENOSCOPY (EGD) WITH PROPOFOL ;  Surgeon: Jinny Carmine, MD;  Location: [facility];  Service: Endoscopy;  Laterality: N/A;  pt request early arrival time   ESOPHAGOGASTRODUODENOSCOPY (EGD) WITH PROPOFOL  N/A 12/25/2021   Procedure: ESOPHAGOGASTRODUODENOSCOPY (EGD) WITH PROPOFOL ;  Surgeon: Unk Corinn Skiff, MD;  Location: [facility] ENDOSCOPY;  Service: Gastroenterology;  Laterality: N/A;   GIVENS CAPSULE STUDY N/A 09/28/2017   Procedure: GIVENS CAPSULE STUDY;  Surgeon: Jinny Carmine, MD;  Location: [facility] ENDOSCOPY;  Service: Endoscopy;  Laterality: N/A;   HYSTEROSCOPY W/ ENDOMETRIAL ABLATION  2010   OVARIAN CYST SURGERY  08/14/2002   RECTO-VAGINAL FISSURE REPAIR  1999   SMALL BOWEL ENTEROSCOPY  12/15/2017   [facility]   VESICO-VAGINAL FISTULA REPAIR N/A 03/01/2022   Procedure: FISTULA REPAIR RECTO-VAGINAL/PERINEORRHAPHY;  Surgeon: Marilynne Rosaline SAILOR, MD;  Location: [facility];  Service: Gynecology;  Laterality: N/A;    OB History     Gravida  2   Para  2   Term  2   Preterm      AB      Living  2      SAB      IAB      Ectopic      Multiple      Live Births  2            Home Medications    Prior to Admission medications   Medication Sig Start Date End Date Taking? Authorizing Provider  Cholecalciferol (VITAMIN D3) 50 MCG (2000 UT) TABS Take 1 tablet by mouth daily.    [provider]  FLUoxetine  (PROZAC ) 20 MG capsule  Take 1 capsule (20 mg total) by mouth daily. 11/24/22   Tower, Laine LABOR, MD  Iron -Vitamin C (VITRON-C) 65-125 MG TABS Take 1 tablet by mouth daily. Patient taking differently: Take 1 tablet by mouth every other day. 03/10/22   Babara Call, MD  lisinopril  (ZESTRIL ) 10 MG tablet Take 1 tablet (10 mg total) by mouth daily. 11/24/22   Tower, Laine LABOR, MD  pantoprazole  (PROTONIX ) 40 MG tablet Take 1 tablet (40 mg total) by mouth daily. 30 minutes before breakfast 11/24/22   Tower, Laine LABOR, MD    Family History Family History  Problem Relation Age of Onset   Hypertension Mother        died of GI bleed-unexpected    Hypertension Father    Heart disease Father 24       MI   Alcohol abuse Father    Heart disease Maternal Grandmother    Heart disease Paternal Grandmother     Social History Social History   Tobacco Use   Smoking status: Former    Current packs/day: 0.00    Types: Cigarettes    Quit date: 2014    Years since quitting: 11.0   Smokeless tobacco: Never  Vaping Use   Vaping status: Never Used  Substance Use Topics   Alcohol use: Not Currently    Alcohol/week: 3.0 standard drinks of alcohol    Types: 3 Glasses of wine per week   Drug use: No     Allergies   Nsaids   Review of Systems Review of Systems  Constitutional:  Negative for chills and fever.  Musculoskeletal:  Positive for arthralgias. Negative for joint swelling.  Skin:  Negative for color change, rash and wound.  Neurological:  Negative for weakness and numbness.     Physical Exam Triage Vital Signs ED Triage Vitals  Encounter Vitals Group     BP      Systolic BP Percentile      Diastolic BP Percentile      Pulse      Resp      Temp      Temp src      SpO2      Weight      Height      Head Circumference      Peak Flow      Pain Score      Pain Loc      Pain Education      Exclude from Growth Chart    No data found.  Updated Vital Signs BP 136/88   Pulse 72   Temp 98.3 F (36.8 C)    Resp 16   SpO2 98%   Visual Acuity Right Eye Distance:   Left Eye Distance:   Bilateral Distance:    Right Eye Near:  Left Eye Near:    Bilateral Near:     Physical Exam Constitutional:      General: She is not in acute distress. HENT:     Mouth/Throat:     Mouth: Mucous membranes are moist.  Cardiovascular:     Rate and Rhythm: Normal rate and regular rhythm.  Pulmonary:     Effort: Pulmonary effort is normal. No respiratory distress.  Musculoskeletal:        General: No swelling, tenderness or deformity.     Comments: Patient has discomfort with range of motion but is able to move her right shoulder actively through FROM.  Her discomfort is worse with abduction and rotation.  Sensation intact, strength 5/5, 2+ radial pulse.  Skin:    General: Skin is warm and dry.     Capillary Refill: Capillary refill takes less than 2 seconds.     Findings: No bruising, erythema, lesion or rash.  Neurological:     General: No focal deficit present.     Mental Status: She is alert and oriented to person, place, and time.     Sensory: No sensory deficit.     Motor: No weakness.      UC Treatments / Results  Labs (all labs ordered are listed, but only abnormal results are displayed) Labs Reviewed - No data to display  EKG   Radiology No results found.  Procedures Procedures (including critical care time)  Medications Ordered in UC Medications - No data to display  Initial Impression / Assessment and Plan / UC Course  I have reviewed the triage vital signs and the nursing notes.  Pertinent labs & imaging results that were available during my care of the patient were reviewed by me and considered in my medical decision making (see chart for details).    Right shoulder pain.  No falls or injury.  Discussed follow up with an orthopedist.  Patient declines x-ray today as the orthopedist will likely repeat this.  She is unable to take NSAIDs due to history of GI ulcer.  She  declines prednisone  at this time.  Contact information for on-call ortho Dr. Earnestine Blanch given.  She states she will call Dr. Anthony office to schedule an appointment for evaluation of her shoulder pain.  She states Dr. Blanch has taken care of her mother in the past and they were very pleased with the experience.  Education provided on shoulder pain.  Patient agrees to plan of care.  Final Clinical Impressions(s) / UC Diagnoses   Final diagnoses:  Acute pain of right shoulder     Discharge Instructions      Follow up with an orthopedist such as the one listed below.      ED Prescriptions   None    I have reviewed the PDMP during this encounter.   Corlis Burnard DEL, NP 03/17/23 [phone]

## 2023-03-17 NOTE — Discharge Instructions (Addendum)
Follow up with an orthopedist such as the one listed below.

## 2023-03-17 NOTE — ED Triage Notes (Signed)
 Patient to Urgent Care with complaints of right sided upper arm pain. Reports constant, dull pain. Worse when she lifts her arm. Denies any known injury/ no recent strenuous activity.   Symptoms started 2-3 weeks. 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 Denies any hx of injury.  Meds: taking tylenol .

## 2023-03-21 DIAGNOSIS — M79601 Pain in right arm: Secondary | ICD-10-CM | POA: Diagnosis not present

## 2023-03-31 ENCOUNTER — Other Ambulatory Visit (HOSPITAL_COMMUNITY)
Admission: RE | Admit: 2023-03-31 | Discharge: 2023-03-31 | Disposition: A | Discharge status: Home / Self Care | Payer: BC Managed Care – PPO | Source: Ambulatory Visit | Attending: Obstetrics & Gynecology | Admitting: Obstetrics & Gynecology

## 2023-03-31 ENCOUNTER — Encounter: Payer: Self-pay | Admitting: Obstetrics & Gynecology

## 2023-03-31 ENCOUNTER — Ambulatory Visit: Payer: BC Managed Care – PPO | Admitting: Obstetrics & Gynecology

## 2023-03-31 VITALS — BP 130/86 | HR 63 | Ht 64.0 in | Wt 248.0 lb

## 2023-03-31 DIAGNOSIS — Z1339 Encounter for screening examination for other mental health and behavioral disorders: Secondary | ICD-10-CM | POA: Diagnosis not present

## 2023-03-31 DIAGNOSIS — Z01419 Encounter for gynecological examination (general) (routine) without abnormal findings: Secondary | ICD-10-CM

## 2023-03-31 DIAGNOSIS — Z1231 Encounter for screening mammogram for malignant neoplasm of breast: Secondary | ICD-10-CM

## 2023-03-31 NOTE — Progress Notes (Signed)
GYNECOLOGY ANNUAL PREVENTATIVE CARE ENCOUNTER NOTE  History:     Meagan Conway is a 63 y.o. PMP [phone] female here for a routine annual gynecologic exam.  Current complaints: none.   Denies abnormal vaginal bleeding, discharge, pelvic pain, problems with intercourse or other gynecologic concerns.    Gynecologic History No LMP recorded. Patient is postmenopausal. Last Pap: 03/24/2021. Result was normal with negative HPV Last Mammogram: 04/02/2021.  Result was normal Last Colonoscopy: 09/18/2021.  Result was normal  Obstetric History OB History  Gravida Para Term Preterm AB Living  2 2 2   2   SAB IAB Ectopic Multiple Live Births      2    # Outcome Date GA Lbr Len/2nd Weight Sex Type Anes PTL Lv  2 Term 1998    M Vag-Spont   LIV  1 Term 1988    M Vag-Spont   LIV    Past Medical History:  Diagnosis Date   Depression    with anxious features   GERD (gastroesophageal reflux disease)    History of gastrointestinal ulcer    followed by dr Allegra Lai; 09-18-2021 last colonoscopy w/ intestinal ulcer with ileitis / enteritis due to nsaid use  (bc/ goody powders   Hypertension 05/2005   H/O. no meds currently   IDA (iron deficiency anemia) 10/03/2018   hematologist--- dr Herma Carson. Cathie Hoops ([place])   Obesity    Paraesophageal hiatal hernia 2023   followed by dr Allegra Lai (GI);  last egd in epic 12-25-2021  large type III   Rectal vaginal fistula 1994   hx s/p reair 1999 after vaginal delivery , failed with continued chronic non-healing   Vertigo    last episode 03/18    Past Surgical History:  Procedure Laterality Date   COLONOSCOPY N/A 09/18/2021   Procedure: COLONOSCOPY;  Surgeon: Midge Minium, MD;  [facility];  Service: Endoscopy;  Laterality: N/A;   COLONOSCOPY WITH PROPOFOL N/A 08/27/2016   Procedure: COLONOSCOPY WITH PROPOFOL;  Surgeon: Midge Minium, MD;  Location: [facility];  Service: Endoscopy;  Laterality: N/A;   ESOPHAGOGASTRODUODENOSCOPY (EGD) WITH  PROPOFOL N/A 08/29/2017   Procedure: ESOPHAGOGASTRODUODENOSCOPY (EGD) WITH PROPOFOL;  Surgeon: Midge Minium, MD;  Location[facility];  Service: Endoscopy;  Laterality: N/A;  pt request early arrival time   ESOPHAGOGASTRODUODENOSCOPY (EGD) WITH PROPOFOL N/A 12/25/2021   Procedure: ESOPHAGOGASTRODUODENOSCOPY (EGD) WITH PROPOFOL;  Surgeon: Toney Reil, MD;  Location: [facility] END[facility];  Service: Gastroenterology;  Laterality: N/A;   GIVENS CAPSULE STUDY N/A 09/28/2017   Procedure: GIVENS CAPSULE STUDY;  Surgeon: Midge Minium, MD;  Location: [facility];  Service: Endoscopy;  Laterality: N/A;   HYSTEROSCOPY W/ ENDOMETRIAL ABLATION  2010   OVARIAN CYST SURGERY  08/14/2002   RECTO-VAGINAL FISSURE REPAIR  1999   SMALL BOWEL ENTEROSCOPY  12/15/2017   [facility]   VESICO-VAGINAL FISTULA REPAIR N/A 03/01/2022   Procedure: FISTULA REPAIR RECTO-VAGINAL/PERINEORRHAPHY;  Surgeon: Marguerita Beards, MD;  Location: [facility];  Service: Gynecology;  Laterality: N/A;    Current Outpatient Medications on File Prior to Visit  Medication Sig Dispense Refill   Cholecalciferol (VITAMIN D3) 50 MCG (2000 UT) TABS Take 1 tablet by mouth daily.     FLUoxetine (PROZAC) 20 MG capsule Take 1 capsule (20 mg total) by mouth daily. 90 capsule 3   Iron-Vitamin C (VITRON-C) 65-125 MG TABS Take 1 tablet by mouth daily. (Patient taking differently: Take 1 tablet by mouth every other day.) 90 tablet 3  lisinopril (ZESTRIL) 10 MG tablet Take 1 tablet (10 mg total) by mouth daily. 90 tablet 3   pantoprazole (PROTONIX) 40 MG tablet Take 1 tablet (40 mg total) by mouth daily. 30 minutes before breakfast 90 tablet 3   No current facility-administered medications on file prior to visit.    Allergies  Allergen Reactions   Nsaids     Per pt avoid due to intestinal ulcers    Social History:  reports that she quit smoking about 11 years ago. Her smoking use included cigarettes. She has never  used smokeless tobacco. She reports that she does not currently use alcohol after a past usage of about 3.0 standard drinks of alcohol per week. She reports that she does not use drugs.  Family History  Problem Relation Age of Onset   Hypertension Mother        died of GI bleed-unexpected    Hypertension Father    Heart disease Father 41       MI   Alcohol abuse Father    Heart disease Maternal Grandmother    Heart disease Paternal Grandmother     The following portions of the patient's history were reviewed and updated as appropriate: allergies, current medications, past family history, past medical history, past social history, past surgical history and problem list.  Review of Systems Pertinent items noted in HPI and remainder of comprehensive ROS otherwise negative.  Physical Exam:  BP 130/86   Pulse 63   Ht 5\' 4"  (1.626 m)   Wt 248 lb (112.5 kg)   BMI 42.57 kg/m  CONSTITUTIONAL: Well-developed, well-nourished female in no acute distress.  HENT:  Normocephalic, atraumatic, External right and left ear normal.  EYES: Conjunctivae and EOM are normal. Pupils are equal, round, and reactive to light. No scleral icterus.  NECK: Normal range of motion, supple, no masses.  Normal thyroid.  SKIN: Skin is warm and dry. No rash noted. Not diaphoretic. No erythema. No pallor. MUSCULOSKELETAL: Normal range of motion. No tenderness.  No cyanosis, clubbing, or edema. NEUROLOGIC: Alert and oriented to person, place, and time. Normal reflexes, muscle tone coordination.  PSYCHIATRIC: Normal mood and affect. Normal behavior. Normal judgment and thought content. CARDIOVASCULAR: Normal heart rate noted, regular rhythm RESPIRATORY: Clear to auscultation bilaterally. Effort and breath sounds normal, no problems with respiration noted. BREASTS: Symmetric in size. No masses, tenderness, skin changes, nipple drainage, or lymphadenopathy bilaterally. Performed in the presence of a chaperone. ABDOMEN:  Soft, no distention noted.  No tenderness, rebound or guarding.  PELVIC: Normal appearing external genitalia and urethral meatus; normal appearing vaginal mucosa and cervix with moderate atrophy.  No abnormal vaginal discharge noted.  Pap smear obtained.  Normal uterine size, no other palpable masses, no uterine or adnexal tenderness.  Performed in the presence of a chaperone.   Assessment and Plan:     1. Breast cancer screening by mammogram Mammogram scheduled for breast cancer screening. - MM 3D SCREENING MAMMOGRAM BILATERAL BREAST; Future  2. Well woman exam with routine gynecological exam (Primary) - Cytology - PAP Will follow up results of pap smear and manage accordingly. Colon cancer screening is up to date. Routine preventative health maintenance measures emphasized. Please refer to After Visit Summary for other counseling recommendations.      Jaynie Collins, MD, FACOG Obstetrician & Gynecologist, [facility] for [employer], [facility]

## 2023-04-06 ENCOUNTER — Encounter: Payer: Self-pay | Admitting: Obstetrics & Gynecology

## 2023-04-06 LAB — CYTOLOGY - PAP
Comment: NEGATIVE
Diagnosis: NEGATIVE
High risk HPV: NEGATIVE

## 2023-04-12 ENCOUNTER — Ambulatory Visit
Admission: RE | Admit: 2023-04-12 | Discharge: 2023-04-12 | Disposition: A | Discharge status: Home / Self Care | Payer: BC Managed Care – PPO | Source: Ambulatory Visit | Attending: Obstetrics & Gynecology | Admitting: Obstetrics & Gynecology

## 2023-04-12 DIAGNOSIS — Z1231 Encounter for screening mammogram for malignant neoplasm of breast: Secondary | ICD-10-CM | POA: Insufficient documentation

## 2023-04-13 ENCOUNTER — Inpatient Hospital Stay
Admission: RE | Admit: 2023-04-13 | Discharge: 2023-04-13 | Disposition: A | Discharge status: Home / Self Care | Payer: Self-pay | Source: Ambulatory Visit | Attending: Obstetrics & Gynecology | Admitting: Obstetrics & Gynecology

## 2023-04-13 ENCOUNTER — Other Ambulatory Visit: Payer: Self-pay | Admitting: *Deleted

## 2023-04-13 DIAGNOSIS — Z1231 Encounter for screening mammogram for malignant neoplasm of breast: Secondary | ICD-10-CM

## 2023-04-14 ENCOUNTER — Encounter: Payer: Self-pay | Admitting: Obstetrics & Gynecology

## 2023-10-07 ENCOUNTER — Encounter: Payer: Self-pay | Admitting: Family Medicine

## 2023-10-10 ENCOUNTER — Other Ambulatory Visit: Payer: Self-pay

## 2023-10-10 MED ORDER — FLUOXETINE HCL 20 MG PO CAPS: 20.0000 mg | ORAL_CAPSULE | Freq: Every day | ORAL | 0 refills | Status: DC

## 2023-10-10 MED ORDER — PANTOPRAZOLE SODIUM 40 MG PO TBEC: 40.0000 mg | DELAYED_RELEASE_TABLET | Freq: Every day | ORAL | 0 refills | Status: DC

## 2023-10-10 MED ORDER — LISINOPRIL 10 MG PO TABS: 10.0000 mg | ORAL_TABLET | Freq: Every day | ORAL | 0 refills | Status: DC

## 2023-11-18 ENCOUNTER — Other Ambulatory Visit: Payer: BC Managed Care – PPO

## 2023-11-25 ENCOUNTER — Encounter: Payer: BC Managed Care – PPO | Admitting: Family Medicine

## 2023-12-11 ENCOUNTER — Telehealth: Payer: Self-pay | Admitting: Family Medicine

## 2023-12-11 DIAGNOSIS — D509 Iron deficiency anemia, unspecified: Secondary | ICD-10-CM

## 2023-12-11 DIAGNOSIS — Z79899 Other long term (current) drug therapy: Secondary | ICD-10-CM

## 2023-12-11 DIAGNOSIS — R7303 Prediabetes: Secondary | ICD-10-CM

## 2023-12-11 DIAGNOSIS — I10 Essential (primary) hypertension: Secondary | ICD-10-CM

## 2023-12-11 NOTE — Telephone Encounter (Signed)
-----   Message from Veva JINNY Ferrari sent at 11/28/2023  3:39 PM EDT ----- Regarding: Lab orders for Mon, 9.29.25 Patient is scheduled for CPX labs, please order future labs, Thanks , Veva

## 2023-12-12 ENCOUNTER — Other Ambulatory Visit

## 2023-12-19 ENCOUNTER — Encounter: Admitting: Family Medicine

## 2024-02-20 ENCOUNTER — Telehealth: Payer: Self-pay | Admitting: *Deleted

## 2024-02-20 ENCOUNTER — Encounter: Payer: Self-pay | Admitting: Oncology

## 2024-02-20 ENCOUNTER — Telehealth: Payer: Self-pay | Admitting: Family Medicine

## 2024-02-20 NOTE — Telephone Encounter (Signed)
 Please schedule fasting labs prior to CPE, thanks

## 2024-02-20 NOTE — Telephone Encounter (Unsigned)
 Copied from CRM #[id number]. Topic: Clinical - Medication Refill >> Feb 20, 2024  3:50 PM Robinson H wrote: Medication: pantoprazole  (PROTONIX ) 40 MG tablet  Has the patient contacted their pharmacy? No, no refills (Agent: If no, request that the patient contact the pharmacy for the refill. If patient does not wish to contact the pharmacy document the reason why and proceed with request.) (Agent: If yes, when and what did the pharmacy advise?)  This is the patient's preferred pharmacy:   [facility] [id number] [place], [place] - [address] [phone] [address] [place] [address]  Is this the correct pharmacy for this prescription? Yes If no, delete pharmacy and type the correct one.   Has the prescription been filled recently? No  Is the patient out of the medication? Yes  Has the patient been seen for an appointment in the last year OR does the patient have an upcoming appointment? Yes  Can we respond through MyChart? Yes  Agent: Please be advised that Rx refills may take up to 3 business days. We ask that you follow-up with your pharmacy.

## 2024-02-20 NOTE — Telephone Encounter (Signed)
 Copied from CRM #[id number]. Topic: Clinical - Request for Lab/Test Order >> Feb 20, 2024  3:49 PM Robinson H wrote: Reason for CRM: Patient wants to have labs done prior to her physical scheduled on 1/13.  [place] [phone]

## 2024-02-21 MED ORDER — PANTOPRAZOLE SODIUM 40 MG PO TBEC: 40.0000 mg | DELAYED_RELEASE_TABLET | Freq: Every day | ORAL | 0 refills | Status: DC

## 2024-03-20 ENCOUNTER — Other Ambulatory Visit

## 2024-03-20 ENCOUNTER — Ambulatory Visit: Payer: Self-pay | Admitting: Family Medicine

## 2024-03-20 DIAGNOSIS — Z79899 Other long term (current) drug therapy: Secondary | ICD-10-CM | POA: Diagnosis not present

## 2024-03-20 DIAGNOSIS — I10 Essential (primary) hypertension: Secondary | ICD-10-CM

## 2024-03-20 DIAGNOSIS — R7303 Prediabetes: Secondary | ICD-10-CM | POA: Diagnosis not present

## 2024-03-20 DIAGNOSIS — D509 Iron deficiency anemia, unspecified: Secondary | ICD-10-CM

## 2024-03-20 LAB — COMPREHENSIVE METABOLIC PANEL WITH GFR
ALT: 22 U/L (ref 3–35)
AST: 20 U/L (ref 5–37)
Albumin: 4.4 g/dL (ref 3.5–5.2)
Alkaline Phosphatase: 74 U/L (ref 39–117)
BUN: 18 mg/dL (ref 6–23)
CO2: 29 meq/L (ref 19–32)
Calcium: 9.5 mg/dL (ref 8.4–10.5)
Chloride: 103 meq/L (ref 96–112)
Creatinine, Ser: 1.04 mg/dL (ref 0.40–1.20)
GFR: 58.17 mL/min — ABNORMAL LOW
Glucose, Bld: 108 mg/dL — ABNORMAL HIGH (ref 70–99)
Potassium: 4.5 meq/L (ref 3.5–5.1)
Sodium: 141 meq/L (ref 135–145)
Total Bilirubin: 0.5 mg/dL (ref 0.2–1.2)
Total Protein: 6.5 g/dL (ref 6.0–8.3)

## 2024-03-20 LAB — IRON: Iron: 130 ug/dL (ref 42–145)

## 2024-03-20 LAB — CBC WITH DIFFERENTIAL/PLATELET
Basophils Absolute: 0.1 K/uL (ref 0.0–0.1)
Basophils Relative: 0.9 % (ref 0.0–3.0)
Eosinophils Absolute: 0.2 K/uL (ref 0.0–0.7)
Eosinophils Relative: 2.4 % (ref 0.0–5.0)
HCT: 42.7 % (ref 36.0–46.0)
Hemoglobin: 14.2 g/dL (ref 12.0–15.0)
Lymphocytes Relative: 26.8 % (ref 12.0–46.0)
Lymphs Abs: 1.9 K/uL (ref 0.7–4.0)
MCHC: 33.3 g/dL (ref 30.0–36.0)
MCV: 97.6 fl (ref 78.0–100.0)
Monocytes Absolute: 0.4 K/uL (ref 0.1–1.0)
Monocytes Relative: 5.7 % (ref 3.0–12.0)
Neutro Abs: 4.6 K/uL (ref 1.4–7.7)
Neutrophils Relative %: 64.2 % (ref 43.0–77.0)
Platelets: 280 K/uL (ref 150.0–400.0)
RBC: 4.38 Mil/uL (ref 3.87–5.11)
RDW: 13.8 % (ref 11.5–15.5)
WBC: 7.1 K/uL (ref 4.0–10.5)

## 2024-03-20 LAB — TSH: TSH: 5.07 u[IU]/mL (ref 0.35–5.50)

## 2024-03-20 LAB — VITAMIN D 25 HYDROXY (VIT D DEFICIENCY, FRACTURES): VITD: 31.11 ng/mL (ref 30.00–100.00)

## 2024-03-20 LAB — LIPID PANEL
Cholesterol: 224 mg/dL — ABNORMAL HIGH (ref 28–200)
HDL: 86.9 mg/dL
LDL Cholesterol: 116 mg/dL — ABNORMAL HIGH (ref 10–99)
NonHDL: 136.98
Total CHOL/HDL Ratio: 3
Triglycerides: 104 mg/dL (ref 10.0–149.0)
VLDL: 20.8 mg/dL (ref 0.0–40.0)

## 2024-03-20 LAB — VITAMIN B12: Vitamin B-12: 350 pg/mL (ref 211–911)

## 2024-03-20 LAB — FERRITIN: Ferritin: 34.5 ng/mL (ref 10.0–291.0)

## 2024-03-20 LAB — HEMOGLOBIN A1C: Hgb A1c MFr Bld: 5.8 % (ref 4.6–6.5)

## 2024-03-26 ENCOUNTER — Encounter: Payer: Self-pay | Admitting: *Deleted

## 2024-03-27 ENCOUNTER — Ambulatory Visit: Admitting: Family Medicine

## 2024-03-27 ENCOUNTER — Encounter: Payer: Self-pay | Admitting: Family Medicine

## 2024-03-27 VITALS — BP 122/80 | HR 72 | Temp 97.7°F | Ht 64.0 in | Wt 236.5 lb

## 2024-03-27 DIAGNOSIS — K219 Gastro-esophageal reflux disease without esophagitis: Secondary | ICD-10-CM

## 2024-03-27 DIAGNOSIS — R7303 Prediabetes: Secondary | ICD-10-CM

## 2024-03-27 DIAGNOSIS — I10 Essential (primary) hypertension: Secondary | ICD-10-CM

## 2024-03-27 DIAGNOSIS — Z79899 Other long term (current) drug therapy: Secondary | ICD-10-CM | POA: Diagnosis not present

## 2024-03-27 DIAGNOSIS — Z0000 Encounter for general adult medical examination without abnormal findings: Secondary | ICD-10-CM | POA: Diagnosis not present

## 2024-03-27 DIAGNOSIS — D509 Iron deficiency anemia, unspecified: Secondary | ICD-10-CM | POA: Diagnosis not present

## 2024-03-27 DIAGNOSIS — D500 Iron deficiency anemia secondary to blood loss (chronic): Secondary | ICD-10-CM

## 2024-03-27 DIAGNOSIS — Z1211 Encounter for screening for malignant neoplasm of colon: Secondary | ICD-10-CM | POA: Diagnosis not present

## 2024-03-27 DIAGNOSIS — F5104 Psychophysiologic insomnia: Secondary | ICD-10-CM | POA: Diagnosis not present

## 2024-03-27 DIAGNOSIS — G4700 Insomnia, unspecified: Secondary | ICD-10-CM | POA: Insufficient documentation

## 2024-03-27 DIAGNOSIS — F341 Dysthymic disorder: Secondary | ICD-10-CM

## 2024-03-27 DIAGNOSIS — K633 Ulcer of intestine: Secondary | ICD-10-CM | POA: Diagnosis not present

## 2024-03-27 MED ORDER — PANTOPRAZOLE SODIUM 40 MG PO TBEC: 40.0000 mg | DELAYED_RELEASE_TABLET | Freq: Every day | ORAL | 3 refills | Status: AC

## 2024-03-27 MED ORDER — TRAZODONE HCL 50 MG PO TABS: 25.0000 mg | ORAL_TABLET | Freq: Every evening | ORAL | 0 refills | Status: AC | PRN

## 2024-03-27 MED ORDER — FLUOXETINE HCL 20 MG PO CAPS: 20.0000 mg | ORAL_CAPSULE | Freq: Every day | ORAL | 3 refills | Status: AC

## 2024-03-27 MED ORDER — LISINOPRIL 10 MG PO TABS: 10.0000 mg | ORAL_TABLET | Freq: Every day | ORAL | 3 refills | Status: AC

## 2024-03-27 NOTE — Assessment & Plan Note (Signed)
 Takes oral iron  every othe rday  Lab Results  Component Value Date   IRON  130 03/20/2024   TIBC 391 11/16/2022   FERRITIN 34.5 03/20/2024    Hgb of 14.2   Has had heme care in past

## 2024-03-27 NOTE — Assessment & Plan Note (Addendum)
 Noted on last colonoscopy 09/2021  Her provider left the practice We will try and find out when follow up is due  No symptoms at all

## 2024-03-27 NOTE — Assessment & Plan Note (Signed)
 Reviewed health habits including diet and exercise and skin cancer prevention Reviewed appropriate screening tests for age  Also reviewed health mt list, fam hx and immunization status , as well as social and family history   See HPI Labs reviewed and ordered Health Maintenance  Topic Date Due   HIV Screening  Never done   Pneumococcal Vaccine for age over 75 (1 of 1 - PCV) Never done   Breast Cancer Screening  04/11/2024   Flu Shot  06/12/2024*   COVID-19 Vaccine (1) 09/10/2024*   Zoster (Shingles) Vaccine (1 of 2) 06/25/2025*   DTaP/Tdap/Td vaccine (3 - Td or Tdap) 01/06/2025   Pap with HPV screening  03/30/2028   Colon Cancer Screening  09/19/2031   Hepatitis C Screening  Completed   Hepatitis B Vaccine  Aged Out   HPV Vaccine  Aged Out   Meningitis B Vaccine  Aged Out  *Topic was postponed. The date shown is not the original due date.   Declines flu and shingrix vaccines  Gyn follow up and mammogram- being planned/reminded pt to make the appointment  Will check in with [facility] to see when follow up is needed  PHQ 2

## 2024-03-27 NOTE — Patient Instructions (Addendum)
 Call your gyn office to get mammogram ordered/scheduled   Get into an exercise routine  Add some strength training to your routine, this is important for bone and brain health and can reduce your risk of falls and help your body use insulin properly and regulate weight  Light weights, exercise bands , and internet videos are a good way to start  Yoga (chair or regular), machines , floor exercises or a gym with machines are also good options    For general health Avoid added sugars in your diet when you can  Try to get most of your carbohydrates from produce (with the exception of white potatoes) and whole grains Eat less bread/pasta/rice/snack foods/cereals/sweets and other items from the middle of the grocery store (processed carbs)  Protein  The following are examples of protein in diet  Meat lean  Fish  Eggs  Dairy products  Soy products  Oat milk  Almond milk Legumes  Nuts and nut butters  Dried beans     We will reach out to GI to see when you need a follow up   Look up the [facility] on line  Let us  know if you want a referral   Try trazodone  for sleep  25-50 mg an hour before bed   If any side effects stop it and let us  know   Try to back off the wine

## 2024-03-27 NOTE — Assessment & Plan Note (Signed)
 bp in fair control at this time  BP Readings from Last 1 Encounters:  03/27/24 122/80   No changes needed Most recent labs reviewed  Disc lifstyle change with low sodium diet and exercise  Plan to continue lisinopril  10 mg daily

## 2024-03-27 NOTE — Assessment & Plan Note (Signed)
 Continues fluoxetine  20 mg daily which is helpful  Reviewed stressors/ coping techniques/symptoms/ support sources/ tx options and side effects in detail today Declines counseling  Retirement has helped   Struggles with sleep  Discussed sleep hygiene   Prescription trazodone  to try 25-50 mg at bedtime and report back

## 2024-03-27 NOTE — Assessment & Plan Note (Signed)
 Lab Results  Component Value Date   HGBA1C 5.8 03/20/2024   HGBA1C 5.7 10/06/2022   HGBA1C 5.9 08/19/2021   disc imp of low glycemic diet and wt loss to prevent DM2  Also weight loss

## 2024-03-27 NOTE — Assessment & Plan Note (Signed)
 Colonoscopy 2023-had ulcers  Unsure when follow up at [facility] is supposed to be

## 2024-03-27 NOTE — Assessment & Plan Note (Signed)
 Continues pantopraole 40 mg daily  Monitoring B12 Avoids triggers

## 2024-03-27 NOTE — Progress Notes (Unsigned)
 "  Subjective:    Patient ID: Meagan Conway, female    DOB: 04/11/1960, 64 y.o.   MRN: [id number]  HPI  Here for health maintenance exam and to review chronic medical problems   Wt Readings from Last 3 Encounters:  03/27/24 236 lb 8 oz (107.3 kg)  03/31/23 248 lb (112.5 kg)  11/24/22 232 lb 6 oz (105.4 kg)   40.60 kg/m  Vitals:   03/27/24 0918 03/27/24 0951  BP: 136/80 122/80  Pulse: 72   Temp: 97.7 F (36.5 C)   SpO2: 99%     Immunization History  Administered Date(s) Administered   Influenza Split 02/16/2011   Influenza,inj,Quad PF,6+ Mos 01/07/2015   Influenza-Unspecified 12/14/2019   PPD Test 07/08/2015   Td 11/15/2006   Tdap 01/07/2015    Health Maintenance Due  Topic Date Due   HIV Screening  Never done   Pneumococcal Vaccine: 50+ Years (1 of 1 - PCV) Never done   Mammogram  04/11/2024    Needs to take better care of herself  Health has not been her priority   Weight is down a bit  Protein shake in am  Eating less in general    Declines flu shot   Shingrix - not interested   Mammogram 03/2023- her gyn schedules that  Self breast exam-no lump   Gyn health Sees gyn provider  Pap 03/2023 No complaints   Colon cancer screening  Colonoscopy 09/2021 (had ulcers in colon)  No GI complaints  Bone health   Falls-none  Fractures-none  Supplements - D3  2000 international units daily  Last vitamin D  Lab Results  Component Value Date   VD25OH 31.11 03/20/2024    Exercise  Getting ready to join the [employer] water  aerobics   Is due for derm appointment  Is good about sun protection    Mood    03/27/2024    9:23 AM 03/31/2023    3:04 PM 11/24/2022    4:21 PM 08/17/2019    3:46 PM 07/18/2017    4:02 PM  Depression screen PHQ 2/9  Decreased Interest 0 0 1 0 1  Down, Depressed, Hopeless 0 0 1 0 1  PHQ - 2 Score 0 0 2 0 2  Altered sleeping 1 1 1 1 1   Tired, decreased energy 1 1 0 1 1  Change in appetite 0 0 0 0 1  Feeling bad or failure  about yourself  0 0 1 0 0  Trouble concentrating 0 0 1 0 0  Moving slowly or fidgety/restless 0 0 0 0 0  Suicidal thoughts 0 0 0 0 0  PHQ-9 Score 2 2  5  2  5    Difficult doing work/chores Not difficult at all Not difficult at all Somewhat difficult Not difficult at all      Data saved with a previous flowsheet row definition   Anxiety with depression  Takes fluoxetine  20 mg daily  DIL is her biggest stress   Cannot sleep with a few glasses of wine  Takes hours to fall asleep without it    Did retire  Doing great  A lot less stress Husband retired also     HTN bp is stable today  No cp or palpitations or headaches or edema  No side effects to medicines  BP Readings from Last 3 Encounters:  03/27/24 122/80  03/31/23 130/86  03/17/23 136/88    Lisinopril  10 mg daily   Lab Results  Component Value  Date   NA 141 03/20/2024   K 4.5 03/20/2024   CO2 29 03/20/2024   GLUCOSE 108 (H) 03/20/2024   BUN 18 03/20/2024   CREATININE 1.04 03/20/2024   CALCIUM 9.5 03/20/2024   GFR 58.17 (L) 03/20/2024   GFRNONAA >60 06/09/2016    Water  intake  GFR is down slightly  At least 48 oz of water  daily   GERD Pantoprazole  40 mg daily -works well   Lab Results  Component Value Date   VITAMINB12 350 03/20/2024    Iron  def anemia  Lab Results  Component Value Date   WBC 7.1 03/20/2024   HGB 14.2 03/20/2024   HCT 42.7 03/20/2024   MCV 97.6 03/20/2024   PLT 280.0 03/20/2024   Takes iron  every other day  Heme care  Lab Results  Component Value Date   IRON  130 03/20/2024   TIBC 391 11/16/2022   FERRITIN 34.5 03/20/2024     Prediabetes Lab Results  Component Value Date   HGBA1C 5.8 03/20/2024   HGBA1C 5.7 10/06/2022   HGBA1C 5.9 08/19/2021    Cholesterol Lab Results  Component Value Date   CHOL 224 (H) 03/20/2024   CHOL 215 (H) 10/06/2022   CHOL 184 08/19/2021   Lab Results  Component Value Date   HDL 86.90 03/20/2024   HDL 89.00 10/06/2022   HDL  77.10 08/19/2021   Lab Results  Component Value Date   LDLCALC 116 (H) 03/20/2024   LDLCALC 110 (H) 10/06/2022   LDLCALC 93 08/19/2021   Lab Results  Component Value Date   TRIG 104.0 03/20/2024   TRIG 80.0 10/06/2022   TRIG 68.0 08/19/2021   Lab Results  Component Value Date   CHOLHDL 3 03/20/2024   CHOLHDL 2 10/06/2022   CHOLHDL 2 08/19/2021   No results found for: LDLDIRECT  The 10-year ASCVD risk score (Arnett DK, et al., 2019) is: 3.7%   Values used to calculate the score:     Age: 57 years     Clinically relevant sex: Female     Is Non-Hispanic African American: No     Diabetic: No     Tobacco smoker: No     Systolic Blood Pressure: 122 mmHg     Is BP treated: Yes     HDL Cholesterol: 86.9 mg/dL     Total Cholesterol: 224 mg/dL    Lab Results  Component Value Date   TSH 5.07 03/20/2024   Lab Results  Component Value Date   ALT 22 03/20/2024   AST 20 03/20/2024   ALKPHOS 74 03/20/2024   BILITOT 0.5 03/20/2024       Patient Active Problem List   Diagnosis Date Noted   Insomnia 03/27/2024   Paraesophageal hiatal hernia    Current use of proton pump inhibitor 08/25/2020   IDA (iron  deficiency anemia) 10/03/2018   Special screening for malignant neoplasms, colon    Ulceration of intestine    Prediabetes 07/04/2013   Routine general medical examination at a health care facility 06/04/2011   GERD (gastroesophageal reflux disease) 03/10/2011   Morbid obesity (HCC) 03/10/2011   COLONIC POLYPS 04/22/2010   ANXIETY DEPRESSION 02/28/2008   Hypertension 01/15/2008   Past Medical History:  Diagnosis Date   Anxiety    Depression    with anxious features   GERD (gastroesophageal reflux disease)    History of gastrointestinal ulcer    followed by dr unk; 09-18-2021 last colonoscopy w/ intestinal ulcer with ileitis / enteritis due to nsaid use  (  bc/ goody powders   Hypertension 05/2005   H/O. no meds currently   IDA (iron  deficiency anemia)  10/03/2018   hematologist--- dr jeneane. babara ([place])   Obesity    Paraesophageal hiatal hernia 2023   followed by dr unk (GI);  last egd in epic 12-25-2021  large type III   Rectal vaginal fistula 1994   hx s/p reair 1999 after vaginal delivery , failed with continued chronic non-healing   Vertigo    last episode 03/18   Past Surgical History:  Procedure Laterality Date   COLONOSCOPY N/A 09/18/2021   Procedure: COLONOSCOPY;  Surgeon: Jinny Carmine, MD;  Location: [facility];  Service: Endoscopy;  Laterality: N/A;   COLONOSCOPY WITH PROPOFOL  N/A 08/27/2016   Procedure: COLONOSCOPY WITH PROPOFOL ;  Surgeon: Jinny Carmine, MD;  Location: [facility];  Service: Endoscopy;  Laterality: N/A;   ESOPHAGOGASTRODUODENOSCOPY (EGD) WITH PROPOFOL  N/A 08/29/2017   Procedure: ESOPHAGOGASTRODUODENOSCOPY (EGD) WITH PROPOFOL ;  Surgeon: Jinny Carmine, MD;  Location: [facility];  Service: Endoscopy;  Laterality: N/A;  pt request early arrival time   ESOPHAGOGASTRODUODENOSCOPY (EGD) WITH PROPOFOL  N/A 12/25/2021   Procedure: ESOPHAGOGASTRODUODENOSCOPY (EGD) WITH PROPOFOL ;  Surgeon: Unk Corinn Skiff, MD;  Location: [facility] END[facility]COPY;  Service: Gastroenterology;  Laterality: N/A;   GIVENS CAPSULE STUDY N/A 09/28/2017   Procedure: GIVENS CAPSULE STUDY;  Surgeon: Jinny Carmine, MD;  Location: [facility];  Service: Endoscopy;  Laterality: N/A;   HYSTEROSCOPY W/ ENDOMETRIAL ABLATION  2010   OVARIAN CYST SURGERY  08/14/2002   RECTO-VAGINAL FISSURE REPAIR  1999   SMALL BOWEL ENTEROSCOPY  12/15/2017   @[facility]   VESICO-VAGINAL FISTULA REPAIR N/A 03/01/2022   Procedure: FISTULA REPAIR RECTO-VAGINAL/PERINEORRHAPHY;  Surgeon: Marilynne Rosaline SAILOR, MD;  Location: [facility];  Service: Gynecology;  Laterality: N/A;   Social History[1] Family History  Problem Relation Age of Onset   Hypertension Mother        died of GI bleed-unexpected    Hypertension Father    Heart disease  Father 57       MI   Alcohol abuse Father    Heart disease Maternal Grandmother    Heart disease Paternal Grandmother    Heart disease Maternal Grandfather    Heart disease Paternal Grandfather    Breast cancer Neg Hx    Allergies[2] Medications Ordered Prior to Encounter[3]  Review of Systems     Objective:   Physical Exam        Assessment & Plan:   Problem List Items Addressed This Visit       Cardiovascular and Mediastinum   Hypertension   bp in fair control at this time  BP Readings from Last 1 Encounters:  03/27/24 122/80   No changes needed Most recent labs reviewed  Disc lifstyle change with low sodium diet and exercise  Plan to continue lisinopril  10 mg daily      Relevant Medications   lisinopril  (ZESTRIL ) 10 MG tablet     Digestive   Ulceration of intestine   Noted on last colonoscopy 09/2021  Her provider left the practice We will try and find out when follow up is due  No symptoms at all      GERD (gastroesophageal reflux disease)   Continues pantopraole 40 mg daily  Monitoring B12 Avoids triggers       Relevant Medications   pantoprazole  (PROTONIX ) 40 MG tablet     Other   Special screening for malignant neoplasms, colon   Colonoscopy 2023-had ulcers  Unsure when follow up at [facility] is supposed to be       Routine general medical examination at a health care facility - Primary   Reviewed health habits including diet and exercise and skin cancer prevention Reviewed appropriate screening tests for age  Also reviewed health mt list, fam hx and immunization status , as well as social and family history   See HPI Labs reviewed and ordered Health Maintenance  Topic Date Due   HIV Screening  Never done   Pneumococcal Vaccine for age over 23 (1 of 1 - PCV) Never done   Breast Cancer Screening  04/11/2024   Flu Shot  06/12/2024*   COVID-19 Vaccine (1) 09/10/2024*   Zoster (Shingles) Vaccine (1 of 2) 06/25/2025*   DTaP/Tdap/Td  vaccine (3 - Td or Tdap) 01/06/2025   Pap with HPV screening  03/30/2028   Colon Cancer Screening  09/19/2031   Hepatitis C Screening  Completed   Hepatitis B Vaccine  Aged Out   HPV Vaccine  Aged Out   Meningitis B Vaccine  Aged Out  *Topic was postponed. The date shown is not the original due date.   Declines flu and shingrix vaccines  Gyn follow up and mammogram- being planned/reminded pt to make the appointment  Will check in with [facility] to see when follow up is needed  PHQ 2        Prediabetes   Lab Results  Component Value Date   HGBA1C 5.8 03/20/2024   HGBA1C 5.7 10/06/2022   HGBA1C 5.9 08/19/2021   disc imp of low glycemic diet and wt loss to prevent DM2  Also weight loss       Morbid obesity (HCC)   Discussed how this problem influences overall health and the risks it imposes  Reviewed plan for weight loss with lower calorie diet (via better food choices (lower glycemic and portion control) along with exercise building up to or more than 30 minutes 5 days per week including some aerobic activity and strength training    Discussed need for exercise  Given option of healthy weight clinic also-she will do some research and reach out if interested       Insomnia   Trial of trazodone  25-50 mg daily prn   Discussed sleep hygiene Exercise would help       IDA (iron  deficiency anemia)   Takes oral iron  every othe rday  Lab Results  Component Value Date   IRON  130 03/20/2024   TIBC 391 11/16/2022   FERRITIN 34.5 03/20/2024    Hgb of 14.2   Has had heme care in past       Current use of proton pump inhibitor   Lab Results  Component Value Date   VITAMINB12 350 03/20/2024   Will continue to monitor   On protonix  40 mg daily Encouraged good hydration for renal health       ANXIETY DEPRESSION   Continues fluoxetine  20 mg daily which is helpful  Reviewed stressors/ coping techniques/symptoms/ support sources/ tx options and side effects in detail  today Declines counseling  Retirement has helped   Struggles with sleep  Discussed sleep hygiene   Prescription trazodone  to try 25-50 mg at bedtime and report back       Relevant Medications   FLUoxetine  (PROZAC ) 20 MG capsule   traZODone  (DESYREL ) 50 MG tablet      [1]  Social History Tobacco Use   Smoking status: Former    Current packs/day:  0.00    Average packs/day: 0.3 packs/day for 3.0 years (0.8 ttl pk-yrs)    Types: Cigarettes    Quit date: 2014    Years since quitting: 12.0   Smokeless tobacco: Never  Vaping Use   Vaping status: Never Used  Substance Use Topics   Alcohol use: Yes    Alcohol/week: 6.0 standard drinks of alcohol    Types: 4 Glasses of wine, 2 Cans of beer per week   Drug use: Never  [2]  Allergies Allergen Reactions   Nsaids     Per pt avoid due to intestinal ulcers  [3]  Current Outpatient Medications on File Prior to Visit  Medication Sig Dispense Refill   Cholecalciferol (VITAMIN D3) 50 MCG (2000 UT) TABS Take 1 tablet by mouth daily.     Iron -Vitamin C (VITRON-C) 65-125 MG TABS Take 1 tablet by mouth daily. (Patient taking differently: Take 1 tablet by mouth every other day.) 90 tablet 3   No current facility-administered medications on file prior to visit.   "

## 2024-03-27 NOTE — Assessment & Plan Note (Signed)
 Discussed how this problem influences overall health and the risks it imposes  Reviewed plan for weight loss with lower calorie diet (via better food choices (lower glycemic and portion control) along with exercise building up to or more than 30 minutes 5 days per week including some aerobic activity and strength training    Discussed need for exercise  Given option of healthy weight clinic also-she will do some research and reach out if interested

## 2024-03-27 NOTE — Assessment & Plan Note (Signed)
 Lab Results  Component Value Date   VITAMINB12 350 03/20/2024   Will continue to monitor   On protonix  40 mg daily Encouraged good hydration for renal health

## 2024-03-27 NOTE — Assessment & Plan Note (Signed)
 Trial of trazodone  25-50 mg daily prn   Discussed sleep hygiene Exercise would help

## 2024-04-09 ENCOUNTER — Telehealth: Payer: Self-pay | Admitting: *Deleted

## 2024-04-09 NOTE — Telephone Encounter (Signed)
 Pt notified of Dr. Graham comments and what GI said. She will call and schedule a f/u now

## 2024-04-09 NOTE — Telephone Encounter (Signed)
-----   Message from Laine Balls, MD sent at 03/29/2024  4:10 PM EST ----- Please call and let her know that -encouraged her to call Thanks   Let me know if you need me to do anything ----- Message ----- From: Rayleen Glendora HERO, CMA Sent: 03/29/2024   2:20 PM EST To: Laine DELENA Balls, MD  I checked with Andrea, CMA and she said that pt had a f/u scheduled in 01/24 to discuss results and make a plan with GI provider but pt cancelled the appt and never r/s. She said pt can call and schedule a TOC appt since Dr. Unk has left practice but if she doesn't do it soon then she will be considered a new pt and would need a new referral since they last saw her in 2023 ----- Message ----- From: Balls Laine DELENA, MD Sent: 03/28/2024   7:45 PM EST To: Glendora HERO Rayleen, CMA  Can someone check with her prior GI practice and see when she is due for follow up?   The last colonoscopy had ulcerations and I don't see a plan after that  Thanks!

## 2024-05-03 ENCOUNTER — Ambulatory Visit: Admitting: Family Medicine

## 2024-05-15 ENCOUNTER — Ambulatory Visit: Admitting: Obstetrics & Gynecology
